# Patient Record
Sex: Female | Born: 1955
Health system: Southern US, Community
[De-identification: ages and names within clinical notes are randomized; demographics above are authoritative.]

## PROBLEM LIST (undated history)

## (undated) HISTORY — PX: APPENDECTOMY: SHX54

---

## 1999-04-14 ENCOUNTER — Encounter: Payer: Self-pay | Admitting: Family Medicine

## 1999-04-14 ENCOUNTER — Ambulatory Visit (HOSPITAL_COMMUNITY): Admission: RE | Admit: 1999-04-14 | Discharge: 1999-04-14 | Payer: Self-pay | Admitting: Family Medicine

## 1999-04-26 ENCOUNTER — Other Ambulatory Visit: Admission: RE | Admit: 1999-04-26 | Discharge: 1999-04-26 | Payer: Self-pay | Admitting: Gynecology

## 2000-04-25 ENCOUNTER — Other Ambulatory Visit: Admission: RE | Admit: 2000-04-25 | Discharge: 2000-04-25 | Payer: Self-pay | Admitting: Gynecology

## 2000-05-11 ENCOUNTER — Ambulatory Visit (HOSPITAL_COMMUNITY): Admission: RE | Admit: 2000-05-11 | Discharge: 2000-05-11 | Payer: Self-pay | Admitting: Family Medicine

## 2000-05-11 ENCOUNTER — Encounter: Payer: Self-pay | Admitting: Family Medicine

## 2000-05-16 ENCOUNTER — Encounter: Payer: Self-pay | Admitting: Family Medicine

## 2000-05-16 ENCOUNTER — Encounter: Admission: RE | Admit: 2000-05-16 | Discharge: 2000-05-16 | Payer: Self-pay | Admitting: Family Medicine

## 2000-11-13 ENCOUNTER — Encounter: Payer: Self-pay | Admitting: Family Medicine

## 2000-11-13 ENCOUNTER — Encounter: Admission: RE | Admit: 2000-11-13 | Discharge: 2000-11-13 | Payer: Self-pay | Admitting: Family Medicine

## 2000-11-26 ENCOUNTER — Other Ambulatory Visit: Admission: RE | Admit: 2000-11-26 | Discharge: 2000-11-26 | Payer: Self-pay | Admitting: Gynecology

## 2001-04-29 ENCOUNTER — Other Ambulatory Visit: Admission: RE | Admit: 2001-04-29 | Discharge: 2001-04-29 | Payer: Self-pay | Admitting: Gynecology

## 2002-04-28 ENCOUNTER — Other Ambulatory Visit: Admission: RE | Admit: 2002-04-28 | Discharge: 2002-04-28 | Payer: Self-pay | Admitting: Gynecology

## 2002-12-19 ENCOUNTER — Encounter: Payer: Self-pay | Admitting: Family Medicine

## 2002-12-19 ENCOUNTER — Ambulatory Visit (HOSPITAL_COMMUNITY): Admission: RE | Admit: 2002-12-19 | Discharge: 2002-12-19 | Payer: Self-pay | Admitting: Family Medicine

## 2003-06-01 ENCOUNTER — Other Ambulatory Visit: Admission: RE | Admit: 2003-06-01 | Discharge: 2003-06-01 | Payer: Self-pay | Admitting: Gynecology

## 2004-01-13 ENCOUNTER — Ambulatory Visit (HOSPITAL_COMMUNITY): Admission: RE | Admit: 2004-01-13 | Discharge: 2004-01-13 | Payer: Self-pay | Admitting: Gynecology

## 2004-06-06 ENCOUNTER — Other Ambulatory Visit: Admission: RE | Admit: 2004-06-06 | Discharge: 2004-06-06 | Payer: Self-pay | Admitting: Gynecology

## 2005-02-20 ENCOUNTER — Ambulatory Visit (HOSPITAL_COMMUNITY): Admission: RE | Admit: 2005-02-20 | Discharge: 2005-02-20 | Payer: Self-pay | Admitting: Family Medicine

## 2005-07-18 ENCOUNTER — Other Ambulatory Visit: Admission: RE | Admit: 2005-07-18 | Discharge: 2005-07-18 | Payer: Self-pay | Admitting: Gynecology

## 2006-02-23 ENCOUNTER — Encounter: Admission: RE | Admit: 2006-02-23 | Discharge: 2006-02-23 | Payer: Self-pay | Admitting: Family Medicine

## 2006-08-28 ENCOUNTER — Other Ambulatory Visit: Admission: RE | Admit: 2006-08-28 | Discharge: 2006-08-28 | Payer: Self-pay | Admitting: Gynecology

## 2007-02-25 ENCOUNTER — Ambulatory Visit (HOSPITAL_COMMUNITY): Admission: RE | Admit: 2007-02-25 | Discharge: 2007-02-25 | Payer: Self-pay | Admitting: Gynecology

## 2007-03-01 ENCOUNTER — Encounter: Admission: RE | Admit: 2007-03-01 | Discharge: 2007-03-01 | Payer: Self-pay | Admitting: Gynecology

## 2007-09-26 ENCOUNTER — Other Ambulatory Visit: Admission: RE | Admit: 2007-09-26 | Discharge: 2007-09-26 | Payer: Self-pay | Admitting: Gynecology

## 2007-10-02 ENCOUNTER — Encounter: Admission: RE | Admit: 2007-10-02 | Discharge: 2007-10-02 | Payer: Self-pay | Admitting: Gynecology

## 2008-03-09 ENCOUNTER — Encounter: Admission: RE | Admit: 2008-03-09 | Discharge: 2008-03-09 | Payer: Self-pay | Admitting: Family Medicine

## 2009-03-12 ENCOUNTER — Encounter: Admission: RE | Admit: 2009-03-12 | Discharge: 2009-03-12 | Payer: Self-pay | Admitting: Family Medicine

## 2009-08-04 ENCOUNTER — Other Ambulatory Visit: Admission: RE | Admit: 2009-08-04 | Discharge: 2009-08-04 | Payer: Self-pay | Admitting: Family Medicine

## 2010-03-14 ENCOUNTER — Ambulatory Visit (HOSPITAL_COMMUNITY): Admission: RE | Admit: 2010-03-14 | Discharge: 2010-03-14 | Payer: Self-pay | Admitting: Gynecology

## 2011-09-26 ENCOUNTER — Encounter: Payer: Self-pay | Admitting: Sports Medicine

## 2011-09-26 ENCOUNTER — Ambulatory Visit (INDEPENDENT_AMBULATORY_CARE_PROVIDER_SITE_OTHER): Payer: BC Managed Care – PPO | Admitting: Sports Medicine

## 2011-09-26 VITALS — BP 118/77 | HR 78 | Ht 66.5 in | Wt 138.0 lb

## 2011-09-26 DIAGNOSIS — M25579 Pain in unspecified ankle and joints of unspecified foot: Secondary | ICD-10-CM

## 2011-09-26 NOTE — Progress Notes (Signed)
  Subjective:    Patient ID: Annette Kidd, female    DOB: Jul 02, 1955, 56 y.o.   MRN: [id number]  HPI Patient is active and likes to hike and do garden work. She is doing yoga for a number of years. A few years ago she had plantar fasciitis in the right foot but this did resolve with using a boot, stretching exercises and a number of different treatments. This took several months to resolve. Other than that she has had occasional right ankle injuries with some swelling but none that she thought were too serious.  Lately she has pain that radiates down the outside of her right leg particularly around the outside of her ankle and the outside of her foot. It is not associated with numbness or tingling. It does get worse after significant walking or hiking with a backpack or working with a shovel.  She does not note much swelling.   Review of Systems     Objective:   Physical Exam  No acute distress  RT Ankle: No visible erythema or swelling. Range of motion is full in all directions but is increased in both inversion and plantar flexion Strength is 5/5 in all directions. Unstable lateral joint capsule although the ATF is normal  Stable medial ligaments; squeeze test and kleiger test unremarkable; Talar dome nontender;  No pain at base of 5th MT; No tenderness over cuboid; No tenderness over N spot or navicular prominence No tenderness on posterior aspects of lateral and medial malleolus No sign of peroneal tendon subluxations; Negative tarsal tunnel tinel's Able to walk 4 steps.       Assessment & Plan:

## 2011-09-26 NOTE — Patient Instructions (Signed)
Use ankle compression sleeve for walking, prolonged standing and yoga  Get some new walking shoes and get rid of worn boot  Start ankle stabilization exercises daily - twice if you get time  You are getting chronic stretch of peroneal tendons and of the peroneal nerve that causes your pain and symptoms to your outer foot and leg  Work this daily for 4 to 6 weeks and then let me recheck to see if ankle is more stable

## 2011-09-26 NOTE — Assessment & Plan Note (Signed)
Patient has an unstable right ankle but I think gives her a strain of the perineal muscles and perhaps some traction irritation to the peroneal nerves  We gave her a compression sleeve in a series of exercises  I want her to improve her shoe wear  Recheck in 4-6 weeks

## 2011-11-14 ENCOUNTER — Ambulatory Visit (INDEPENDENT_AMBULATORY_CARE_PROVIDER_SITE_OTHER): Payer: BC Managed Care – PPO | Admitting: Sports Medicine

## 2011-11-14 VITALS — BP 135/74

## 2011-11-14 DIAGNOSIS — M25579 Pain in unspecified ankle and joints of unspecified foot: Secondary | ICD-10-CM

## 2011-11-14 NOTE — Assessment & Plan Note (Signed)
This is clearly improved  However I think she should use some ankle support long term but only for vigorous exercise  keep up exercises 3 x week She can see me prn  Copy to Dr Clelia Croft

## 2011-11-14 NOTE — Progress Notes (Signed)
  Subjective:    Patient ID: Annette Kidd, female    DOB: 01-Jun-1955, 56 y.o.   MRN: [id number]  HPI Very little pain, does exercises 2x/day for while, less more recently Has continued to walk daily Able to hike, walk and do yoga using compression sleeve with very little pain Feels that balance has improved with eyes closed   Review of Systems     Objective:   Physical Exam RT ankle No swelling, no direct tenderness, still has 25 degrees of excess inversion of right ankle, but normal dorsal flexion, plantar flexion, and eversion Right outer talar dome comes out of mortise with inversin and PF Drawer test is negative     Assessment & Plan:  Continue balance exercises at least 3x per week Use compression sleeve during activity

## 2012-03-12 ENCOUNTER — Ambulatory Visit: Payer: BC Managed Care – PPO | Admitting: Sports Medicine

## 2012-05-24 ENCOUNTER — Ambulatory Visit (INDEPENDENT_AMBULATORY_CARE_PROVIDER_SITE_OTHER): Payer: Self-pay | Admitting: General Surgery

## 2012-09-17 ENCOUNTER — Ambulatory Visit (INDEPENDENT_AMBULATORY_CARE_PROVIDER_SITE_OTHER): Payer: Managed Care, Other (non HMO) | Admitting: Sports Medicine

## 2012-09-17 ENCOUNTER — Encounter: Payer: Self-pay | Admitting: Sports Medicine

## 2012-09-17 VITALS — BP 117/70 | HR 82 | Ht 66.5 in | Wt 138.0 lb

## 2012-09-17 DIAGNOSIS — M2170 Unequal limb length (acquired), unspecified site: Secondary | ICD-10-CM

## 2012-09-17 DIAGNOSIS — M25571 Pain in right ankle and joints of right foot: Secondary | ICD-10-CM

## 2012-09-17 DIAGNOSIS — M25579 Pain in unspecified ankle and joints of unspecified foot: Secondary | ICD-10-CM

## 2012-09-17 NOTE — Assessment & Plan Note (Signed)
RT ankle laxity is still increased on RT but less than noted before Restart ankle exercises  Early arch breakdown with fibromas on RT PF  Suspect this is worse on RT 2/2 short leg  Insoles, arch straps, PF exercises

## 2012-09-17 NOTE — Patient Instructions (Addendum)
Please do suggested exercises for your ankles and feet  Try arch bands and insoles with leg length correction  Please follow up in 4-6 weeks  Thank you for seeing [place] today!

## 2012-09-17 NOTE — Progress Notes (Signed)
  Subjective:    Patient ID: Annette Kidd, female    DOB: [birth year], 57 y.o.   MRN: [id number]  HPI  Pt presents to clinic for rt lateral foot and heel pain. Has lump in arches of both feet, lt foot morton's neuroma that feels "weird", not painful.  Also having rt posterior hip pain down back of leg.  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 States discomfort is constant, worse with increased activity.  Doing plantar fasciitis stretches and rolling foot on frozen water bottle.    Review of Systems     Objective:   Physical Exam  NAD  Plantar fibroma medial aspect on rt Thickening of PF on lt  Cavus foot, loss long arch bilat Hypertrophy of MTP joint with small bunionettes Calcaneus neutral bilat Post tib function good bilat  Calves are symmetric  Lt leg 1.5 cm shorter than rt  Straight leg raise neg bilat- tighter on rt FABER good bilat SI joints move freely  Medial aspect of PF on rt - not ttp  Herniations of fat pad on rt heel L 5-S1 strength normal Rt ankle looser than lt  Peroneal strength good bilat Hip abduction strength good bilat  No scoliosis   Walking gait with trendelenberg to left  Walking gait- trendelenburg to the left      Assessment & Plan:

## 2012-09-17 NOTE — Assessment & Plan Note (Signed)
Lift added to RT  Gait pattern was improved P this  Trial with support for 1 month  If helpful start with lift in other shoes Use the insoles with lift for walking, exercise  Reck 6 weeks

## 2012-10-22 ENCOUNTER — Ambulatory Visit: Payer: Managed Care, Other (non HMO) | Admitting: Sports Medicine

## 2012-11-06 ENCOUNTER — Ambulatory Visit (INDEPENDENT_AMBULATORY_CARE_PROVIDER_SITE_OTHER): Payer: Managed Care, Other (non HMO) | Admitting: Sports Medicine

## 2012-11-06 VITALS — BP 126/77 | Ht 66.0 in | Wt 138.0 lb

## 2012-11-06 DIAGNOSIS — M2170 Unequal limb length (acquired), unspecified site: Secondary | ICD-10-CM

## 2012-11-06 DIAGNOSIS — M25579 Pain in unspecified ankle and joints of unspecified foot: Secondary | ICD-10-CM

## 2012-11-06 DIAGNOSIS — M25571 Pain in right ankle and joints of right foot: Secondary | ICD-10-CM

## 2012-11-06 NOTE — Progress Notes (Signed)
  Subjective:    Patient ID: Annette Kidd, female    DOB: 05/25/1955, 57 y.o.   MRN: [id number]  HPI  Pt presents to clinic for of rt lateral foot and and heel pain which is slightly improved. Has been doing home exercises regularly.- cone touches, one legged balance, ABC motion.  Injured rt 5th toe since last visit- jammed into a pool chair.  Had to discontinue some home exercises due to toe pain. Uses bodyhelix ankle sleeve when doing home exercises.  Heel lift on left has been comfortable.  Pain has radiated down the outside of the right ankle down to the base of the fifth metatarsal  Review of Systems     Objective:   Physical Exam Thin female in no acute distress  20-25 degrees of increased laxitiy of rt ankle Leg length inequality noted - left shorter than rt   Rt foot Plantar fibromas  - medial mid arch Area of pain is base of 5th   Plantar fibromas larger on rt than lt  Good peroneal strength bilat- pain along peroneals on the rt with testing  Good rt hip abduction strength  Walking gait- trendelenburg gait almost completely corrected      Assessment & Plan:

## 2012-11-06 NOTE — Assessment & Plan Note (Signed)
Only exercises for the next 3 months  If pain is gradually decreasing keep with this strategy  Wear her compression sleeve more because much of the time of the day she is on her feet and irregular surfaces

## 2012-11-06 NOTE — Patient Instructions (Addendum)
Use ankle sleeve when you are going to be doing significant walking.  Wear it as tolerated during the day  Continue with current ankle exercises  Add some heel raises with toes forward, turned in and turned out and with toes facing forward with knees bent 20 degrees  Please follow up in 3 months  Thank you for seeing [place] today!

## 2012-11-06 NOTE — Assessment & Plan Note (Signed)
Continue to use a lift in all of her shoes  This has significantly improved her gait  Recheck after 3 months and see if she has better right ankle stability

## 2013-06-03 ENCOUNTER — Other Ambulatory Visit: Payer: Self-pay | Admitting: Physician Assistant

## 2013-06-03 ENCOUNTER — Ambulatory Visit
Admission: RE | Admit: 2013-06-03 | Discharge: 2013-06-03 | Disposition: A | Discharge status: Home / Self Care | Payer: Managed Care, Other (non HMO) | Source: Ambulatory Visit | Attending: Physician Assistant | Admitting: Physician Assistant

## 2013-06-03 DIAGNOSIS — R52 Pain, unspecified: Secondary | ICD-10-CM

## 2013-06-03 DIAGNOSIS — R609 Edema, unspecified: Secondary | ICD-10-CM

## 2013-08-06 ENCOUNTER — Ambulatory Visit (INDEPENDENT_AMBULATORY_CARE_PROVIDER_SITE_OTHER): Payer: Managed Care, Other (non HMO) | Admitting: Sports Medicine

## 2013-08-06 ENCOUNTER — Encounter: Payer: Self-pay | Admitting: Sports Medicine

## 2013-08-06 VITALS — BP 123/83 | HR 84 | Ht 66.0 in | Wt 138.0 lb

## 2013-08-06 DIAGNOSIS — M25579 Pain in unspecified ankle and joints of unspecified foot: Secondary | ICD-10-CM

## 2013-08-06 DIAGNOSIS — M2170 Unequal limb length (acquired), unspecified site: Secondary | ICD-10-CM

## 2013-08-06 DIAGNOSIS — M79609 Pain in unspecified limb: Secondary | ICD-10-CM

## 2013-08-06 DIAGNOSIS — M79641 Pain in right hand: Secondary | ICD-10-CM

## 2013-08-06 NOTE — Assessment & Plan Note (Signed)
Cont using lifts as before

## 2013-08-06 NOTE — Assessment & Plan Note (Addendum)
Cont HEP with balance  Use compression wrap for trails We will give new insoles today to use in hiking boots

## 2013-08-06 NOTE — Progress Notes (Signed)
Patient ID: Annette Kidd P Bishop, female   DOB: 06/22/1955, 58 y.o.   MRN: [id number]  Patient comes back in followup of foot and ankle pain [place] Will [place]  Much improved foot and ankle pain with correction of leg length with lift on left  Now with some lateral 5th ray pain on RT longer leg  RT ankle feels better after balance exercises Not using compression that often  Brings boots for hiking to see if we can help pad these  Also worried about shoulder pain that is mild but she works in gardening  PEXAM  NAD BP 123/83  Pulse 84  Ht 5\' 6"  (1.676 m)  Wt 138 lb (62.596 kg)  BMI 22.28 kg/m2  RT ankle demonstrates increased laxity on PF and inversion vs LT Not tender Better than on last visit  RT 5th ray is somewhat hypermobile  Feet Cavus foot, loss long arch bilat  Hypertrophy of MTP joint with small bunionettes  Calcaneus neutral bilat  Post tib function good bilat   Plantar fibromas noted Also has fibroma over flexor tendon RT index finger Piezogenic papules  Shoulder: Inspection reveals no abnormalities, atrophy or asymmetry. Palpation is normal with no tenderness over AC joint or bicipital groove. ROM is full in all planes. Rotator cuff strength normal throughout. No signs of impingement with negative Neer and Hawkin's tests, empty can. Speeds and Yergason's tests normal. No labral pathology noted with negative Obrien's, negative clunk and good stability. Normal scapular function observed. No painful arc and no drop arm sign. No apprehension sign

## 2013-08-06 NOTE — Assessment & Plan Note (Signed)
Reassured Use some grip and hot water exercises  For shoulder keep good ROM  Strength is good  Avoid behind neck exercises

## 2014-04-20 ENCOUNTER — Other Ambulatory Visit: Payer: Self-pay | Admitting: Gynecology

## 2014-04-21 LAB — CYTOLOGY - PAP

## 2015-06-15 ENCOUNTER — Encounter: Payer: Self-pay | Admitting: Sports Medicine

## 2015-06-15 ENCOUNTER — Ambulatory Visit (INDEPENDENT_AMBULATORY_CARE_PROVIDER_SITE_OTHER): Payer: Managed Care, Other (non HMO) | Admitting: Sports Medicine

## 2015-06-15 VITALS — BP 143/80 | Ht 66.0 in | Wt 138.0 lb

## 2015-06-15 DIAGNOSIS — M23322 Other meniscus derangements, posterior horn of medial meniscus, left knee: Secondary | ICD-10-CM | POA: Diagnosis not present

## 2015-06-15 DIAGNOSIS — M23329 Other meniscus derangements, posterior horn of medial meniscus, unspecified knee: Secondary | ICD-10-CM | POA: Insufficient documentation

## 2015-06-15 NOTE — Progress Notes (Signed)
  Subjective:    CC: L knee pain  HPI: [name] is a 60 YO woman who presents today with L knee pain for the past 6 months. Patient is an avid hiker and notes that her L knee started to hurt after a short hike. Pain is in the posterior-medial aspect of the knee and does not radiate to the calf or thigh. Worse with deep knee bends and lunges. Pain is 0/10 at rest and can escalate to a 4-5/10 with deep squats. Patient stays active with yoga, stretching, and hiking. Has been consistently wearing her green insoles which help correct her leg-length discrepancy. Denies popping, locking, catching, or instability of the knee. No prior history of knee injury.   Past medical history, Surgical history, Family history not pertinant except as noted below, Social history, Allergies, and medications have been entered into the medical record, reviewed, and no changes needed.  Is a gardener, stays active with yoga and walking, is a non-smoker.  Review of Systems: No fevers, chills, or shortness of breath., no genralized joint swelling/ no numbness  Objective:    General: Well Developed, well nourished, and in no acute distress.  Neuro: Alert and oriented x3, extra-ocular muscles intact, sensation grossly intact.  HEENT: Normocephalic, atraumatic, pupils equal round reactive to light Skin: Warm and dry, no rashes.  L knee: No erythema or effusion noted. Tenderness to palpation along the posterior medial joint line. No TTP along the lateral joint line, patella, patellar tendon, quadriceps tendon, or tibial tuberosity. Full ROM with extension and flexion. No pain or increased laxity with valgus or varus stress. 5/5 strength with flexion and extension. Positive Thessaly's, negative anterior or posterior drawer.  Neurovascularly intact.  [place] L Knee: Small degenerative split of the medial meniscus, best visualized posteriorly.  Small calcification of the mid medial meniscus with slt swelling No suprapatellar  effusion noted.  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  - Recommended quad strengthening exercises with leg lifts, leg abduction, posterior leg raises, hamstring curls, and short arc raises. Ankle weights as tolerated. - Compression sleeve for additional support - Recommend using stationary bike - PRN follow up in 2 mos

## 2015-06-15 NOTE — Patient Instructions (Addendum)
Recommend stationary bike for activity - spin class okay as long as you don't stand.  Recommend compression sleeve for additional stability.  Exercises - work up to 3 sets of 10 1-2 times daily. Use ankle weight with all exercises. 1.) Straight leg raise while lying on back  2.) Straight leg raise while lying on side  3.) Lying on stomach - leg lifts  4.) Lying on stomach - hamstring curl  5.) Short arc raises - while sitting on chair, extend leg to 45 degrees. Bring leg to full extension, then return to 45 degrees.

## 2015-06-15 NOTE — Assessment & Plan Note (Signed)
See notes:  HEP Compression Conservative care Watch deeper knee bend biking

## 2015-07-01 ENCOUNTER — Encounter: Payer: Self-pay | Admitting: Sports Medicine

## 2015-08-19 ENCOUNTER — Encounter: Payer: Self-pay | Admitting: Sports Medicine

## 2015-08-19 ENCOUNTER — Ambulatory Visit (INDEPENDENT_AMBULATORY_CARE_PROVIDER_SITE_OTHER): Payer: Managed Care, Other (non HMO) | Admitting: Sports Medicine

## 2015-08-19 VITALS — BP 152/79 | Ht 66.0 in | Wt 138.0 lb

## 2015-08-19 DIAGNOSIS — M629 Disorder of muscle, unspecified: Secondary | ICD-10-CM

## 2015-08-19 DIAGNOSIS — M23322 Other meniscus derangements, posterior horn of medial meniscus, left knee: Secondary | ICD-10-CM | POA: Diagnosis not present

## 2015-08-19 DIAGNOSIS — M25572 Pain in left ankle and joints of left foot: Secondary | ICD-10-CM | POA: Diagnosis not present

## 2015-08-19 DIAGNOSIS — S93699A Other sprain of unspecified foot, initial encounter: Secondary | ICD-10-CM

## 2015-08-19 NOTE — Assessment & Plan Note (Addendum)
Pt. With some continued discomfort of her left knee. Avoiding hyperflexion, she is doing the strengthening exercises.  - Continue strengthening.  - continue icing as needed.  - Follow up as needed in the future.  Use compression sleeve prn for swelling or pain - No significant mechanical / functional impairment at this point.

## 2015-08-19 NOTE — Progress Notes (Signed)
[facility] Yolande Jollyaleb G Sharbel Sahagun, MD Phone: [phone]  Subjective:   # Left Heel Pain - has developed over the past week.  - Says she is not sure if it is plantar fasciitis or not.  - Feels like the bone is impacting the ground when she walks.  - Pain is in the middle / medial side of her heel.  - Very painful to walk without shoes.  - No ankle instability.  - No redness or swelling.  - No toe pain or mid-foot pain.  - No other medical problems.  - No calf weakness or pain.  - Has not had any achilles pain.  - Has been wearing her sports insoles, and tried to cut a hole in one of them to see if taking pressure off her heel would help. - Otherwise has not has much success with stretching or resting in terms of pain relief.  - Has not had trauma to the area.  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  - She wears running shoes and also hiking shoes for work.   # Left Knee Meniscus Tear - has been doing the exercises.  - Feels like her pain is somewhat improved.  - No instability.  - Having some popping  - symptoms mostly with flexion past 30-45 degrees.  - No swelling, redness, or joint tenderness. - Does not bother her throughout the day.  - she simply avoids over flexion or deep lunges.  - not requiring pain medicine.   All relevant systems were reviewed and were negative unless otherwise noted in the HPI  Past Medical History Reviewed problem list.  Medications- reviewed and updated Current Outpatient Prescriptions  Medication Sig Dispense Refill  . calcium carbonate (OS-CAL) 600 MG TABS Take 600 mg by mouth 2 (two) times daily with a meal.    . cholecalciferol (VITAMIN D) 1000 UNITS tablet Take 1,000 Units by mouth daily.    Marland Kitchen. FLUARIX QUADRIVALENT 0.5 ML injection TO BE ADMINISTERED BY PHARMACIST FOR IMMUNIZATION  0  . Multiple Vitamin (MULTIVITAMIN) capsule Take 1 capsule by mouth daily.     No current facility-administered  medications for this visit.   Chief complaint-noted No additions to family history Social history- patient is a non smoker  Objective: BP 152/79 mmHg  Ht 5\' 6"  (1.676 m)  Wt 138 lb (62.596 kg)  BMI 22.28 kg/m2  Thin F in NAD  Knee: Normal to inspection with no erythema or effusion or obvious bony abnormalities. Palpation normal with no warmth, joint line tenderness, patellar tenderness, or condyle tenderness. ROM limited in flexion to 120 degrees, extension and lower leg rotation normal. Ligaments with solid consistent endpoints including ACL, PCL, LCL, MCL. Positive Mcmurray's, Thessaly tests but only mild discomfort Non painful patellar compression. Patellar glide without crepitus. Patellar and quadriceps tendons unremarkable. Hamstring and quadriceps strength is normal.   Normal inspection with some visible fat pad atrophy, no visible swelling/erythema. Patient is tender at medial insertion of plantar fascia into calcaneus and toward the back of the heel.  Great toe motion: Excellent with 125 degrees of motion.  Arch shape: cavus.  Other foot breakdown: plantar foot breakdown bilaterally.   Diagnostic Ultrasound Evalution: [employer] E, MSK ultrasound, MSK probe Anatomy scanned: Plantar Fascia Bilaterally Indication: Pain Findings: Ruptured / Thickened plantar fascia at the calcaneal insertion on the left, 70mm. Some fluid / edema here. Edema of the fat pad. RT PF was about 53 MM  thick   Assessment/Plan: Rupture of plantar fascia Noted on ultrasound and consistent with history and exam.  - Ice it daily.  - NSAID's as needed for pain.  - Arch support strap given.  - Adjustment to orthotics made.  - pt. To perform plantar fascia stretches - handout given.  - No walking barefoot.  - F/U in 2 months.   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 With some continued discomfort of her left knee. Avoiding hyperflexion, she is doing the  strengthening exercises.  - Continue strengthening.  - continue icing as needed.  - Follow up as needed in the future.  - No significant mechanical / functional impairment at this point.

## 2015-08-19 NOTE — Assessment & Plan Note (Signed)
Noted on ultrasound and consistent with history and exam.  - Ice it daily.  - NSAID's as needed for pain.  - Arch support strap given.  - Adjustment to orthotics made.  - pt. To perform plantar fascia stretches - handout given.  - No walking barefoot.  - F/U in 2 months.

## 2016-02-22 ENCOUNTER — Ambulatory Visit (INDEPENDENT_AMBULATORY_CARE_PROVIDER_SITE_OTHER): Payer: Managed Care, Other (non HMO) | Admitting: Sports Medicine

## 2016-02-22 ENCOUNTER — Encounter: Payer: Self-pay | Admitting: Sports Medicine

## 2016-02-22 DIAGNOSIS — M23322 Other meniscus derangements, posterior horn of medial meniscus, left knee: Secondary | ICD-10-CM

## 2016-02-22 DIAGNOSIS — M79641 Pain in right hand: Secondary | ICD-10-CM

## 2016-02-22 NOTE — Progress Notes (Signed)
   Subjective:    Patient ID: Annette Kidd, female    DOB: [birth year], 60 y.o.   MRN: [id number]  HPI   Annette Kidd is 60 y.o. female with degenerative tear of left medial meniscus who presents for follow up of left knee and for new right thumb pain.   Left Knee: Reports pain has been steadily improving since last office visit. She occasionally has left medial posterior knee pain with certain rotational movements during yoga classes. She has been biking and hiking without difficulty. She stopped doing her knee exercises while rehabbing her plantar fascia this summer. No weakness of left lower extremity. No knee edema or erythema.   Right Thumb Pain: Present for the past one month. Seen by PCP and told she had trigger thumb. Attempted to take a course of NSAIDs but those irritated her stomach. Thumb pain is worsened with movements such as clipping in the garden, using handle bar brakes, etc.    Review of Systems: Per HPI.      Objective:   Physical Exam  Gen: Pleasant female in NAD  Knees: No TTP over left patella or joint lines. Full ROM of left knee. Ligaments of left knee stable. Negative Thessaly's. Good hip strength bilaterally.  Thumb: TTP and palpable nodule over right MCP joint. Triggering of thumb with flexion. Able to self extend thumb without manipulation.      Assessment & Plan:   Degenerative Tear of Posterior Horn of Medial Meniscus: Improving and healing well.  -continue strengthening and balance exercises  -follow up prn   Trigger Thumb: Exam consistent with trigger thumb likely from over-use activity.  -trial of conservative management for 1 month with massage with Aspercreme and ROM exercises for thumb -if pain persists or worsens in 1 month, return for US guided trigger point injection   I observed and examined the patient with the resident and agree with assessment and plan.  Note reviewed and modified by me.  Sterling BigKB Fields, MD    Marcy Sirenatherine Khiem Gargis,  D.O. 02/22/2016, 11:42 AM PGY-2, [facility]

## 2016-02-22 NOTE — Assessment & Plan Note (Signed)
This has done well w conservative care  No reason to limit activity at this point

## 2016-02-22 NOTE — Assessment & Plan Note (Signed)
Trigger thumb noted today Try topicals and massage Consider CSI

## 2016-05-17 ENCOUNTER — Encounter (HOSPITAL_COMMUNITY): Payer: Self-pay | Admitting: Emergency Medicine

## 2016-05-17 ENCOUNTER — Inpatient Hospital Stay (HOSPITAL_COMMUNITY)
Admission: EM | Admit: 2016-05-17 | Discharge: 2016-05-29 | DRG: 988 | Disposition: A | Discharge status: Home / Self Care | Payer: Managed Care, Other (non HMO) | Attending: General Surgery | Admitting: General Surgery

## 2016-05-17 ENCOUNTER — Emergency Department (HOSPITAL_COMMUNITY): Payer: Managed Care, Other (non HMO)

## 2016-05-17 DIAGNOSIS — S0240CA Maxillary fracture, right side, initial encounter for closed fracture: Secondary | ICD-10-CM | POA: Diagnosis present

## 2016-05-17 DIAGNOSIS — S098XXA Other specified injuries of head, initial encounter: Secondary | ICD-10-CM | POA: Diagnosis present

## 2016-05-17 DIAGNOSIS — R51 Headache: Secondary | ICD-10-CM | POA: Diagnosis present

## 2016-05-17 DIAGNOSIS — K011 Impacted teeth: Secondary | ICD-10-CM | POA: Diagnosis present

## 2016-05-17 DIAGNOSIS — S0181XA Laceration without foreign body of other part of head, initial encounter: Secondary | ICD-10-CM | POA: Diagnosis present

## 2016-05-17 DIAGNOSIS — R413 Other amnesia: Secondary | ICD-10-CM | POA: Diagnosis present

## 2016-05-17 DIAGNOSIS — R1310 Dysphagia, unspecified: Secondary | ICD-10-CM | POA: Diagnosis present

## 2016-05-17 DIAGNOSIS — W208XXA Other cause of strike by thrown, projected or falling object, initial encounter: Secondary | ICD-10-CM | POA: Diagnosis present

## 2016-05-17 DIAGNOSIS — S0993XA Unspecified injury of face, initial encounter: Secondary | ICD-10-CM

## 2016-05-17 DIAGNOSIS — S069XAA Unspecified intracranial injury with loss of consciousness status unknown, initial encounter: Secondary | ICD-10-CM | POA: Diagnosis present

## 2016-05-17 DIAGNOSIS — Y93K1 Activity, walking an animal: Secondary | ICD-10-CM

## 2016-05-17 DIAGNOSIS — S069X9A Unspecified intracranial injury with loss of consciousness of unspecified duration, initial encounter: Secondary | ICD-10-CM | POA: Diagnosis present

## 2016-05-17 DIAGNOSIS — S022XXB Fracture of nasal bones, initial encounter for open fracture: Secondary | ICD-10-CM | POA: Diagnosis present

## 2016-05-17 DIAGNOSIS — R40242 Glasgow coma scale score 9-12, unspecified time: Secondary | ICD-10-CM | POA: Diagnosis present

## 2016-05-17 DIAGNOSIS — E222 Syndrome of inappropriate secretion of antidiuretic hormone: Secondary | ICD-10-CM | POA: Diagnosis present

## 2016-05-17 DIAGNOSIS — S065X1A Traumatic subdural hemorrhage with loss of consciousness of 30 minutes or less, initial encounter: Secondary | ICD-10-CM | POA: Diagnosis present

## 2016-05-17 DIAGNOSIS — S069X1A Unspecified intracranial injury with loss of consciousness of 30 minutes or less, initial encounter: Secondary | ICD-10-CM

## 2016-05-17 DIAGNOSIS — K5900 Constipation, unspecified: Secondary | ICD-10-CM | POA: Diagnosis not present

## 2016-05-17 LAB — PREPARE FRESH FROZEN PLASMA
BLOOD PRODUCT EXPIRATION DATE: 201801142359
Blood Product Expiration Date: 201801172359
ISSUE DATE / TIME: 201801100955
ISSUE DATE / TIME: 201801100955
UNIT TYPE AND RH: 6200
Unit Type and Rh: 6200

## 2016-05-17 LAB — COMPREHENSIVE METABOLIC PANEL
ALBUMIN: 4.1 g/dL (ref 3.5–5.0)
ALK PHOS: 64 U/L (ref 38–126)
ALT: 20 U/L (ref 14–54)
AST: 28 U/L (ref 15–41)
Anion gap: 12 (ref 5–15)
BILIRUBIN TOTAL: 0.7 mg/dL (ref 0.3–1.2)
BUN: 9 mg/dL (ref 6–20)
CALCIUM: 9.5 mg/dL (ref 8.9–10.3)
CO2: 21 mmol/L — ABNORMAL LOW (ref 22–32)
Chloride: 100 mmol/L — ABNORMAL LOW (ref 101–111)
Creatinine, Ser: 0.62 mg/dL (ref 0.44–1.00)
GFR calc non Af Amer: >60 mL/min (ref >60)
GLUCOSE: 144 mg/dL — AB (ref 65–99)
POTASSIUM: 3.7 mmol/L (ref 3.5–5.1)
SODIUM: 133 mmol/L — AB (ref 135–145)
TOTAL PROTEIN: 6.6 g/dL (ref 6.5–8.1)

## 2016-05-17 LAB — CBC WITH DIFFERENTIAL/PLATELET
BASOS PCT: 0 %
Basophils Absolute: 0 10*3/uL (ref 0.0–0.1)
EOS ABS: 0.1 10*3/uL (ref 0.0–0.7)
Eosinophils Relative: 1 %
HEMATOCRIT: 38.7 % (ref 36.0–46.0)
HEMOGLOBIN: 13.3 g/dL (ref 12.0–15.0)
LYMPHS ABS: 2.8 10*3/uL (ref 0.7–4.0)
Lymphocytes Relative: 42 %
MCH: 31.8 pg (ref 26.0–34.0)
MCHC: 34.4 g/dL (ref 30.0–36.0)
MCV: 92.6 fL (ref 78.0–100.0)
MONO ABS: 0.7 10*3/uL (ref 0.1–1.0)
Monocytes Relative: 10 %
Neutro Abs: 3.1 10*3/uL (ref 1.7–7.7)
Neutrophils Relative %: 47 %
Platelets: 278 10*3/uL (ref 150–400)
RBC: 4.18 MIL/uL (ref 3.87–5.11)
RDW: 13.1 % (ref 11.5–15.5)
WBC: 6.6 10*3/uL (ref 4.0–10.5)

## 2016-05-17 LAB — CDS SEROLOGY

## 2016-05-17 MED ORDER — VITAMIN D 1000 UNITS PO TABS
1000.0000 [IU] | ORAL_TABLET | Freq: Every day | ORAL | Status: DC
  Administered 2016-05-18 – 2016-05-29 (×12): 1000 [IU] via ORAL
  Filled 2016-05-17 (×12): qty 1

## 2016-05-17 MED ORDER — ONDANSETRON HCL 4 MG/2ML IJ SOLN
4.0000 mg | Freq: Four times a day (QID) | INTRAMUSCULAR | Status: DC | PRN
  Administered 2016-05-18 – 2016-05-24 (×6): 4 mg via INTRAVENOUS
  Filled 2016-05-17 (×5): qty 2

## 2016-05-17 MED ORDER — FENTANYL CITRATE (PF) 100 MCG/2ML IJ SOLN
50.0000 ug | Freq: Once | INTRAMUSCULAR | Status: AC
  Administered 2016-05-17: 50 ug via INTRAVENOUS
  Filled 2016-05-17: qty 2

## 2016-05-17 MED ORDER — TETANUS-DIPHTH-ACELL PERTUSSIS 5-2.5-18.5 LF-MCG/0.5 IM SUSP
0.5000 mL | Freq: Once | INTRAMUSCULAR | Status: AC
  Administered 2016-05-17: 0.5 mL via INTRAMUSCULAR
  Filled 2016-05-17: qty 0.5

## 2016-05-17 MED ORDER — PANTOPRAZOLE SODIUM 40 MG PO TBEC
40.0000 mg | DELAYED_RELEASE_TABLET | Freq: Every day | ORAL | Status: DC
  Administered 2016-05-18 – 2016-05-29 (×12): 40 mg via ORAL
  Filled 2016-05-17 (×12): qty 1

## 2016-05-17 MED ORDER — CEFAZOLIN IN D5W 1 GM/50ML IV SOLN
1.0000 g | Freq: Three times a day (TID) | INTRAVENOUS | Status: DC
  Administered 2016-05-17 – 2016-05-25 (×23): 1 g via INTRAVENOUS
  Filled 2016-05-17 (×29): qty 50

## 2016-05-17 MED ORDER — ONDANSETRON HCL 4 MG/2ML IJ SOLN
4.0000 mg | Freq: Once | INTRAMUSCULAR | Status: AC
  Administered 2016-05-17: 4 mg via INTRAVENOUS
  Filled 2016-05-17: qty 2

## 2016-05-17 MED ORDER — PANTOPRAZOLE SODIUM 40 MG IV SOLR: 40.0000 mg | Freq: Every day | INTRAVENOUS | Status: DC

## 2016-05-17 MED ORDER — TRAMADOL HCL 50 MG PO TABS
50.0000 mg | ORAL_TABLET | Freq: Four times a day (QID) | ORAL | Status: DC | PRN
  Administered 2016-05-18: 50 mg via ORAL
  Administered 2016-05-18 – 2016-05-19 (×2): 100 mg via ORAL
  Administered 2016-05-20 – 2016-05-28 (×16): 50 mg via ORAL
  Filled 2016-05-17 (×2): qty 1
  Filled 2016-05-17: qty 2
  Filled 2016-05-17: qty 1
  Filled 2016-05-17 (×2): qty 2
  Filled 2016-05-17 (×14): qty 1

## 2016-05-17 MED ORDER — CALCIUM CARBONATE 1250 (500 CA) MG PO TABS
1.0000 | ORAL_TABLET | Freq: Every day | ORAL | Status: DC
  Administered 2016-05-18 – 2016-05-29 (×12): 500 mg via ORAL
  Filled 2016-05-17 (×13): qty 1

## 2016-05-17 MED ORDER — LIDOCAINE-EPINEPHRINE 1 %-1:100000 IJ SOLN
10.0000 mL | Freq: Once | INTRAMUSCULAR | Status: AC
  Administered 2016-05-17: 10 mL via INTRADERMAL
  Filled 2016-05-17: qty 10

## 2016-05-17 MED ORDER — SODIUM CHLORIDE 0.9 % IV SOLN
INTRAVENOUS | Status: AC | PRN
  Administered 2016-05-17: 500 mL via INTRAVENOUS

## 2016-05-17 MED ORDER — POTASSIUM CHLORIDE IN NACL 20-0.9 MEQ/L-% IV SOLN
INTRAVENOUS | Status: DC
  Administered 2016-05-17 – 2016-05-18 (×2): via INTRAVENOUS
  Filled 2016-05-17 (×3): qty 1000

## 2016-05-17 MED ORDER — MORPHINE SULFATE (PF) 4 MG/ML IV SOLN
2.0000 mg | INTRAVENOUS | Status: DC | PRN
  Administered 2016-05-17 – 2016-05-18 (×3): 2 mg via INTRAVENOUS
  Filled 2016-05-17 (×3): qty 1

## 2016-05-17 MED ORDER — POLYETHYLENE GLYCOL 3350 17 G PO PACK
17.0000 g | PACK | Freq: Every day | ORAL | Status: DC
  Administered 2016-05-19 – 2016-05-29 (×5): 17 g via ORAL
  Filled 2016-05-17 (×8): qty 1

## 2016-05-17 MED ORDER — BACITRACIN ZINC 500 UNIT/GM EX OINT
TOPICAL_OINTMENT | Freq: Three times a day (TID) | CUTANEOUS | Status: DC
  Administered 2016-05-17: 22:00:00 via TOPICAL
  Administered 2016-05-18: 1 via TOPICAL
  Administered 2016-05-18 – 2016-05-19 (×4): via TOPICAL
  Administered 2016-05-19: 1 via TOPICAL
  Administered 2016-05-20 – 2016-05-28 (×23): via TOPICAL
  Administered 2016-05-29: 1 via TOPICAL
  Filled 2016-05-17 (×2): qty 28.35

## 2016-05-17 MED ORDER — LIDOCAINE HCL (PF) 1 % IJ SOLN
INTRAMUSCULAR | Status: AC
  Filled 2016-05-17: qty 30

## 2016-05-17 MED ORDER — DOCUSATE SODIUM 100 MG PO CAPS
100.0000 mg | ORAL_CAPSULE | Freq: Two times a day (BID) | ORAL | Status: DC
  Administered 2016-05-18 – 2016-05-29 (×17): 100 mg via ORAL
  Filled 2016-05-17 (×19): qty 1

## 2016-05-17 MED ORDER — ONDANSETRON HCL 4 MG PO TABS
4.0000 mg | ORAL_TABLET | Freq: Four times a day (QID) | ORAL | Status: DC | PRN
  Administered 2016-05-19: 4 mg via ORAL
  Filled 2016-05-17: qty 1

## 2016-05-17 NOTE — Procedures (Signed)
Preop Dx: multiple facial lacerations 2/2 blow to face from tree branch.  Postop Dx: s/p laceration repairs.  Procedures: 1. Repair of superficial 1.5cm laceration in right nasolacrimal groove 2. Repair of complex/deep 3cm oblique laceration of the nasal dorsum. 3. Repair of superficial 0.5cm laceration in the midline of the upper philtrum area.  Surgeon: Vivia EwingJustin Vita Currin, DMD  Procedure: The patient was anesthetized with 10cc of 2% lidocaine with 1:100,000 epi in the dorsal, right lateral nose, and philtrum areas. The wounds were thoroughly cleansed with saline and hydrogen peroxide solution. The right nasolacrimal groove laceration was reapproximated and repaired with 5-0 prolene sutures x2. The nasal dorsum laceration with first reapproximated with multiple deep subcutaneous 3-0 vicryl sutures. The skin was then reapproximated with multiple 5-0 prolene sutures x8. The mid-philtrum laceration was reapproximated with a 5-0 prolene sutures x2. Following repairs, the wounds were cleansed again and a thin coat of Bacitracin ointment was applied. The patient tolerated the procedure well.  Complications: None  EBL: <5cc  Disposition: 1. Patient admitted for overnight observation per neurosurgery 2. Plan to remove sutures in 7-10 days. 3. Please see Oral & Maxillofacial Surgery Consult note for recommendations, wound care, and follow up information.

## 2016-05-17 NOTE — Consult Note (Signed)
Reason for Consult: nasal fractures, nasal lacerations Referring Physician: ED  Annette Kidd is an 61 y.o. female.  HPI: Patient unable to provide history due to amnesia to the event. She was brought in as a level I trauma. According to EMS, patient was walking her dog. People nearby workers and branches from surgery in one of the branches fell and hit her in the face. On EMS arrival, patient does not remember what had happened to her. She has had repetitive questions since then. He was placed in c-collar and subsequently brought to the ED for evaluation. Complains of facial pain and dental pain on arrival. Denies dyspnea or dysphagia.  History reviewed. No pertinent past medical history.  Past Surgical History:  Procedure Laterality Date  . APPENDECTOMY      No family history on file.  Social History:  has no tobacco, alcohol, and drug history on file.  Allergies: No Known Allergies  Medications: I have reviewed the patient's current medications.   ROS : Other than history of present illness negative.  Blood pressure 133/75, pulse 93, temperature 99.6 F (37.6 C), temperature source Oral, resp. rate 14, SpO2 98 %. Physical Exam  Gen.: Patient is awake and alert, with the head of bed elevated resting comfortably. C-collar in place. Maxillofacial exam: PERRL, EOMI; severe edema in the periorbital and nasal areas with bilateral ecchymosis inferior orbital areas. There is a 2 cm superficial laceration on the lateral aspect of the nasal bridge, this is on the right side in the nasal lacrimal groove. There is a 4 cm oblique deep laceration of the nasal bridge. There is also a small superficial laceration in the philtrum area that is approximately 0.5 cm. The orbital rims were palpated and feel intact as does the anterior maxillary sinus wall. Her occlusion is stable and reproducible. Her maxilla and mandible are stable upon manipulation. There is an oblique fracture of the right maxillary  central incisor. The tooth does not appear to be subluxated or intruded. No other extraoral or scalp lesions identified. Neuro: Cranial nerves V and VII appear to be intact bilaterally.  Head/Maxillofacial CT Scan: IMPRESSION: Acute left tentorial subdural hematoma is noted.  Comminuted and mildly displaced nasal bone fracture. Also noted is nondisplaced fracture involving the left lateral maxillary incisor.  Degenerative changes are noted in the cervical spine. No acute abnormality seen.   Assessment/Plan: 61 year old female with subdural hematoma, comminuted/minimally displaced bilateral nasal bone fractures, oblique fracture of the right maxillary central incisor.  Plan:  Neurosurgery to observe patient overnight for hematoma. We'll repair nasal lacerations at bedside. There is no acute surgical intervention warranted for her nasal bone fractures at this time. We'll reevaluate in approximately 1 week once edema has subsided. Recommended the patient to contact general dentist once discharged from the hospital for repair of right maxillary central incisor.  Recommendations: 1. Recommend antibiotic coverage with cephalosporin IV while inpatient, then convert to Keflex 500 mg 4 times daily 1 week. 2. Recommend cleansing of facial lacerations with a Q-tip and hydrogen peroxide 3-4 times daily, then coat with a thin layer of bacitracin ointment to keep moist. 3. We'll plan to remove sutures in approximately 1 week time. Patient will contact the oral surgery Center at [phone] to set up appointment.   Annette Kidd 05/17/2016, 6:30 PM

## 2016-05-17 NOTE — ED Notes (Signed)
Pt attempted to use bed pan

## 2016-05-17 NOTE — ED Notes (Signed)
Pt given water to drink. 

## 2016-05-17 NOTE — ED Triage Notes (Signed)
Per [facility], pt walking dog, people cutting branches, fell and hit her face. EMs says repetitive questions, pressures 100. Pt has swelling and bleeding to nose. Pt has C collar, answering questions appropriately. Does not remember accident.

## 2016-05-17 NOTE — ED Notes (Signed)
Pt to CT

## 2016-05-17 NOTE — ED Provider Notes (Signed)
[facility]-EMERGENCY DEPT Provider Note   CSN: [id number] Arrival date & time: 05/17/16  4098     History   Chief Complaint Chief Complaint  Patient presents with  . Facial Injury    HPI Annette Kidd is a 61 y.o. female.  HPI Patient unable to provide history due to amnesia to the event. She was brought in as a level I trauma. According to EMS, patient was walking her dog. People nearby workers and branches from surgery in one of the branches fell and hit her in the face. On EMS arrival, patient does not remember what had happened to her. She has had repetitive questions since then. He was placed in c-collar and subsequently brought to the ED for evaluation. Complains of facial pain and dental pain on arrival. She does not remember what happened, but reports the last thing she was doing was walking. Denies chest pain, neck pain, back pain, abdominal pain or extremity injury.   History reviewed. No pertinent past medical history.  Patient Active Problem List   Diagnosis Date Noted  . TBI (traumatic brain injury) (HCC) 05/17/2016    Past Surgical History:  Procedure Laterality Date  . APPENDECTOMY      OB History    No data available       Home Medications    Prior to Admission medications   Medication Sig Start Date End Date Taking? Authorizing Provider  calcium carbonate (OS-CAL - DOSED IN MG OF ELEMENTAL CALCIUM) 1250 (500 Ca) MG tablet Take 1 tablet by mouth daily with breakfast.   Yes Historical Provider, MD  cholecalciferol (VITAMIN D) 1000 units tablet Take 1,000 Units by mouth daily.   Yes Historical Provider, MD    Family History No family history on file. Reviewed, non-contributory  Social History Social History  Substance Use Topics  . Smoking status: Not on file  . Smokeless tobacco: Not on file  . Alcohol use Not on file     Allergies   Patient has no known allergies.   Review of Systems Review of Systems 10/14 systems reviewed and are negative  other than those stated in the HPI   Physical Exam Updated Vital Signs BP 132/80   Pulse 79   Temp 98.4 F (36.9 C) (Oral)   Resp 12   Ht 5\' 6"  (1.676 m)   Wt 138 lb 7.2 oz (62.8 kg)   SpO2 97%   BMI 22.35 kg/m   Physical Exam Physical Exam  Nursing note and vitals reviewed. Constitutional: tearful, but not toxic and in no acute distress Head: Normocephalic. Deep U-shaped laceration to bridge of nose, appearing through and throughout with deformity Nose: no appreciable septal hematoma but bilateral slow epistaxis present  Mouth/Throat: Oropharynx with some blood. Right upper cuspid appears impacted Neck: in cervical collar Cardiovascular: Normal rate and regular rhythm.  +2 peripheral pulses Pulmonary/Chest: Effort normal and breath sounds normal. no chest wall tenderness Abdominal: Soft. There is no tenderness. There is no rebound and no guarding.  Musculoskeletal: Normal range of motion.  Neurological: Alert, no facial droop, fluent speech, moves all extremities symmetrically Skin: Skin is warm and dry.  Psychiatric: Cooperative   ED Treatments / Results  Labs (all labs ordered are listed, but only abnormal results are displayed) Labs Reviewed  COMPREHENSIVE METABOLIC PANEL - Abnormal; Notable for the following:       Result Value   Sodium 133 (*)    Chloride 100 (*)    CO2 21 (*)    Glucose,  Bld 144 (*)    All other components within normal limits  CBC - Abnormal; Notable for the following:    WBC 12.6 (*)    RBC 3.68 (*)    Hemoglobin 11.6 (*)    HCT 33.2 (*)    All other components within normal limits  BASIC METABOLIC PANEL - Abnormal; Notable for the following:    Sodium 125 (*)    Chloride 96 (*)    Glucose, Bld 118 (*)    Calcium 8.5 (*)    All other components within normal limits  CBC WITH DIFFERENTIAL/PLATELET  CDS SEROLOGY  URINALYSIS, ROUTINE W REFLEX MICROSCOPIC  BASIC METABOLIC PANEL  PREPARE FRESH FROZEN PLASMA    EKG  EKG  Interpretation None       Radiology Ct Head Wo Contrast  Result Date: 05/17/2016 CLINICAL DATA:  [place] tree branch hit face. EXAM: CT HEAD WITHOUT CONTRAST CT MAXILLOFACIAL WITHOUT CONTRAST CT CERVICAL SPINE WITHOUT CONTRAST TECHNIQUE: Multidetector CT imaging of the head, cervical spine, and maxillofacial structures were performed using the standard protocol without intravenous contrast. Multiplanar CT image reconstructions of the cervical spine and maxillofacial structures were also generated. COMPARISON:  None. FINDINGS: CT HEAD FINDINGS Brain: Left tentorial subdural hematoma is noted. No mass effect or midline shift is noted. Ventricular size is within normal limits. There is no evidence of mass lesion or acute infarction. Vascular: No hyperdense vessel or unexpected calcification. Skull: Normal. Negative for fracture or focal lesion. Other: Small left occipital scalp hematoma is noted. CT MAXILLOFACIAL FINDINGS Osseous: Comminuted and mildly displaced fracture is seen involving the nasal bones. Nondisplaced fracture involving left lateral incisor of maxilla is noted. Orbits: Negative. No traumatic or inflammatory finding. Sinuses: Clear. Soft tissues: Negative. CT CERVICAL SPINE FINDINGS Alignment: Normal. Skull base and vertebrae: No acute fracture. No primary bone lesion or focal pathologic process. Soft tissues and spinal canal: No prevertebral fluid or swelling. No visible canal hematoma. Disc levels: Mild degenerative disc disease is noted at C5-6 with posterior osteophyte formation. Moderate degenerative disc disease is noted at C6-7. Upper chest: Negative. Other: None. IMPRESSION: Acute left tentorial subdural hematoma is noted. Comminuted and mildly displaced nasal bone fracture. Also noted is nondisplaced fracture involving the left lateral maxillary incisor. Degenerative changes are noted in the cervical spine. No acute abnormality seen. Critical Value/emergent results were called by  telephone at the time of interpretation on 05/17/2016 at 11:43 am to Dr. Crista Curb , who verbally acknowledged these results. Electronically Signed   By: Lupita Raider, M.D.   On: 05/17/2016 11:46   Ct Cervical Spine Wo Contrast  Result Date: 05/17/2016 CLINICAL DATA:  [place] tree branch hit face. EXAM: CT HEAD WITHOUT CONTRAST CT MAXILLOFACIAL WITHOUT CONTRAST CT CERVICAL SPINE WITHOUT CONTRAST TECHNIQUE: Multidetector CT imaging of the head, cervical spine, and maxillofacial structures were performed using the standard protocol without intravenous contrast. Multiplanar CT image reconstructions of the cervical spine and maxillofacial structures were also generated. COMPARISON:  None. FINDINGS: CT HEAD FINDINGS Brain: Left tentorial subdural hematoma is noted. No mass effect or midline shift is noted. Ventricular size is within normal limits. There is no evidence of mass lesion or acute infarction. Vascular: No hyperdense vessel or unexpected calcification. Skull: Normal. Negative for fracture or focal lesion. Other: Small left occipital scalp hematoma is noted. CT MAXILLOFACIAL FINDINGS Osseous: Comminuted and mildly displaced fracture is seen involving the nasal bones. Nondisplaced fracture involving left lateral incisor of maxilla is noted. Orbits: Negative. No traumatic or  inflammatory finding. Sinuses: Clear. Soft tissues: Negative. CT CERVICAL SPINE FINDINGS Alignment: Normal. Skull base and vertebrae: No acute fracture. No primary bone lesion or focal pathologic process. Soft tissues and spinal canal: No prevertebral fluid or swelling. No visible canal hematoma. Disc levels: Mild degenerative disc disease is noted at C5-6 with posterior osteophyte formation. Moderate degenerative disc disease is noted at C6-7. Upper chest: Negative. Other: None. IMPRESSION: Acute left tentorial subdural hematoma is noted. Comminuted and mildly displaced nasal bone fracture. Also noted is nondisplaced fracture involving  the left lateral maxillary incisor. Degenerative changes are noted in the cervical spine. No acute abnormality seen. Critical Value/emergent results were called by telephone at the time of interpretation on 05/17/2016 at 11:43 am to Dr. Crista CurbANA Parrish Daddario , who verbally acknowledged these results. Electronically Signed   By: Lupita RaiderJames  Green Jr, M.D.   On: 05/17/2016 11:46   Ct Maxillofacial Wo Contrast  Result Date: 05/17/2016 CLINICAL DATA:  [place] branch hit face. EXAM: CT HEAD WITHOUT CONTRAST CT MAXILLOFACIAL WITHOUT CONTRAST CT CERVICAL SPINE WITHOUT CONTRAST TECHNIQUE: Multidetector CT imaging of the head, cervical spine, and maxillofacial structures were performed using the standard protocol without intravenous contrast. Multiplanar CT image reconstructions of the cervical spine and maxillofacial structures were also generated. COMPARISON:  None. FINDINGS: CT HEAD FINDINGS Brain: Left tentorial subdural hematoma is noted. No mass effect or midline shift is noted. Ventricular size is within normal limits. There is no evidence of mass lesion or acute infarction. Vascular: No hyperdense vessel or unexpected calcification. Skull: Normal. Negative for fracture or focal lesion. Other: Small left occipital scalp hematoma is noted. CT MAXILLOFACIAL FINDINGS Osseous: Comminuted and mildly displaced fracture is seen involving the nasal bones. Nondisplaced fracture involving left lateral incisor of maxilla is noted. Orbits: Negative. No traumatic or inflammatory finding. Sinuses: Clear. Soft tissues: Negative. CT CERVICAL SPINE FINDINGS Alignment: Normal. Skull base and vertebrae: No acute fracture. No primary bone lesion or focal pathologic process. Soft tissues and spinal canal: No prevertebral fluid or swelling. No visible canal hematoma. Disc levels: Mild degenerative disc disease is noted at C5-6 with posterior osteophyte formation. Moderate degenerative disc disease is noted at C6-7. Upper chest: Negative. Other: None.  IMPRESSION: Acute left tentorial subdural hematoma is noted. Comminuted and mildly displaced nasal bone fracture. Also noted is nondisplaced fracture involving the left lateral maxillary incisor. Degenerative changes are noted in the cervical spine. No acute abnormality seen. Critical Value/emergent results were called by telephone at the time of interpretation on 05/17/2016 at 11:43 am to Dr. Crista CurbANA Deshanae Lindo , who verbally acknowledged these results. Electronically Signed   By: Lupita RaiderJames  Green Jr, M.D.   On: 05/17/2016 11:46    Procedures Procedures (including critical care time) CRITICAL CARE Performed by: Lavera Guiseana Duo Caitrin Pendergraph   Total critical care time: 45 minutes  Critical care time was exclusive of separately billable procedures and treating other patients.  Critical care was necessary to treat or prevent imminent or life-threatening deterioration.  Critical care was time spent personally by me on the following activities: development of treatment plan with patient and/or surrogate as well as nursing, discussions with consultants, evaluation of patient's response to treatment, examination of patient, obtaining history from patient or surrogate, ordering and performing treatments and interventions, ordering and review of laboratory studies, ordering and review of radiographic studies, pulse oximetry and re-evaluation of patient's condition.   Medications Ordered in ED Medications  calcium carbonate (OS-CAL - dosed in mg of elemental calcium) tablet 500 mg of elemental  calcium (500 mg of elemental calcium Oral Given 05/18/16 0733)  cholecalciferol (VITAMIN D) tablet 1,000 Units (not administered)  0.9 % NaCl with KCl 20 mEq/ L  infusion ( Intravenous Rate/Dose Verify 05/18/16 0900)  traMADol (ULTRAM) tablet 50-100 mg (50 mg Oral Given 05/18/16 0733)  polyethylene glycol (MIRALAX / GLYCOLAX) packet 17 g (17 g Oral Not Given 05/17/16 1910)  docusate sodium (COLACE) capsule 100 mg (100 mg Oral Not Given 05/17/16  2200)  ondansetron (ZOFRAN) tablet 4 mg ( Oral See Alternative 05/18/16 0742)    Or  ondansetron (ZOFRAN) injection 4 mg (4 mg Intravenous Given 05/18/16 0742)  pantoprazole (PROTONIX) EC tablet 40 mg (40 mg Oral Not Given 05/17/16 1911)    Or  pantoprazole (PROTONIX) injection 40 mg ( Intravenous See Alternative 05/17/16 1911)  morphine 4 MG/ML injection 2 mg (2 mg Intravenous Given 05/18/16 0101)  ceFAZolin (ANCEF) IVPB 1 g/50 mL premix (1 g Intravenous Given 05/18/16 0458)  bacitracin ointment ( Topical Given 05/17/16 2200)  Tdap (BOOSTRIX) injection 0.5 mL (0.5 mLs Intramuscular Given 05/17/16 1033)  0.9 %  sodium chloride infusion ( Intravenous Stopped 05/17/16 1105)  ondansetron (ZOFRAN) injection 4 mg (4 mg Intravenous Given 05/17/16 1029)  fentaNYL (SUBLIMAZE) injection 50 mcg (50 mcg Intravenous Given 05/17/16 1122)  lidocaine-EPINEPHrine (XYLOCAINE W/EPI) 1 %-1:100000 (with pres) injection 10 mL (10 mLs Intradermal Given 05/17/16 1800)     Initial Impression / Assessment and Plan / ED Course  I have reviewed the triage vital signs and the nursing notes.  Pertinent labs & imaging results that were available during my care of the patient were reviewed by me and considered in my medical decision making (see chart for details).  Clinical Course     61 year old female who presents after facial trauma. Trauma surgery present on evaluation, but given hemodynamically stable downgraded trauma to level II.  On exam, primarily with injuries to the face. CT images visualized. Has a deep laceration over the nose with underlying nasal bone fracture. Left maxillary incisor impaction with fracture. Also with left sided subdural hematoma.  Discussed with Dr. Mikal Plane from neurosurgery. Hematoma is nonsurgical, but did recommend observation in the hospital given that she is amnestic to the event and still symptomatic from her head trauma with some disorientation and confused answers.  Will consult facial  trauma regarding nasal bone fracture with overlying laceration. Spoke with Dr. Kenney Houseman, who will see patient in hospital.  Discussed with Dr. Sheliah Hatch  from trauma surgery who will admit for ongoing management. Final Clinical Impressions(s) / ED Diagnoses   Final diagnoses:  Facial injury, initial encounter  Traumatic subdural hematoma with loss of consciousness of 30 minutes or less, initial encounter [facility])  Open fracture of nasal bone, initial encounter    New Prescriptions Current Discharge Medication List       Lavera Guise, MD 05/18/16 [phone]

## 2016-05-17 NOTE — H&P (Signed)
Annette Kidd is an 61 y.o. female.   Chief Complaint: Blunt head trauma HPI: Annette Kidd was walking her dog when a tree limb came down and kicked back up striking her in the face. She was knocked off her feet and lost consciousness. She was brought in by EMS with a GCS of 12 and borderline hypotension as a level 1 trauma activation. On arrival her GCS was 14 and her SBP was 100 so she was downgraded to a level 2. EDP completed her workup which showed a nasal fracture and a small SDH. Neurosugery and ENT were consulted and trauma was asked to admit.  History reviewed. No pertinent past medical history.  History reviewed. No pertinent surgical history.  No family history on file. Social History:  has no tobacco, alcohol, and drug history on file.  Allergies: No Known Allergies   Results for orders placed or performed during the hospital encounter of 05/17/16 (from the past 48 hour(s))  Prepare fresh frozen plasma     Status: None   Collection Time: 05/17/16  9:55 AM  Result Value Ref Range   ISSUE DATE / TIME [id number]    Blood Product Unit Number [id number]    Unit Type and Rh 6200    Blood Product Expiration Date [id number]    ISSUE DATE / TIME [id number]    Blood Product Unit Number [id number]    Unit Type and Rh 6200    Blood Product Expiration Date 449201007121   CBC with Differential     Status: None   Collection Time: 05/17/16 10:00 AM  Result Value Ref Range   WBC 6.6 4.0 - 10.5 K/uL   RBC 4.18 3.87 - 5.11 MIL/uL   Hemoglobin 13.3 12.0 - 15.0 g/dL   HCT 38.7 36.0 - 46.0 %   MCV 92.6 78.0 - 100.0 fL   MCH 31.8 26.0 - 34.0 pg   MCHC 34.4 30.0 - 36.0 g/dL   RDW 13.1 11.5 - 15.5 %   Platelets 278 150 - 400 K/uL   Neutrophils Relative % 47 %   Neutro Abs 3.1 1.7 - 7.7 K/uL   Lymphocytes Relative 42 %   Lymphs Abs 2.8 0.7 - 4.0 K/uL   Monocytes Relative 10 %   Monocytes Absolute 0.7 0.1 - 1.0 K/uL   Eosinophils Relative 1 %   Eosinophils Absolute 0.1 0.0 - 0.7 K/uL    Basophils Relative 0 %   Basophils Absolute 0.0 0.0 - 0.1 K/uL  Comprehensive metabolic panel     Status: Abnormal   Collection Time: 05/17/16 10:00 AM  Result Value Ref Range   Sodium 133 (L) 135 - 145 mmol/L   Potassium 3.7 3.5 - 5.1 mmol/L   Chloride 100 (L) 101 - 111 mmol/L   CO2 21 (L) 22 - 32 mmol/L   Glucose, Bld 144 (H) 65 - 99 mg/dL   BUN 9 6 - 20 mg/dL   Creatinine, Ser 0.62 0.44 - 1.00 mg/dL   Calcium 9.5 8.9 - 10.3 mg/dL   Total Protein 6.6 6.5 - 8.1 g/dL   Albumin 4.1 3.5 - 5.0 g/dL   AST 28 15 - 41 U/L   ALT 20 14 - 54 U/L   Alkaline Phosphatase 64 38 - 126 U/L   Total Bilirubin 0.7 0.3 - 1.2 mg/dL   GFR calc non Af Amer >60 >60 mL/min   GFR calc Af Amer >60 >60 mL/min    Comment: (NOTE) The eGFR has been calculated using the CKD EPI equation.  This calculation has not been validated in all clinical situations. eGFR's persistently <60 mL/min signify possible Chronic Kidney Disease.    Anion gap 12 5 - 15  CDS serology     Status: None   Collection Time: 05/17/16 10:00 AM  Result Value Ref Range   CDS serology specimen      SPECIMEN WILL BE HELD FOR 14 DAYS IF TESTING IS REQUIRED   Ct Head Wo Contrast  Result Date: 05/17/2016 CLINICAL DATA:  Annette Kidd tree branch hit face. EXAM: CT HEAD WITHOUT CONTRAST CT MAXILLOFACIAL WITHOUT CONTRAST CT CERVICAL SPINE WITHOUT CONTRAST TECHNIQUE: Multidetector CT imaging of the head, cervical spine, and maxillofacial structures were performed using the standard protocol without intravenous contrast. Multiplanar CT image reconstructions of the cervical spine and maxillofacial structures were also generated. COMPARISON:  None. FINDINGS: CT HEAD FINDINGS Brain: Left tentorial subdural hematoma is noted. No mass effect or midline shift is noted. Ventricular size is within normal limits. There is no evidence of mass lesion or acute infarction. Vascular: No hyperdense vessel or unexpected calcification. Skull: Normal. Negative for fracture or  focal lesion. Other: Small left occipital scalp hematoma is noted. CT MAXILLOFACIAL FINDINGS Osseous: Comminuted and mildly displaced fracture is seen involving the nasal bones. Nondisplaced fracture involving left lateral incisor of maxilla is noted. Orbits: Negative. No traumatic or inflammatory finding. Sinuses: Clear. Soft tissues: Negative. CT CERVICAL SPINE FINDINGS Alignment: Normal. Skull base and vertebrae: No acute fracture. No primary bone lesion or focal pathologic process. Soft tissues and spinal canal: No prevertebral fluid or swelling. No visible canal hematoma. Disc levels: Mild degenerative disc disease is noted at C5-6 with posterior osteophyte formation. Moderate degenerative disc disease is noted at C6-7. Upper chest: Negative. Other: None. IMPRESSION: Acute left tentorial subdural hematoma is noted. Comminuted and mildly displaced nasal bone fracture. Also noted is nondisplaced fracture involving the left lateral maxillary incisor. Degenerative changes are noted in the cervical spine. No acute abnormality seen. Critical Value/emergent results were called by telephone at the time of interpretation on 05/17/2016 at 11:43 am to Dr. Brantley Stage , who verbally acknowledged these results. Electronically Signed   By: Marijo Conception, M.D.   On: 05/17/2016 11:46   Ct Cervical Spine Wo Contrast  Result Date: 05/17/2016 CLINICAL DATA:  Annette Kidd tree branch hit face. EXAM: CT HEAD WITHOUT CONTRAST CT MAXILLOFACIAL WITHOUT CONTRAST CT CERVICAL SPINE WITHOUT CONTRAST TECHNIQUE: Multidetector CT imaging of the head, cervical spine, and maxillofacial structures were performed using the standard protocol without intravenous contrast. Multiplanar CT image reconstructions of the cervical spine and maxillofacial structures were also generated. COMPARISON:  None. FINDINGS: CT HEAD FINDINGS Brain: Left tentorial subdural hematoma is noted. No mass effect or midline shift is noted. Ventricular size is within normal  limits. There is no evidence of mass lesion or acute infarction. Vascular: No hyperdense vessel or unexpected calcification. Skull: Normal. Negative for fracture or focal lesion. Other: Small left occipital scalp hematoma is noted. CT MAXILLOFACIAL FINDINGS Osseous: Comminuted and mildly displaced fracture is seen involving the nasal bones. Nondisplaced fracture involving left lateral incisor of maxilla is noted. Orbits: Negative. No traumatic or inflammatory finding. Sinuses: Clear. Soft tissues: Negative. CT CERVICAL SPINE FINDINGS Alignment: Normal. Skull base and vertebrae: No acute fracture. No primary bone lesion or focal pathologic process. Soft tissues and spinal canal: No prevertebral fluid or swelling. No visible canal hematoma. Disc levels: Mild degenerative disc disease is noted at C5-6 with posterior osteophyte formation. Moderate degenerative disc disease is noted  at C6-7. Upper chest: Negative. Other: None. IMPRESSION: Acute left tentorial subdural hematoma is noted. Comminuted and mildly displaced nasal bone fracture. Also noted is nondisplaced fracture involving the left lateral maxillary incisor. Degenerative changes are noted in the cervical spine. No acute abnormality seen. Critical Value/emergent results were called by telephone at the time of interpretation on 05/17/2016 at 11:43 am to Dr. Brantley Stage , who verbally acknowledged these results. Electronically Signed   By: Marijo Conception, M.D.   On: 05/17/2016 11:46   Ct Maxillofacial Wo Contrast  Result Date: 05/17/2016 CLINICAL DATA:  Annette Kidd tree branch hit face. EXAM: CT HEAD WITHOUT CONTRAST CT MAXILLOFACIAL WITHOUT CONTRAST CT CERVICAL SPINE WITHOUT CONTRAST TECHNIQUE: Multidetector CT imaging of the head, cervical spine, and maxillofacial structures were performed using the standard protocol without intravenous contrast. Multiplanar CT image reconstructions of the cervical spine and maxillofacial structures were also generated. COMPARISON:   None. FINDINGS: CT HEAD FINDINGS Brain: Left tentorial subdural hematoma is noted. No mass effect or midline shift is noted. Ventricular size is within normal limits. There is no evidence of mass lesion or acute infarction. Vascular: No hyperdense vessel or unexpected calcification. Skull: Normal. Negative for fracture or focal lesion. Other: Small left occipital scalp hematoma is noted. CT MAXILLOFACIAL FINDINGS Osseous: Comminuted and mildly displaced fracture is seen involving the nasal bones. Nondisplaced fracture involving left lateral incisor of maxilla is noted. Orbits: Negative. No traumatic or inflammatory finding. Sinuses: Clear. Soft tissues: Negative. CT CERVICAL SPINE FINDINGS Alignment: Normal. Skull base and vertebrae: No acute fracture. No primary bone lesion or focal pathologic process. Soft tissues and spinal canal: No prevertebral fluid or swelling. No visible canal hematoma. Disc levels: Mild degenerative disc disease is noted at C5-6 with posterior osteophyte formation. Moderate degenerative disc disease is noted at C6-7. Upper chest: Negative. Other: None. IMPRESSION: Acute left tentorial subdural hematoma is noted. Comminuted and mildly displaced nasal bone fracture. Also noted is nondisplaced fracture involving the left lateral maxillary incisor. Degenerative changes are noted in the cervical spine. No acute abnormality seen. Critical Value/emergent results were called by telephone at the time of interpretation on 05/17/2016 at 11:43 am to Dr. Brantley Stage , who verbally acknowledged these results. Electronically Signed   By: Marijo Conception, M.D.   On: 05/17/2016 11:46    Review of Systems  Constitutional: Negative for weight loss.  HENT: Negative for ear discharge, ear pain, hearing loss and tinnitus.   Eyes: Negative for blurred vision, double vision, photophobia and pain.  Respiratory: Negative for cough, sputum production and shortness of breath.   Cardiovascular: Negative for chest  pain.  Gastrointestinal: Positive for nausea. Negative for abdominal pain and vomiting.  Genitourinary: Negative for dysuria, flank pain, frequency and urgency.  Musculoskeletal: Negative for back pain, falls, joint pain, myalgias and neck pain.  Neurological: Positive for loss of consciousness. Negative for dizziness, tingling, sensory change, focal weakness and headaches.  Endo/Heme/Allergies: Does not bruise/bleed easily.  Psychiatric/Behavioral: Positive for memory loss. Negative for depression and substance abuse. The patient is not nervous/anxious.     Blood pressure 131/77, pulse 84, temperature 97.5 F (36.4 C), temperature source Axillary, resp. rate 20, SpO2 96 %. Physical Exam  Vitals reviewed. Constitutional: She is oriented to person, place, and time. She appears well-developed and well-nourished. She is cooperative. No distress. Cervical collar and nasal cannula in place.  HENT:  Head: Normocephalic. Head is without raccoon's eyes, without Battle's sign, without abrasion, without contusion and without laceration.  Right  Ear: Hearing, tympanic membrane, external ear and ear canal normal. No lacerations. No drainage or tenderness. No foreign bodies. Tympanic membrane is not perforated. No hemotympanum.  Left Ear: Hearing, tympanic membrane, external ear and ear canal normal. No lacerations. No drainage or tenderness. No foreign bodies. Tympanic membrane is not perforated. No hemotympanum.  Nose: Nose lacerations and nasal deformity present. No sinus tenderness or nasal septal hematoma. Epistaxis is observed.  Mouth/Throat: Uvula is midline, oropharynx is clear and moist and mucous membranes are normal. No lacerations. No oropharyngeal exudate.  Eyes: Conjunctivae, EOM and lids are normal. Pupils are equal, round, and reactive to light. No scleral icterus.  Neck: Trachea normal. No JVD present. Spinous process tenderness present. No muscular tenderness present. Carotid bruit is not  present. No thyromegaly present.  Cardiovascular: Normal rate, regular rhythm, normal heart sounds, intact distal pulses and normal pulses.  Exam reveals no gallop and no friction rub.   No murmur heard. Respiratory: Effort normal and breath sounds normal. No respiratory distress. She has no wheezes. She has no rales. She exhibits no tenderness, no bony tenderness, no laceration and no crepitus.  GI: Soft. Normal appearance and bowel sounds are normal. She exhibits no distension. There is no tenderness. There is no rigidity, no rebound, no guarding and no CVA tenderness.  Genitourinary: Vagina normal.  Musculoskeletal: Normal range of motion. She exhibits no edema, tenderness or deformity.  Lymphadenopathy:    She has no cervical adenopathy.  Neurological: She is alert and oriented to person, place, and time. She has normal strength. No cranial nerve deficit or sensory deficit. GCS eye subscore is 4. GCS verbal subscore is 4. GCS motor subscore is 6.  Skin: Skin is warm, dry and intact. She is not diaphoretic.  Psychiatric: She has a normal mood and affect. Her speech is normal and behavior is normal.     Assessment/Plan Blunt head trauma TBI w/SDH -- Dr. Christella Noa consulted, clinical monitoring, no need for repeat head CT unless mental status deteriorates Nasal fx w/lac -- ENT to consult  Admit to trauma to 40M    Lisette Abu, PA-C Pager: [phone] General Trauma PA Pager: [phone] 05/17/2016, 1:42 PM

## 2016-05-17 NOTE — Consult Note (Signed)
Reason for Consult:Subdural hematoma Referring Physician: Tenlee Wollin is an 61 y.o. female.  HPI: whom this morning was struck by a falling tree limb on the head. She is amnestic for the event, and may have lost consciousness. Since being in the ED she has repeated the same question multiple times. Was brought by EMS to the [facility]. She was alert on arrival, had an obvious nasal fracture, and was found to have a dental injury.   History reviewed. No pertinent past medical history.  History reviewed. No pertinent surgical history.  No family history on file.  Social History:  has no tobacco, alcohol, and drug history on file.  Allergies: No Known Allergies  Medications: I have reviewed the patient's current medications.  Results for orders placed or performed during the hospital encounter of 05/17/16 (from the past 48 hour(s))  Prepare fresh frozen plasma     Status: None   Collection Time: 05/17/16  9:55 AM  Result Value Ref Range   ISSUE DATE / TIME [id number]    Blood Product Unit Number [id number]    Unit Type and Rh 6200    Blood Product Expiration Date [id number]    ISSUE DATE / TIME [id number]    Blood Product Unit Number [id number]    Unit Type and Rh 6200    Blood Product Expiration Date [id number]   CBC with Differential     Status: None   Collection Time: 05/17/16 10:00 AM  Result Value Ref Range   WBC 6.6 4.0 - 10.5 K/uL   RBC 4.18 3.87 - 5.11 MIL/uL   Hemoglobin 13.3 12.0 - 15.0 g/dL   HCT 38.7 36.0 - 46.0 %   MCV 92.6 78.0 - 100.0 fL   MCH 31.8 26.0 - 34.0 pg   MCHC 34.4 30.0 - 36.0 g/dL   RDW 13.1 11.5 - 15.5 %   Platelets 278 150 - 400 K/uL   Neutrophils Relative % 47 %   Neutro Abs 3.1 1.7 - 7.7 K/uL   Lymphocytes Relative 42 %   Lymphs Abs 2.8 0.7 - 4.0 K/uL   Monocytes Relative 10 %   Monocytes Absolute 0.7 0.1 - 1.0 K/uL   Eosinophils Relative 1 %   Eosinophils Absolute 0.1 0.0 - 0.7 K/uL   Basophils Relative 0 %   Basophils  Absolute 0.0 0.0 - 0.1 K/uL  Comprehensive metabolic panel     Status: Abnormal   Collection Time: 05/17/16 10:00 AM  Result Value Ref Range   Sodium 133 (L) 135 - 145 mmol/L   Potassium 3.7 3.5 - 5.1 mmol/L   Chloride 100 (L) 101 - 111 mmol/L   CO2 21 (L) 22 - 32 mmol/L   Glucose, Bld 144 (H) 65 - 99 mg/dL   BUN 9 6 - 20 mg/dL   Creatinine, Ser 0.62 0.44 - 1.00 mg/dL   Calcium 9.5 8.9 - 10.3 mg/dL   Total Protein 6.6 6.5 - 8.1 g/dL   Albumin 4.1 3.5 - 5.0 g/dL   AST 28 15 - 41 U/L   ALT 20 14 - 54 U/L   Alkaline Phosphatase 64 38 - 126 U/L   Total Bilirubin 0.7 0.3 - 1.2 mg/dL   GFR calc non Af Amer >60 >60 mL/min   GFR calc Af Amer >60 >60 mL/min    Comment: (NOTE) The eGFR has been calculated using the CKD EPI equation. This calculation has not been validated in all clinical situations. eGFR's persistently <60 mL/min signify possible Chronic  Kidney Disease.    Anion gap 12 5 - 15  CDS serology     Status: None   Collection Time: 05/17/16 10:00 AM  Result Value Ref Range   CDS serology specimen      SPECIMEN WILL BE HELD FOR 14 DAYS IF TESTING IS REQUIRED    Ct Head Wo Contrast  Result Date: 05/17/2016 CLINICAL DATA:  Annette Kidd tree branch hit face. EXAM: CT HEAD WITHOUT CONTRAST CT MAXILLOFACIAL WITHOUT CONTRAST CT CERVICAL SPINE WITHOUT CONTRAST TECHNIQUE: Multidetector CT imaging of the head, cervical spine, and maxillofacial structures were performed using the standard protocol without intravenous contrast. Multiplanar CT image reconstructions of the cervical spine and maxillofacial structures were also generated. COMPARISON:  None. FINDINGS: CT HEAD FINDINGS Brain: Left tentorial subdural hematoma is noted. No mass effect or midline shift is noted. Ventricular size is within normal limits. There is no evidence of mass lesion or acute infarction. Vascular: No hyperdense vessel or unexpected calcification. Skull: Normal. Negative for fracture or focal lesion. Other: Small left  occipital scalp hematoma is noted. CT MAXILLOFACIAL FINDINGS Osseous: Comminuted and mildly displaced fracture is seen involving the nasal bones. Nondisplaced fracture involving left lateral incisor of maxilla is noted. Orbits: Negative. No traumatic or inflammatory finding. Sinuses: Clear. Soft tissues: Negative. CT CERVICAL SPINE FINDINGS Alignment: Normal. Skull base and vertebrae: No acute fracture. No primary bone lesion or focal pathologic process. Soft tissues and spinal canal: No prevertebral fluid or swelling. No visible canal hematoma. Disc levels: Mild degenerative disc disease is noted at C5-6 with posterior osteophyte formation. Moderate degenerative disc disease is noted at C6-7. Upper chest: Negative. Other: None. IMPRESSION: Acute left tentorial subdural hematoma is noted. Comminuted and mildly displaced nasal bone fracture. Also noted is nondisplaced fracture involving the left lateral maxillary incisor. Degenerative changes are noted in the cervical spine. No acute abnormality seen. Critical Value/emergent results were called by telephone at the time of interpretation on 05/17/2016 at 11:43 am to Dr. Brantley Stage , who verbally acknowledged these results. Electronically Signed   By: Marijo Conception, M.D.   On: 05/17/2016 11:46   Ct Cervical Spine Wo Contrast  Result Date: 05/17/2016 CLINICAL DATA:  Annette Kidd tree branch hit face. EXAM: CT HEAD WITHOUT CONTRAST CT MAXILLOFACIAL WITHOUT CONTRAST CT CERVICAL SPINE WITHOUT CONTRAST TECHNIQUE: Multidetector CT imaging of the head, cervical spine, and maxillofacial structures were performed using the standard protocol without intravenous contrast. Multiplanar CT image reconstructions of the cervical spine and maxillofacial structures were also generated. COMPARISON:  None. FINDINGS: CT HEAD FINDINGS Brain: Left tentorial subdural hematoma is noted. No mass effect or midline shift is noted. Ventricular size is within normal limits. There is no evidence of  mass lesion or acute infarction. Vascular: No hyperdense vessel or unexpected calcification. Skull: Normal. Negative for fracture or focal lesion. Other: Small left occipital scalp hematoma is noted. CT MAXILLOFACIAL FINDINGS Osseous: Comminuted and mildly displaced fracture is seen involving the nasal bones. Nondisplaced fracture involving left lateral incisor of maxilla is noted. Orbits: Negative. No traumatic or inflammatory finding. Sinuses: Clear. Soft tissues: Negative. CT CERVICAL SPINE FINDINGS Alignment: Normal. Skull base and vertebrae: No acute fracture. No primary bone lesion or focal pathologic process. Soft tissues and spinal canal: No prevertebral fluid or swelling. No visible canal hematoma. Disc levels: Mild degenerative disc disease is noted at C5-6 with posterior osteophyte formation. Moderate degenerative disc disease is noted at C6-7. Upper chest: Negative. Other: None. IMPRESSION: Acute left tentorial subdural hematoma is noted. Comminuted  and mildly displaced nasal bone fracture. Also noted is nondisplaced fracture involving the left lateral maxillary incisor. Degenerative changes are noted in the cervical spine. No acute abnormality seen. Critical Value/emergent results were called by telephone at the time of interpretation on 05/17/2016 at 11:43 am to Dr. Brantley Stage , who verbally acknowledged these results. Electronically Signed   By: Marijo Conception, M.D.   On: 05/17/2016 11:46   Ct Maxillofacial Wo Contrast  Result Date: 05/17/2016 CLINICAL DATA:  Annette Kidd tree branch hit face. EXAM: CT HEAD WITHOUT CONTRAST CT MAXILLOFACIAL WITHOUT CONTRAST CT CERVICAL SPINE WITHOUT CONTRAST TECHNIQUE: Multidetector CT imaging of the head, cervical spine, and maxillofacial structures were performed using the standard protocol without intravenous contrast. Multiplanar CT image reconstructions of the cervical spine and maxillofacial structures were also generated. COMPARISON:  None. FINDINGS: CT HEAD  FINDINGS Brain: Left tentorial subdural hematoma is noted. No mass effect or midline shift is noted. Ventricular size is within normal limits. There is no evidence of mass lesion or acute infarction. Vascular: No hyperdense vessel or unexpected calcification. Skull: Normal. Negative for fracture or focal lesion. Other: Small left occipital scalp hematoma is noted. CT MAXILLOFACIAL FINDINGS Osseous: Comminuted and mildly displaced fracture is seen involving the nasal bones. Nondisplaced fracture involving left lateral incisor of maxilla is noted. Orbits: Negative. No traumatic or inflammatory finding. Sinuses: Clear. Soft tissues: Negative. CT CERVICAL SPINE FINDINGS Alignment: Normal. Skull base and vertebrae: No acute fracture. No primary bone lesion or focal pathologic process. Soft tissues and spinal canal: No prevertebral fluid or swelling. No visible canal hematoma. Disc levels: Mild degenerative disc disease is noted at C5-6 with posterior osteophyte formation. Moderate degenerative disc disease is noted at C6-7. Upper chest: Negative. Other: None. IMPRESSION: Acute left tentorial subdural hematoma is noted. Comminuted and mildly displaced nasal bone fracture. Also noted is nondisplaced fracture involving the left lateral maxillary incisor. Degenerative changes are noted in the cervical spine. No acute abnormality seen. Critical Value/emergent results were called by telephone at the time of interpretation on 05/17/2016 at 11:43 am to Dr. Brantley Stage , who verbally acknowledged these results. Electronically Signed   By: Marijo Conception, M.D.   On: 05/17/2016 11:46    Review of Systems  Constitutional: Negative.   HENT: Positive for nosebleeds.   Eyes: Negative.   Respiratory: Negative.   Cardiovascular: Negative.   Gastrointestinal: Negative.   Genitourinary: Negative.   Musculoskeletal: Negative.   Skin: Negative.   Neurological: Positive for loss of consciousness.  Psychiatric/Behavioral: Positive  for memory loss.   Blood pressure 125/69, pulse 83, temperature 97.5 F (36.4 C), temperature source Axillary, resp. rate 14, SpO2 99 %. Physical Exam  Constitutional: She appears well-developed and well-nourished. She appears distressed.  HENT:  Significant nasal bleeding, blood in oropharynx Nose is tender   Eyes: Conjunctivae and EOM are normal. Pupils are equal, round, and reactive to light.  Neck:  In cervical collar, motion is restricted  Cardiovascular: Normal rate, regular rhythm and normal heart sounds.   Respiratory: Effort normal and breath sounds normal.  GI: Soft.  Neurological: She is alert. She has normal strength and normal reflexes. She is disoriented. No cranial nerve deficit or sensory deficit. GCS eye subscore is 4. GCS verbal subscore is 5. GCS motor subscore is 6. She displays no Babinski's sign on the right side. She displays no Babinski's sign on the left side.  Not oriented to time, nor situation Amnestic for the event Gait not assessed, coordination is  normal in the upper extremities, and lower extremities when assessed on the stretcher     Assessment/Plan: Admit secondary to continued mental status changes. Would expect improvement, but currently she is not oriented x 4. No real motor concerns. Will follow. If OR is necessary she is ok to proceed. Will follow, repeat ct is not necessary unless exam changes.   Laresa Oshiro L 05/17/2016, 12:53 PM

## 2016-05-18 LAB — BASIC METABOLIC PANEL
Anion gap: 6 (ref 5–15)
Anion gap: 9 (ref 5–15)
BUN: 8 mg/dL (ref 6–20)
BUN: 6 mg/dL (ref 6–20)
CALCIUM: 8.5 mg/dL — AB (ref 8.9–10.3)
CO2: 23 mmol/L (ref 22–32)
CO2: 20 mmol/L — AB (ref 22–32)
Calcium: 8.5 mg/dL — ABNORMAL LOW (ref 8.9–10.3)
Chloride: 96 mmol/L — ABNORMAL LOW (ref 101–111)
Chloride: 92 mmol/L — ABNORMAL LOW (ref 101–111)
Creatinine, Ser: 0.45 mg/dL (ref 0.44–1.00)
Creatinine, Ser: 0.41 mg/dL — ABNORMAL LOW (ref 0.44–1.00)
GFR calc Af Amer: >60 mL/min (ref >60)
GFR calc non Af Amer: >60 mL/min (ref >60)
GFR calc non Af Amer: >60 mL/min (ref >60)
GLUCOSE: 118 mg/dL — AB (ref 65–99)
GLUCOSE: 135 mg/dL — AB (ref 65–99)
POTASSIUM: 3.9 mmol/L (ref 3.5–5.1)
Potassium: 3.8 mmol/L (ref 3.5–5.1)
Sodium: 125 mmol/L — ABNORMAL LOW (ref 135–145)
Sodium: 121 mmol/L — ABNORMAL LOW (ref 135–145)

## 2016-05-18 LAB — CBC
HEMATOCRIT: 33.2 % — AB (ref 36.0–46.0)
Hemoglobin: 11.6 g/dL — ABNORMAL LOW (ref 12.0–15.0)
MCH: 31.5 pg (ref 26.0–34.0)
MCHC: 34.9 g/dL (ref 30.0–36.0)
MCV: 90.2 fL (ref 78.0–100.0)
Platelets: 219 10*3/uL (ref 150–400)
RBC: 3.68 MIL/uL — ABNORMAL LOW (ref 3.87–5.11)
RDW: 12.5 % (ref 11.5–15.5)
WBC: 12.6 10*3/uL — ABNORMAL HIGH (ref 4.0–10.5)

## 2016-05-18 NOTE — Care Management Note (Signed)
Case Management Note  Patient Details  Name: Annette Kidd MRN: [id number] Date of Birth: 1956-02-25  Subjective/Objective:   Pt admitted on 05/17/16 after being hit by a falling tree branch.  She sustained SDH with concussive syndrome, nasal bone fx, and maxilla fx.  PTA, pt independent, lives with spouse.                   Action/Plan: PT recommending HH follow up; OT consult pending.  Will follow for continued home needs as pt progresses.    Expected Discharge Date:                  Expected Discharge Plan:  Home w Home Health Services  In-House Referral:     Discharge planning Services  CM Consult  Post Acute Care Choice:    Choice offered to:     DME Arranged:    DME Agency:     HH Arranged:    HH Agency:     Status of Service:  In process, will continue to follow  If discussed at Long Length of Stay Meetings, dates discussed:    Additional Comments:  Quintella BatonJulie W. Gavyn Zoss, RN, BSN  Trauma/Neuro ICU Case Manager [phone]

## 2016-05-18 NOTE — Progress Notes (Signed)
Subjective: Some HA and back pain, nausea earlier  Objective: Vital signs in last 24 hours: Temp:  [97.4 F (36.3 C)-99.6 F (37.6 C)] 98.4 F (36.9 C) (01/11 0800) Pulse Rate:  [73-99] 79 (01/11 0900) Resp:  [11-23] 12 (01/11 0900) BP: (110-145)/(61-85) 132/80 (01/11 0900) SpO2:  [95 %-100 %] 97 % (01/11 0900) Weight:  [62.8 kg (138 lb 7.2 oz)] 62.8 kg (138 lb 7.2 oz) (01/10 1715)    Intake/Output from previous day: 01/10 0701 - 01/11 0700 In: 1975 [P.O.:400; I.V.:1475; IV Piggyback:100] Out: 0  Intake/Output this shift: Total I/O In: 150 [I.V.:150] Out: -   General appearance: alert and cooperative Head: nasal and facial lacs intact with edema, R front incisor impacted Neck: no posterior midline tenderness, no pain on AROM Resp: clear to auscultation bilaterally Cardio: regular rate and rhythm GI: soft, NT, ND Neuro: oriented, F/C, MAE  Lab Results: CBC   Recent Labs  05/17/16 1000 05/18/16 0530  WBC 6.6 12.6*  HGB 13.3 11.6*  HCT 38.7 33.2*  PLT 278 219   BMET  Recent Labs  05/17/16 1000 05/18/16 0530  NA 133* 125*  K 3.7 3.8  CL 100* 96*  CO2 21* 23  GLUCOSE 144* 118*  BUN 9 8  CREATININE 0.62 0.45  CALCIUM 9.5 8.5*   PT/INR No results for input(s): LABPROT, INR in the last 72 hours. ABG No results for input(s): PHART, HCO3 in the last 72 hours.  Invalid input(s): PCO2, PO2  Studies/Results: Ct Head Wo Contrast  Result Date: 05/17/2016 CLINICAL DATA:  De HollingsheadFallen tree branch hit face. EXAM: CT HEAD WITHOUT CONTRAST CT MAXILLOFACIAL WITHOUT CONTRAST CT CERVICAL SPINE WITHOUT CONTRAST TECHNIQUE: Multidetector CT imaging of the head, cervical spine, and maxillofacial structures were performed using the standard protocol without intravenous contrast. Multiplanar CT image reconstructions of the cervical spine and maxillofacial structures were also generated. COMPARISON:  None. FINDINGS: CT HEAD FINDINGS Brain: Left tentorial subdural hematoma is  noted. No mass effect or midline shift is noted. Ventricular size is within normal limits. There is no evidence of mass lesion or acute infarction. Vascular: No hyperdense vessel or unexpected calcification. Skull: Normal. Negative for fracture or focal lesion. Other: Small left occipital scalp hematoma is noted. CT MAXILLOFACIAL FINDINGS Osseous: Comminuted and mildly displaced fracture is seen involving the nasal bones. Nondisplaced fracture involving left lateral incisor of maxilla is noted. Orbits: Negative. No traumatic or inflammatory finding. Sinuses: Clear. Soft tissues: Negative. CT CERVICAL SPINE FINDINGS Alignment: Normal. Skull base and vertebrae: No acute fracture. No primary bone lesion or focal pathologic process. Soft tissues and spinal canal: No prevertebral fluid or swelling. No visible canal hematoma. Disc levels: Mild degenerative disc disease is noted at C5-6 with posterior osteophyte formation. Moderate degenerative disc disease is noted at C6-7. Upper chest: Negative. Other: None. IMPRESSION: Acute left tentorial subdural hematoma is noted. Comminuted and mildly displaced nasal bone fracture. Also noted is nondisplaced fracture involving the left lateral maxillary incisor. Degenerative changes are noted in the cervical spine. No acute abnormality seen. Critical Value/emergent results were called by telephone at the time of interpretation on 05/17/2016 at 11:43 am to Dr. Crista CurbANA LIU , who verbally acknowledged these results. Electronically Signed   By: Lupita RaiderJames  Green Jr, M.D.   On: 05/17/2016 11:46   Ct Cervical Spine Wo Contrast  Result Date: 05/17/2016 CLINICAL DATA:  De HollingsheadFallen tree branch hit face. EXAM: CT HEAD WITHOUT CONTRAST CT MAXILLOFACIAL WITHOUT CONTRAST CT CERVICAL SPINE WITHOUT CONTRAST TECHNIQUE: Multidetector CT imaging of  the head, cervical spine, and maxillofacial structures were performed using the standard protocol without intravenous contrast. Multiplanar CT image reconstructions  of the cervical spine and maxillofacial structures were also generated. COMPARISON:  None. FINDINGS: CT HEAD FINDINGS Brain: Left tentorial subdural hematoma is noted. No mass effect or midline shift is noted. Ventricular size is within normal limits. There is no evidence of mass lesion or acute infarction. Vascular: No hyperdense vessel or unexpected calcification. Skull: Normal. Negative for fracture or focal lesion. Other: Small left occipital scalp hematoma is noted. CT MAXILLOFACIAL FINDINGS Osseous: Comminuted and mildly displaced fracture is seen involving the nasal bones. Nondisplaced fracture involving left lateral incisor of maxilla is noted. Orbits: Negative. No traumatic or inflammatory finding. Sinuses: Clear. Soft tissues: Negative. CT CERVICAL SPINE FINDINGS Alignment: Normal. Skull base and vertebrae: No acute fracture. No primary bone lesion or focal pathologic process. Soft tissues and spinal canal: No prevertebral fluid or swelling. No visible canal hematoma. Disc levels: Mild degenerative disc disease is noted at C5-6 with posterior osteophyte formation. Moderate degenerative disc disease is noted at C6-7. Upper chest: Negative. Other: None. IMPRESSION: Acute left tentorial subdural hematoma is noted. Comminuted and mildly displaced nasal bone fracture. Also noted is nondisplaced fracture involving the left lateral maxillary incisor. Degenerative changes are noted in the cervical spine. No acute abnormality seen. Critical Value/emergent results were called by telephone at the time of interpretation on 05/17/2016 at 11:43 am to Dr. Crista Curb , who verbally acknowledged these results. Electronically Signed   By: Lupita Raider, M.D.   On: 05/17/2016 11:46   Ct Maxillofacial Wo Contrast  Result Date: 05/17/2016 CLINICAL DATA:  De Hollingshead tree branch hit face. EXAM: CT HEAD WITHOUT CONTRAST CT MAXILLOFACIAL WITHOUT CONTRAST CT CERVICAL SPINE WITHOUT CONTRAST TECHNIQUE: Multidetector CT imaging of the  head, cervical spine, and maxillofacial structures were performed using the standard protocol without intravenous contrast. Multiplanar CT image reconstructions of the cervical spine and maxillofacial structures were also generated. COMPARISON:  None. FINDINGS: CT HEAD FINDINGS Brain: Left tentorial subdural hematoma is noted. No mass effect or midline shift is noted. Ventricular size is within normal limits. There is no evidence of mass lesion or acute infarction. Vascular: No hyperdense vessel or unexpected calcification. Skull: Normal. Negative for fracture or focal lesion. Other: Small left occipital scalp hematoma is noted. CT MAXILLOFACIAL FINDINGS Osseous: Comminuted and mildly displaced fracture is seen involving the nasal bones. Nondisplaced fracture involving left lateral incisor of maxilla is noted. Orbits: Negative. No traumatic or inflammatory finding. Sinuses: Clear. Soft tissues: Negative. CT CERVICAL SPINE FINDINGS Alignment: Normal. Skull base and vertebrae: No acute fracture. No primary bone lesion or focal pathologic process. Soft tissues and spinal canal: No prevertebral fluid or swelling. No visible canal hematoma. Disc levels: Mild degenerative disc disease is noted at C5-6 with posterior osteophyte formation. Moderate degenerative disc disease is noted at C6-7. Upper chest: Negative. Other: None. IMPRESSION: Acute left tentorial subdural hematoma is noted. Comminuted and mildly displaced nasal bone fracture. Also noted is nondisplaced fracture involving the left lateral maxillary incisor. Degenerative changes are noted in the cervical spine. No acute abnormality seen. Critical Value/emergent results were called by telephone at the time of interpretation on 05/17/2016 at 11:43 am to Dr. Crista Curb , who verbally acknowledged these results. Electronically Signed   By: Lupita Raider, M.D.   On: 05/17/2016 11:46    Anti-infectives: Anti-infectives    Start     Dose/Rate Route Frequency Ordered  Stop  05/17/16 2000  ceFAZolin (ANCEF) IVPB 1 g/50 mL premix     1 g 100 mL/hr over 30 Minutes Intravenous Every 8 hours 05/17/16 1827        Assessment/Plan: Struck by tree branch TBI/tent SDH - exam improved, TBI team therapies, Dr. Franky Macho following Nasal FX and impacted incisor - per Dr. Kenney Houseman, also to see dentist after D/C Nasal and facial lacs - S/P repair by Dr. Ronnald Nian -  IV here per Dr. Kenney Houseman & Keflex PO at D/C for 1 week FEN - hyponatremia worse, KVO IVF, repeat BMET this PM, clears as has some nausea VTE - PAS Dipso - ICU, therapies I spoke with her husband at the bedside.   LOS: 1 day    Violeta Gelinas, MD, MPH, FACS Trauma: [phone] General Surgery: [phone]  1/11/2018Patient ID: Joline Salt, female   DOB: Jul 27, 1955, 61 y.o.   MRN: [id number]

## 2016-05-18 NOTE — Progress Notes (Signed)
Patient ID: Annette Kidd, female   DOB: 1955-11-19, 61 y.o.   MRN: [id number] BP 135/82   Pulse 73   Temp 97.2 F (36.2 C) (Oral)   Resp 12   Ht 5\' 6"  (1.676 m)   Wt 62.8 kg (138 lb 7.2 oz)   SpO2 97%   BMI 22.35 kg/m  Alert and oriented x 4, amnestic for the event Perrl, nose swollen Moving all extremities well Speech is clear and fluent Tongue and uvula midline Improved today, not ready for discharge at this time.

## 2016-05-18 NOTE — Evaluation (Signed)
Physical Therapy Evaluation Patient Details Name: Annette Kidd MRN: [id number] DOB: 02-19-1956 Today's Date: 05/18/2016   History of Present Illness  Patient is a 61 y/o female with no PMH was hit by falling branch while walking her dog. + LOC. GCS of 12 and admitted as level 1 trauma. Found to have a small supratentorial SDH with concussive syndrome of amnesia. She also has a nasal bone fracture and maxilla fracture  Clinical Impression  Patient presents with pain, dizziness and impaired balance/mobility s/p above. Pt does not recall event but otherwise memory seems intact- discussed with spouse as well. Increased time to perform all mobility due to HA, nausea and dizziness. Tolerated short distance ambulation to bathroom with min A for balance/safety. May benefit from use of DME next session. Spouse reports they have family and friends that can provide 24/7 supervision at d/c. Pt needs to be able to negotiate stairs to get to bedroom. Education re: worsening signs of concussive syndrome, importance of mobility etc. Will need to further assess dizziness to r/o any vestibular conditions s/p accident. Will follow acutely to maximize independence and mobility prior to return home.     Follow Up Recommendations Home health PT;Supervision for mobility/OOB;Supervision/Assistance - 24 hour    Equipment Recommendations  Other (comment) (TBA)    Recommendations for Other Services OT consult     Precautions / Restrictions Precautions Precautions: Fall Precaution Comments: dizziness Restrictions Weight Bearing Restrictions: No      Mobility  Bed Mobility Overal bed mobility: Needs Assistance Bed Mobility: Rolling;Sidelying to Sit;Sit to Supine Rolling: Min guard Sidelying to sit: Min guard;HOB elevated   Sit to supine: Supervision   General bed mobility comments: Increased time due to dizziness and pain with movement.   Transfers Overall transfer level: Needs assistance Equipment used: 1  person hand held assist Transfers: Sit to/from Stand Sit to Stand: Min guard         General transfer comment: Min guard to steady in standing. Stood from [employer] x2 with dizziness. VSS.  Ambulation/Gait Ambulation/Gait assistance: Min assist Ambulation Distance (Feet): 20 Feet Assistive device: 1 person hand held assist Gait Pattern/deviations: Step-through pattern;Decreased stride length;Narrow base of support Gait velocity: decreased Gait velocity interpretation: Below normal speed for age/gender General Gait Details: Pt reaching for furniture for support when walking to the bathroom. Min A for balance. + dizziness.  Stairs            Wheelchair Mobility    Modified Rankin (Stroke Patients Only) Modified Rankin (Stroke Patients Only) Pre-Morbid Rankin Score: No symptoms Modified Rankin: Moderately severe disability     Balance Overall balance assessment: Needs assistance Sitting-balance support: Feet supported;Single extremity supported Sitting balance-Leahy Scale: Fair Sitting balance - Comments: Able to perform pericare without assist.    Standing balance support: During functional activity Standing balance-Leahy Scale: Fair Standing balance comment: Requires at least 1 UE support in standnig due to dizziness.                             Pertinent Vitals/Pain Pain Assessment: 0-10 Pain Score: 5  Pain Location: head Pain Descriptors / Indicators: Sore;Headache Pain Intervention(s): Monitored during session;Repositioned;Patient requesting pain meds-RN notified;Limited activity within patient's tolerance    Home Living Family/patient expects to be discharged to:: Private residence Living Arrangements: Spouse/significant other Available Help at Discharge: Family;Available PRN/intermittently Type of Home: House Home Access: Stairs to enter Entrance Stairs-Rails: Right Entrance Stairs-Number of Steps: 2-3 Home Layout: Two  level;Bed/bath  upstairs Home Equipment: None      Prior Function Level of Independence: Independent         Comments: 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      Hand Dominance        Extremity/Trunk Assessment   Upper Extremity Assessment Upper Extremity Assessment: Defer to OT evaluation    Lower Extremity Assessment Lower Extremity Assessment: Generalized weakness    Cervical / Trunk Assessment Cervical / Trunk Assessment: Normal  Communication   Communication: No difficulties (low toned)  Cognition Arousal/Alertness: Lethargic Behavior During Therapy: WFL for tasks assessed/performed Overall Cognitive Status: Within Functional Limits for tasks assessed                 General Comments: Able to recall events prior to accident and remembers waking up in hospital. Recalls anniversary date and is A&Ox4. no notable cognitive deficits at this time but no formal testing performed.    General Comments General comments (skin integrity, edema, etc.): Spouse present during session.     Exercises     Assessment/Plan    PT Assessment Patient needs continued PT services  PT Problem List Decreased strength;Decreased mobility;Decreased balance;Pain;Decreased activity tolerance          PT Treatment Interventions DME instruction;Therapeutic activities;Gait training;Therapeutic exercise;Patient/family education;Balance training;Functional mobility training;Stair training    PT Goals (Current goals can be found in the Care Plan section)  Acute Rehab PT Goals Patient Stated Goal: to feel better PT Goal Formulation: With patient/family Time For Goal Achievement: 06/01/16 Potential to Achieve Goals: Good    Frequency Min 3X/week   Barriers to discharge Inaccessible home environment stairs to enter home    Co-evaluation               End of Session Equipment Utilized During Treatment: Gait belt Activity Tolerance: Patient limited by pain Patient left: in bed;with call bell/phone  within reach;with family/visitor present Nurse Communication: Mobility status         Time: 1320-1350 PT Time Calculation (min) (ACUTE ONLY): 30 min   Charges:   PT Evaluation $PT Eval Moderate Complexity: 1 Procedure PT Treatments $Gait Training: 8-22 mins   PT G Codes:        Kasin Tonkinson A Lataria Courser 05/18/2016, 1:59 PM Mylo RedShauna Wiatt Mahabir, PT, DPT [phone]

## 2016-05-19 LAB — BASIC METABOLIC PANEL
ANION GAP: 9 (ref 5–15)
ANION GAP: 11 (ref 5–15)
ANION GAP: 11 (ref 5–15)
BUN: 6 mg/dL (ref 6–20)
BUN: 5 mg/dL — ABNORMAL LOW (ref 6–20)
BUN: 5 mg/dL — ABNORMAL LOW (ref 6–20)
CHLORIDE: 88 mmol/L — AB (ref 101–111)
CO2: 22 mmol/L (ref 22–32)
CO2: 23 mmol/L (ref 22–32)
CO2: 24 mmol/L (ref 22–32)
Calcium: 8.4 mg/dL — ABNORMAL LOW (ref 8.9–10.3)
Calcium: 8.5 mg/dL — ABNORMAL LOW (ref 8.9–10.3)
Calcium: 8.9 mg/dL (ref 8.9–10.3)
Chloride: 85 mmol/L — ABNORMAL LOW (ref 101–111)
Chloride: 92 mmol/L — ABNORMAL LOW (ref 101–111)
Creatinine, Ser: 0.41 mg/dL — ABNORMAL LOW (ref 0.44–1.00)
Creatinine, Ser: 0.45 mg/dL (ref 0.44–1.00)
Creatinine, Ser: 0.48 mg/dL (ref 0.44–1.00)
GFR calc non Af Amer: >60 mL/min (ref >60)
GLUCOSE: 121 mg/dL — AB (ref 65–99)
Glucose, Bld: 131 mg/dL — ABNORMAL HIGH (ref 65–99)
Glucose, Bld: 133 mg/dL — ABNORMAL HIGH (ref 65–99)
POTASSIUM: 4.2 mmol/L (ref 3.5–5.1)
POTASSIUM: 4.3 mmol/L (ref 3.5–5.1)
Potassium: 3.9 mmol/L (ref 3.5–5.1)
SODIUM: 119 mmol/L — AB (ref 135–145)
SODIUM: 127 mmol/L — AB (ref 135–145)
Sodium: 119 mmol/L — CL (ref 135–145)

## 2016-05-19 LAB — BLOOD PRODUCT ORDER (VERBAL) VERIFICATION

## 2016-05-19 LAB — SODIUM, URINE, RANDOM: SODIUM UR: 147 mmol/L

## 2016-05-19 MED ORDER — SODIUM CHLORIDE 3 % IV SOLN
INTRAVENOUS | Status: DC
  Administered 2016-05-19: 50 mL/h via INTRAVENOUS
  Administered 2016-05-19: 30 mL/h via INTRAVENOUS
  Filled 2016-05-19 (×3): qty 500

## 2016-05-19 NOTE — Evaluation (Signed)
Occupational Therapy Evaluation Patient Details Name: Annette Kidd MRN: [id number] DOB: 10-Nov-1955 Today's Date: 05/19/2016    History of Present Illness Patient is a 61 y/o female with no PMH was hit by falling branch while walking her dog. + LOC. GCS of 12 and admitted as level 1 trauma. Found to have a small supratentorial SDH with concussive syndrome of amnesia. She also has a nasal bone fracture and maxilla fracture   Clinical Impression   This 61 yo female admitted with above presents to acute OT with deficits below (see OT problem list) thus affecting her PLOF of totally independent and working as a Tax inspectorlandscape designer. She will benefit from acute OT with follow up HHOT.    Follow Up Recommendations  Home health OT;Supervision/Assistance - 24 hour    Equipment Recommendations  Other (comment) (have made family aware that I feel pt would benefit from shower seat, they will get one on their own if they feel she needs one once they get home)       Precautions / Restrictions Precautions Precautions: Fall Precaution Comments: dizziness; nausea Restrictions Weight Bearing Restrictions: No      Mobility Bed Mobility Overal bed mobility: Needs Assistance Bed Mobility: Sidelying to Sit;Sit to Supine   Sidelying to sit: Min guard;HOB elevated   Sit to supine: Min guard      Transfers Overall transfer level: Needs assistance Equipment used: 1 person hand held assist Transfers: Sit to/from Stand Sit to Stand: Min guard              Balance Overall balance assessment: Needs assistance Sitting-balance support: No upper extremity supported;Feet supported Sitting balance-Leahy Scale: Fair     Standing balance support: During functional activity;Single extremity supported   Standing balance comment: Requires at least 1 UE support in standnig due to dizziness.                            ADL Overall ADL's : Needs assistance/impaired Eating/Feeding:  Independent;Sitting   Grooming: Supervision/safety;Set up;Sitting   Upper Body Bathing: Supervision/ safety;Set up;Sitting   Lower Body Bathing: Min guard;Sit to/from stand   Upper Body Dressing : Set up;Supervision/safety;Sitting   Lower Body Dressing: Min guard;Sit to/from stand   Toilet Transfer: Min guard;Ambulation;Regular Toilet;Grab bars   Toileting- Clothing Manipulation and Hygiene: Min guard;Sit to/from stand               Vision Vision Assessment?: Yes Eye Alignment: Within Functional Limits Ocular Range of Motion: Within Functional Limits Alignment/Gaze Preference: Within Defined Limits Tracking/Visual Pursuits: Able to track stimulus in all quads without difficulty Convergence: Within functional limits Visual Fields: No apparent deficits          Pertinent Vitals/Pain Pain Assessment: Faces Faces Pain Scale: Hurts even more Pain Location: head and back to of neck Pain Descriptors / Indicators: Headache;Aching Pain Intervention(s): Monitored during session;Repositioned;Limited activity within patient's tolerance     Hand Dominance Right   Extremity/Trunk Assessment Upper Extremity Assessment Upper Extremity Assessment: Generalized weakness           Communication Communication Communication:  (hard to understand at times)   Cognition Arousal/Alertness: Lethargic Behavior During Therapy: Flat affect Overall Cognitive Status: Within Functional Limits for tasks assessed                 General Comments: Delayed response to questions asked. Requires lots of coaxing.  Home Living Family/patient expects to be discharged to:: Private residence                   Bathroom Shower/Tub: Walk-in shower;Curtain                             OT Problem List: Impaired balance (sitting and/or standing);Decreased cognition   OT Treatment/Interventions: Self-care/ADL training;Therapeutic  activities;Visual/perceptual remediation/compensation;DME and/or AE instruction;Patient/family education;Balance training    OT Goals(Current goals can be found in the care plan section) Acute Rehab OT Goals Patient Stated Goal: to feel better OT Goal Formulation: With patient Time For Goal Achievement: 05/26/16 Potential to Achieve Goals: Good  OT Frequency: Min 3X/week              End of Session    Activity Tolerance: Patient tolerated treatment well Patient left: in bed;with call bell/phone within reach;with nursing/sitter in room;with family/visitor present   Time: 1610-9604 OT Time Calculation (min): 30 min Charges:  OT General Charges $OT Visit: 1 Procedure OT Evaluation $OT Eval Moderate Complexity: 1 Procedure OT Treatments $Self Care/Home Management : 8-22 mins  Evette Georges [phone] 05/19/2016, 4:06 PM

## 2016-05-19 NOTE — Progress Notes (Signed)
Physical Therapy Treatment Patient Details Name: Annette Kidd Comins MRN: [id number] DOB: 07-22-1955 Today's Date: 05/19/2016    History of Present Illness Patient is a 61 y/o female with no PMH was hit by falling branch while walking her dog. + LOC. GCS of 12 and admitted as level 1 trauma. Found to have a small supratentorial SDH with concussive syndrome of amnesia. She also has a nasal bone fracture and maxilla fracture    PT Comments    Patient progressing slowly towards PT goals. Requires max encouragement to participate in mobility. Increased time to perform all activity due to pain and nausea. Tolerated gait training with Min A for balance/safety- pt with much difficulty navigating RW. Will most likely do better with handheld assist. Encouraged OOB to chair more frequently. Continues to be limited by nausea and headache. Will continue to follow.   Follow Up Recommendations  Home health PT;Supervision for mobility/OOB;Supervision/Assistance - 24 hour     Equipment Recommendations  Other (comment) (TBA)    Recommendations for Other Services       Precautions / Restrictions Precautions Precautions: Fall Precaution Comments: dizziness; nausea Restrictions Weight Bearing Restrictions: No    Mobility  Bed Mobility Overal bed mobility: Needs Assistance Bed Mobility: Rolling;Sidelying to Sit Rolling: Supervision Sidelying to sit: Min guard;HOB elevated       General bed mobility comments: Increased time due to dizziness and pain with movement.   Transfers Overall transfer level: Needs assistance Equipment used: Rolling walker (2 wheeled) Transfers: Sit to/from Stand Sit to Stand: Min guard         General transfer comment: Min guard to steady in standing. Cues for hand placement/technique. Stood from [employer] x1, from toilet x1, transferred to chair post ambulation.  Ambulation/Gait Ambulation/Gait assistance: Min assist Ambulation Distance (Feet): 10 Feet (+15' +  40') Assistive device: Rolling walker (2 wheeled) Gait Pattern/deviations: Step-through pattern;Decreased stride length;Staggering right;Staggering left;Trunk flexed Gait velocity: decreased Gait velocity interpretation: Below normal speed for age/gender General Gait Details: Pt with increased trunk flexion with RW too far anterior, cues for RW proximity./management. Pt did not do well with use of RW. VSS. 2 seated rest breaks.   Stairs            Wheelchair Mobility    Modified Rankin (Stroke Patients Only) Modified Rankin (Stroke Patients Only) Pre-Morbid Rankin Score: No symptoms Modified Rankin: Moderately severe disability     Balance Overall balance assessment: Needs assistance Sitting-balance support: Feet supported;No upper extremity supported Sitting balance-Leahy Scale: Fair Sitting balance - Comments: Able to perform pericare without assist.    Standing balance support: During functional activity;Single extremity supported Standing balance-Leahy Scale: Fair Standing balance comment: Requires at least 1 UE support in standnig due to dizziness.                    Cognition Arousal/Alertness: Lethargic Behavior During Therapy: Flat affect Overall Cognitive Status: Within Functional Limits for tasks assessed                 General Comments: Delayed response to questions asked. Requires lots of coaxing.    Exercises      General Comments General comments (skin integrity, edema, etc.): Spouse and daughter present. Education re: importance of mobility and being OOB in chair as well as diet.      Pertinent Vitals/Pain Pain Assessment: Faces Faces Pain Scale: Hurts even more Pain Location: head Pain Descriptors / Indicators: Headache;Aching Pain Intervention(s): Monitored during session;Repositioned;Limited activity within patient's tolerance  Home Living                      Prior Function            PT Goals (current goals  can now be found in the care plan section) Progress towards PT goals: Progressing toward goals    Frequency    Min 3X/week      PT Plan Current plan remains appropriate    Co-evaluation             End of Session Equipment Utilized During Treatment: Gait belt Activity Tolerance: Patient limited by pain;Other (comment) (nausea) Patient left: in chair;with call bell/phone within reach;with family/visitor present;with SCD's reapplied     Time: 1031-1105 PT Time Calculation (min) (ACUTE ONLY): 34 min  Charges:  $Gait Training: 23-37 mins                    G Codes:      Tamala Manzer A Suki Crockett 05/19/2016, 11:22 AM Mylo Red, PT, DPT [phone]

## 2016-05-19 NOTE — Progress Notes (Signed)
Subjective: Mild nausea, pain control OK  Objective: Vital signs in last 24 hours: Temp:  [97.2 F (36.2 C)-98.4 F (36.9 C)] 98 F (36.7 C) (01/12 0400) Pulse Rate:  [69-82] 70 (01/12 0700) Resp:  [11-21] 12 (01/12 0700) BP: (118-144)/(78-92) 122/81 (01/12 0700) SpO2:  [95 %-99 %] 97 % (01/12 0700)    Intake/Output from previous day: 01/11 0701 - 01/12 0700 In: 357.9 [I.V.:307.9; IV Piggyback:50] Out: -  Intake/Output this shift: No intake/output data recorded.  General appearance: alert and cooperative Head: nasal and facial lacs Resp: clear to auscultation bilaterally Cardio: regular rate and rhythm GI: soft, NT, ND Extremities: calves soft  Neuro: Oriented, MAE, speech fluent  Lab Results: CBC   Recent Labs  05/17/16 1000 05/18/16 0530  WBC 6.6 12.6*  HGB 13.3 11.6*  HCT 38.7 33.2*  PLT 278 219   BMET  Recent Labs  05/18/16 1427 05/19/16 0400  NA 121* 119*  K 3.9 3.9  CL 92* 88*  CO2 20* 22  GLUCOSE 135* 121*  BUN 6 6  CREATININE 0.41* 0.41*  CALCIUM 8.5* 8.4*   PT/INR No results for input(s): LABPROT, INR in the last 72 hours. ABG No results for input(s): PHART, HCO3 in the last 72 hours.  Invalid input(s): PCO2, PO2  Studies/Results: Ct Head Wo Contrast  Result Date: 05/17/2016 CLINICAL DATA:  De HollingsheadFallen tree branch hit face. EXAM: CT HEAD WITHOUT CONTRAST CT MAXILLOFACIAL WITHOUT CONTRAST CT CERVICAL SPINE WITHOUT CONTRAST TECHNIQUE: Multidetector CT imaging of the head, cervical spine, and maxillofacial structures were performed using the standard protocol without intravenous contrast. Multiplanar CT image reconstructions of the cervical spine and maxillofacial structures were also generated. COMPARISON:  None. FINDINGS: CT HEAD FINDINGS Brain: Left tentorial subdural hematoma is noted. No mass effect or midline shift is noted. Ventricular size is within normal limits. There is no evidence of mass lesion or acute infarction. Vascular: No  hyperdense vessel or unexpected calcification. Skull: Normal. Negative for fracture or focal lesion. Other: Small left occipital scalp hematoma is noted. CT MAXILLOFACIAL FINDINGS Osseous: Comminuted and mildly displaced fracture is seen involving the nasal bones. Nondisplaced fracture involving left lateral incisor of maxilla is noted. Orbits: Negative. No traumatic or inflammatory finding. Sinuses: Clear. Soft tissues: Negative. CT CERVICAL SPINE FINDINGS Alignment: Normal. Skull base and vertebrae: No acute fracture. No primary bone lesion or focal pathologic process. Soft tissues and spinal canal: No prevertebral fluid or swelling. No visible canal hematoma. Disc levels: Mild degenerative disc disease is noted at C5-6 with posterior osteophyte formation. Moderate degenerative disc disease is noted at C6-7. Upper chest: Negative. Other: None. IMPRESSION: Acute left tentorial subdural hematoma is noted. Comminuted and mildly displaced nasal bone fracture. Also noted is nondisplaced fracture involving the left lateral maxillary incisor. Degenerative changes are noted in the cervical spine. No acute abnormality seen. Critical Value/emergent results were called by telephone at the time of interpretation on 05/17/2016 at 11:43 am to Dr. Crista CurbANA LIU , who verbally acknowledged these results. Electronically Signed   By: Lupita RaiderJames  Green Jr, M.D.   On: 05/17/2016 11:46   Ct Cervical Spine Wo Contrast  Result Date: 05/17/2016 CLINICAL DATA:  De HollingsheadFallen tree branch hit face. EXAM: CT HEAD WITHOUT CONTRAST CT MAXILLOFACIAL WITHOUT CONTRAST CT CERVICAL SPINE WITHOUT CONTRAST TECHNIQUE: Multidetector CT imaging of the head, cervical spine, and maxillofacial structures were performed using the standard protocol without intravenous contrast. Multiplanar CT image reconstructions of the cervical spine and maxillofacial structures were also generated. COMPARISON:  None. FINDINGS:  CT HEAD FINDINGS Brain: Left tentorial subdural hematoma is  noted. No mass effect or midline shift is noted. Ventricular size is within normal limits. There is no evidence of mass lesion or acute infarction. Vascular: No hyperdense vessel or unexpected calcification. Skull: Normal. Negative for fracture or focal lesion. Other: Small left occipital scalp hematoma is noted. CT MAXILLOFACIAL FINDINGS Osseous: Comminuted and mildly displaced fracture is seen involving the nasal bones. Nondisplaced fracture involving left lateral incisor of maxilla is noted. Orbits: Negative. No traumatic or inflammatory finding. Sinuses: Clear. Soft tissues: Negative. CT CERVICAL SPINE FINDINGS Alignment: Normal. Skull base and vertebrae: No acute fracture. No primary bone lesion or focal pathologic process. Soft tissues and spinal canal: No prevertebral fluid or swelling. No visible canal hematoma. Disc levels: Mild degenerative disc disease is noted at C5-6 with posterior osteophyte formation. Moderate degenerative disc disease is noted at C6-7. Upper chest: Negative. Other: None. IMPRESSION: Acute left tentorial subdural hematoma is noted. Comminuted and mildly displaced nasal bone fracture. Also noted is nondisplaced fracture involving the left lateral maxillary incisor. Degenerative changes are noted in the cervical spine. No acute abnormality seen. Critical Value/emergent results were called by telephone at the time of interpretation on 05/17/2016 at 11:43 am to Dr. Crista Curb , who verbally acknowledged these results. Electronically Signed   By: Lupita Raider, M.D.   On: 05/17/2016 11:46   Ct Maxillofacial Wo Contrast  Result Date: 05/17/2016 CLINICAL DATA:  De Hollingshead tree branch hit face. EXAM: CT HEAD WITHOUT CONTRAST CT MAXILLOFACIAL WITHOUT CONTRAST CT CERVICAL SPINE WITHOUT CONTRAST TECHNIQUE: Multidetector CT imaging of the head, cervical spine, and maxillofacial structures were performed using the standard protocol without intravenous contrast. Multiplanar CT image reconstructions  of the cervical spine and maxillofacial structures were also generated. COMPARISON:  None. FINDINGS: CT HEAD FINDINGS Brain: Left tentorial subdural hematoma is noted. No mass effect or midline shift is noted. Ventricular size is within normal limits. There is no evidence of mass lesion or acute infarction. Vascular: No hyperdense vessel or unexpected calcification. Skull: Normal. Negative for fracture or focal lesion. Other: Small left occipital scalp hematoma is noted. CT MAXILLOFACIAL FINDINGS Osseous: Comminuted and mildly displaced fracture is seen involving the nasal bones. Nondisplaced fracture involving left lateral incisor of maxilla is noted. Orbits: Negative. No traumatic or inflammatory finding. Sinuses: Clear. Soft tissues: Negative. CT CERVICAL SPINE FINDINGS Alignment: Normal. Skull base and vertebrae: No acute fracture. No primary bone lesion or focal pathologic process. Soft tissues and spinal canal: No prevertebral fluid or swelling. No visible canal hematoma. Disc levels: Mild degenerative disc disease is noted at C5-6 with posterior osteophyte formation. Moderate degenerative disc disease is noted at C6-7. Upper chest: Negative. Other: None. IMPRESSION: Acute left tentorial subdural hematoma is noted. Comminuted and mildly displaced nasal bone fracture. Also noted is nondisplaced fracture involving the left lateral maxillary incisor. Degenerative changes are noted in the cervical spine. No acute abnormality seen. Critical Value/emergent results were called by telephone at the time of interpretation on 05/17/2016 at 11:43 am to Dr. Crista Curb , who verbally acknowledged these results. Electronically Signed   By: Lupita Raider, M.D.   On: 05/17/2016 11:46    Anti-infectives: Anti-infectives    Start     Dose/Rate Route Frequency Ordered Stop   05/17/16 2000  ceFAZolin (ANCEF) IVPB 1 g/50 mL premix     1 g 100 mL/hr over 30 Minutes Intravenous Every 8 hours 05/17/16 1827  Assessment/Plan: Struck by tree branch TBI/tent SDH - exam improved, TBI team therapies, Dr. Franky Macho following Nasal FX and impacted incisor - per Dr. Kenney Houseman, also to see dentist after D/C Nasal and facial lacs - S/P repair by Dr. Ronnald Nian -  IV here per Dr. Kenney Houseman & Keflex PO at D/C for 1 week FEN - hyponatremia worse, neuro exam OK, start 3% saline at 30cc/h, check Na at 1200 and 2000, check urine sodium. Soft diet. VTE - PAS Dipso - ICU, therapies   LOS: 2 days    Violeta Gelinas, MD, MPH, FACS Trauma: [phone] General Surgery: [phone]  1/12/2018Patient ID: Annette Kidd, female   DOB: 17-Feb-1956, 61 y.o.   MRN: [id number]

## 2016-05-19 NOTE — Progress Notes (Signed)
Patient ID: Annette Kidd, female   DOJoline SaltB: 01-02-1956, 61 y.o.   MRN: [id number]  BP 136/77   Pulse 74   Temp 98.1 F (36.7 C) (Oral)   Resp 12   Ht 5\' 6"  (1.676 m)   Wt 62.8 kg (138 lb 7.2 oz)   SpO2 97%   BMI 22.35 kg/m  Alert and oriented x 4, speech Is clear and fluent Perrl, full eom Tongue and uvula midline Moving all extremities well Remains hyponatremic despite aggressive replacement with 3% Na Continuing to improve No new recommendations

## 2016-05-19 NOTE — Progress Notes (Signed)
Patient ID: Annette Kidd, female   DOB: 05/28/1955, 61 y.o.   MRN: [id number] I spoke with her husband and daughter at the bedside. Na still 119 - increase 3% saline. BMET at 2000. Annette GelinasBurke Gabriella Guile, MD, MPH, FACS Trauma: [phone] General Surgery: [phone]

## 2016-05-19 NOTE — Progress Notes (Signed)
CRITICAL VALUE ALERT  Critical value received: NA 119  Date of notification:  05/19/16  Time of notification: 0525  Critical value read back:Yes.    Nurse who received alert:  Hector BrunswickE. Eliu Batch RN  MD notified (1st page): Magnus IvanBlackman  Time of first page: 0526  MD notified (2nd page):  Time of second page:  Responding MD:  Magnus IvanBlackman   Time MD responded: [phone]  No new orders. Will continue to monitor. Dicie BeamFrazier, Mikel Pyon RN BSN

## 2016-05-19 NOTE — Clinical Social Work Note (Signed)
Clinical Social Work Assessment  Patient Details  Name: Annette Kidd MRN: [id number] Date of Birth: 02-10-1956  Date of referral:  05/19/16               Reason for consult:  Trauma                Permission sought to share information with:  Family Supports Permission granted to share information::     Name::     Spouse and Dtr  Agency::     Relationship::     Contact Information:     Housing/Transportation Living arrangements for the past 2 months:  Single Family Home (2 Ext stairs and 10-12 stairs to get to bedroom) Source of Information:  Adult Children, Spouse Patient Interpreter Needed:  None Criminal Activity/Legal Involvement Pertinent to Current Situation/Hospitalization:  No - Comment as needed Significant Relationships:  Adult Children, Spouse Lives with:  Spouse Do you feel safe going back to the place where you live?  Yes Need for family participation in patient care:  Yes (Comment)  Care giving concerns:  No care giving concerns. Pt's spouse Annette Kidd available and willing to give pt 24 hr care. Annette Kidd also reports that friends and family are willing to help pt post DC.   Social Worker assessment / plan:  Holiday representative met with pt at bedside to offer support and discuss patient needs at discharge. Present was pt's spouse and dtr Annette Kidd. Spouse reported that they live in a single family home with 2 exterior stairs and 10-12 interior stairs. Spouse is considering putting pt on first level so that she doesn't have to go up/down stairs. Pt's dtr Annette Kidd who was present  resides in [place] but will be making frequent trips home to support her mother, at least until pt is @ baseline. CSW discussed PT's recommendations for 24 hr care and HHC, the family is on board with all  Recommendations and report friends and family willing to pitch in too.   Spouse discussed accident and to his knowledge pt was walking their dog and a tree limb or tree fell and struck pt in her face. Spouse  reports that pt is unclear about what happened and lose consciousness. Pt unclear about what happened.  SBIRT complete. No further social work needs identified at this time. Employment status:  Retired Forensic scientist:  Other (Comment Required) Psychologist, counselling) PT Recommendations:  Home with [place], [place] / Referral to community resources:     Patient/Family's Response to care:  Pt agreeable with returning home with family. Pt does not express concerns with nightmares and/or flashbacks at this time.  Patient/Family's Understanding of and Emotional Response to Diagnosis, Current Treatment, and Prognosis:  Pt/Family understanding of her current dx and potential long term recovery. Pt with good family support who plans to be present for physical and emotional support at discharge.  Emotional Assessment Appearance:  Appears stated age Attitude/Demeanor/Rapport:  Unable to Assess Affect (typically observed):  Unable to Assess Orientation:  Oriented to  Time, Oriented to Place, Oriented to Situation, Oriented to Self Alcohol / Substance use:  Never Used Psych involvement (Current and /or in the community):  No (Comment)  Discharge Needs  Concerns to be addressed:    Readmission within the last 30 days:  No Current discharge risk:  None Barriers to Discharge:  Continued Medical Work up   [place], [place], [place] 05/19/2016, 2:15 PM

## 2016-05-20 ENCOUNTER — Encounter (HOSPITAL_COMMUNITY): Payer: Self-pay | Admitting: Acute Care

## 2016-05-20 LAB — BASIC METABOLIC PANEL
Anion gap: 9 (ref 5–15)
Anion gap: 7 (ref 5–15)
BUN: 6 mg/dL (ref 6–20)
BUN: 6 mg/dL (ref 6–20)
CALCIUM: 8.7 mg/dL — AB (ref 8.9–10.3)
CALCIUM: 8.6 mg/dL — AB (ref 8.9–10.3)
CO2: 24 mmol/L (ref 22–32)
CO2: 26 mmol/L (ref 22–32)
CREATININE: 0.48 mg/dL (ref 0.44–1.00)
CREATININE: 0.48 mg/dL (ref 0.44–1.00)
Chloride: 95 mmol/L — ABNORMAL LOW (ref 101–111)
Chloride: 93 mmol/L — ABNORMAL LOW (ref 101–111)
GFR calc Af Amer: >60 mL/min (ref >60)
GFR calc Af Amer: >60 mL/min (ref >60)
Glucose, Bld: 116 mg/dL — ABNORMAL HIGH (ref 65–99)
Glucose, Bld: 137 mg/dL — ABNORMAL HIGH (ref 65–99)
POTASSIUM: 3.8 mmol/L (ref 3.5–5.1)
Potassium: 4.2 mmol/L (ref 3.5–5.1)
SODIUM: 128 mmol/L — AB (ref 135–145)
SODIUM: 126 mmol/L — AB (ref 135–145)

## 2016-05-20 MED ORDER — SODIUM CHLORIDE 1 G PO TABS
2.0000 g | ORAL_TABLET | Freq: Three times a day (TID) | ORAL | Status: DC
  Administered 2016-05-20 – 2016-05-21 (×4): 2 g via ORAL
  Filled 2016-05-20 (×6): qty 2

## 2016-05-20 MED ORDER — SODIUM CHLORIDE 0.9% FLUSH
10.0000 mL | Freq: Two times a day (BID) | INTRAVENOUS | Status: DC
  Administered 2016-05-21 – 2016-05-27 (×8): 10 mL via INTRAVENOUS

## 2016-05-20 MED ORDER — SODIUM CHLORIDE 0.9% FLUSH
3.0000 mL | INTRAVENOUS | Status: DC | PRN
  Administered 2016-05-25: 3 mL via INTRAVENOUS
  Filled 2016-05-20: qty 3

## 2016-05-20 MED ORDER — SODIUM CHLORIDE 0.9% FLUSH
3.0000 mL | Freq: Two times a day (BID) | INTRAVENOUS | Status: DC
  Administered 2016-05-20 – 2016-05-29 (×14): 3 mL via INTRAVENOUS

## 2016-05-20 NOTE — Progress Notes (Signed)
  Subjective: Pt doing well.  Pt with increasing Na.  3% NA infusion held  Objective: Vital signs in last 24 hours: Temp:  [98 F (36.7 C)-98.9 F (37.2 C)] 98.8 F (37.1 C) (01/13 0800) Pulse Rate:  [70-91] 74 (01/13 0700) Resp:  [10-23] 10 (01/13 0700) BP: (103-143)/(59-91) 126/74 (01/13 0700) SpO2:  [96 %-98 %] 97 % (01/13 0700)    Intake/Output from previous day: 01/12 0701 - 01/13 0700 In: 701.3 [I.V.:601.3; IV Piggyback:100] Out: 2500 [Urine:2500] Intake/Output this shift: No intake/output data recorded.  General appearance: alert and cooperative GI: soft, non-tender; bowel sounds normal; no masses,  no organomegaly  Lab Results:   Recent Labs  05/17/16 1000 05/18/16 0530  WBC 6.6 12.6*  HGB 13.3 11.6*  HCT 38.7 33.2*  PLT 278 219   BMET  Recent Labs  05/19/16 2000 05/20/16 0243  NA 127* 128*  K 4.3 4.2  CL 92* 95*  CO2 24 24  GLUCOSE 133* 116*  BUN 5* 6  CREATININE 0.48 0.48  CALCIUM 8.9 8.7*   PT/INR No results for input(s): LABPROT, INR in the last 72 hours. ABG No results for input(s): PHART, HCO3 in the last 72 hours.  Invalid input(s): PCO2, PO2  Studies/Results: No results found.  Anti-infectives: Anti-infectives    Start     Dose/Rate Route Frequency Ordered Stop   05/17/16 2000  ceFAZolin (ANCEF) IVPB 1 g/50 mL premix     1 g 100 mL/hr over 30 Minutes Intravenous Every 8 hours 05/17/16 1827        Assessment/Plan: Struck by tree branch TBI/tent SDH - exam improved, TBI team therapies, Dr. Franky Machoabbell following Nasal FX and impacted incisor - per Dr. Kenney Housemanrab, also to see dentist after D/C Nasal and facial lacs - S/P repair by Dr. Ronnald Nianrab Ancef -  IV here per Dr. Kenney Housemanrab & Keflex PO at D/C for 1 week FEN - hypoNa improving.  recheckl BMP at noon.  May restart salt pills. Soft diet. VTE - PAS Dipso - ICU, therapies   LOS: 3 days    Marigene Ehlersamirez Jr., [facility] 05/20/2016

## 2016-05-20 NOTE — Progress Notes (Signed)
Overall doing well. Much less confused today. Eating and interactive with family.  Awake and alert. Oriented and reasonably appropriate. Motor and sensory function extremities intact. Cranial nerve function intact. No evidence of CSF rhinorrhea.  Sodium has increased to 129. 3% saline discontinued. We will monitor sodium to see if she is able to maintain normal salt balance.

## 2016-05-20 NOTE — Progress Notes (Signed)
[place] called to [place], RN on 4E and patient notified of transfer to SDU.  Oriented to unit routines and to nursing staff.  Patient ambulated approx. 350 feet with physical therapy and SBA.  Accompanied by husband.  MD aware of hyponatremia and order left for sodium tablets to be given TID with meals.

## 2016-05-20 NOTE — Evaluation (Signed)
Speech Language Pathology Evaluation Patient Details Name: Annette Kidd MRN: [id number] DOB: 11/21/1955 Today's Date: 05/20/2016 Time: [id number] SLP Time Calculation (min) (ACUTE ONLY): 23 min  Problem List:  Patient Active Problem List   Diagnosis Date Noted  . TBI (traumatic brain injury) (HCC) 05/17/2016   Past Medical History: History reviewed. No pertinent past medical history. Past Surgical History:  Past Surgical History:  Procedure Laterality Date  . APPENDECTOMY     HPI:  Patient is a 61 y/o female with no PMH was hit by falling branch while walking her dog. + LOC. GCS of 12 and admitted as level 1 trauma. Found to have a small supratentorial SDH with concussive syndrome of amnesia. She also has a nasal bone fracture and maxilla fracture. Head CT 05/17/16 showed acute left tentorial subdural hematoma.   Assessment / Plan / Recommendation Clinical Impression  Administered MOCA; patient scored 27/30 indicating no cognitive communication impairment. Patient was observed to self-monitor and correct mistakes during clock-drawing task. She reports her memory of the day of her accident is "not good," confirms she is able to remember events day-to-day since her hospitalization, including visitors and phone calls. She is a Tax inspectorlandscape designer. Provided education regarding potential impacts of concussion on cognitive communication function and advised patient to follow up in OP clinic for SLP services if function is different from baseline upon return to activities at home/work. Patient and husband verbalize understanding and are in agreement with this plan.     SLP Assessment  All further Speech Lanaguage Pathology  needs can be addressed in the next venue of care    Follow Up Recommendations  Outpatient SLP    Frequency and Duration           SLP Evaluation Cognition  Overall Cognitive Status: Within Functional Limits for tasks assessed Arousal/Alertness: Awake/alert Orientation  Level: Oriented X4       Comprehension  Auditory Comprehension Overall Auditory Comprehension: Appears within functional limits for tasks assessed Visual Recognition/Discrimination Discrimination: Within Function Limits Reading Comprehension Reading Status: Not tested    Expression Expression Primary Mode of Expression: Verbal Verbal Expression Overall Verbal Expression: Appears within functional limits for tasks assessed Written Expression Dominant Hand: Right Written Expression: Not tested   Oral / Motor  Oral Motor/Sensory Function Overall Oral Motor/Sensory Function: Within functional limits Motor Speech Overall Motor Speech: Appears within functional limits for tasks assessed   GO            Annette BatonMary Beth Charlcie Prisco, MS CF-SLP Speech-Language Pathologist          Annette LindauMary E Alleene Kidd 05/20/2016, 12:21 PM

## 2016-05-20 NOTE — Progress Notes (Signed)
Occupational Therapy Treatment Patient Details Name: Annette Kidd MRN: [id number] DOB: 1955-11-03 Today's Date: 05/20/2016    History of present illness Patient is a 61 y/o female with no PMH was hit by falling branch while walking her dog. + LOC. GCS of 12 and admitted as level 1 trauma. Found to have a small supratentorial SDH with concussive syndrome of amnesia. She also has a nasal bone fracture and maxilla fracture   OT comments  Pt requires min guard assist for ADLs and cues to initiate activities.  She is slow to respond to questions/instruction and does not self initiate without cuing   Follow Up Recommendations  Supervision/Assistance - 24 hour;Outpatient OT    Equipment Recommendations  Other (comment) (family will acquire tub seat if needed )    Recommendations for Other Services      Precautions / Restrictions Precautions Precautions: Fall Precaution Comments: mild dizziness when standing  Restrictions Weight Bearing Restrictions: No       Mobility Bed Mobility Overal bed mobility: Needs Assistance Bed Mobility: Supine to Sit Rolling: Supervision         General bed mobility comments: cues to initiate   Transfers Overall transfer level: Needs assistance Equipment used: 1 person hand held assist Transfers: Sit to/from Stand;Stand Pivot Transfers Sit to Stand: Min guard Stand pivot transfers: Min guard       General transfer comment: min guard due to balance deficits     Balance Overall balance assessment: Needs assistance Sitting-balance support: Feet supported Sitting balance-Leahy Scale: Fair     Standing balance support: During functional activity Standing balance-Leahy Scale: Fair                     ADL Overall ADL's : Needs assistance/impaired                         Toilet Transfer: Min guard;Ambulation Toilet Transfer Details (indicate cue type and reason): pt with staggering gait, but no LOB  Toileting- Clothing  Manipulation and Hygiene: Min guard;Sit to/from stand       Functional mobility during ADLs: Min guard General ADL Comments: Pt with decreased visual scanning speed while ambulating.  Is easily distracted by pain and injuries.  Instructed pt and spouse on symptoms of mild TBI       Vision                     Perception     Praxis      Cognition   Behavior During Therapy: Flat affect Overall Cognitive Status: Impaired/Different from baseline                  General Comments: Pt is slow to initiate activity, and responses are mildly delayed     Extremity/Trunk Assessment               Exercises     Shoulder Instructions       General Comments      Pertinent Vitals/ Pain       Pain Assessment: Faces Faces Pain Scale: Hurts little more Pain Location: neck and shoulders  Pain Descriptors / Indicators: Squeezing;Pressure;Aching Pain Intervention(s): Monitored during session  Home Living                                          Prior Functioning/Environment  Frequency  Min 3X/week        Progress Toward Goals  OT Goals(current goals can now be found in the care plan section)  Progress towards OT goals: Progressing toward goals     Plan Discharge plan needs to be updated    Co-evaluation                 End of Session Equipment Utilized During Treatment: Gait belt   Activity Tolerance Patient tolerated treatment well   Patient Left Other (comment) (w/c for transport to 4E)   Nurse Communication Mobility status        Time: 0981-1914 OT Time Calculation (min): 30 min  Charges: OT General Charges $OT Visit: 1 Procedure OT Treatments $Therapeutic Activity: 23-37 mins  Charlaine Utsey M 05/20/2016, 4:58 PM

## 2016-05-21 LAB — BASIC METABOLIC PANEL
ANION GAP: 9 (ref 5–15)
BUN: 7 mg/dL (ref 6–20)
CHLORIDE: 89 mmol/L — AB (ref 101–111)
CO2: 27 mmol/L (ref 22–32)
CREATININE: 0.47 mg/dL (ref 0.44–1.00)
Calcium: 8.8 mg/dL — ABNORMAL LOW (ref 8.9–10.3)
GFR calc non Af Amer: >60 mL/min (ref >60)
Glucose, Bld: 112 mg/dL — ABNORMAL HIGH (ref 65–99)
POTASSIUM: 4.1 mmol/L (ref 3.5–5.1)
SODIUM: 125 mmol/L — AB (ref 135–145)

## 2016-05-21 MED ORDER — SODIUM CHLORIDE 1 G PO TABS
3.0000 g | ORAL_TABLET | Freq: Three times a day (TID) | ORAL | Status: DC
  Administered 2016-05-22 – 2016-05-23 (×6): 3 g via ORAL
  Filled 2016-05-21 (×6): qty 3

## 2016-05-21 MED ORDER — SODIUM CHLORIDE 1 G PO TABS
3.0000 g | ORAL_TABLET | Freq: Once | ORAL | Status: AC
  Administered 2016-05-21: 3 g via ORAL
  Filled 2016-05-21: qty 3

## 2016-05-21 MED ORDER — BOOST / RESOURCE BREEZE PO LIQD
1.0000 | Freq: Two times a day (BID) | ORAL | Status: DC
  Administered 2016-05-21 – 2016-05-28 (×5): 1 via ORAL

## 2016-05-21 NOTE — Progress Notes (Signed)
Overall stable. Complains of some headache.  Afebrile. Vitals are stable. Awake and alert. Oriented and appropriate. Sodium is tracking down again worrisome for SIADH. No new recommendations from my standpoint.

## 2016-05-21 NOTE — Progress Notes (Signed)
  [facility] report she is eating at least 1/2 food on tray. Report that she feels warm and is sweating.   Objective: Vital signs in last 24 hours: Temp:  [98.3 F (36.8 C)-99.2 F (37.3 C)] 99.2 F (37.3 C) (01/14 1213) Pulse Rate:  [73-80] 73 (01/14 1213) Resp:  [10-17] 17 (01/14 1213) BP: (106-129)/(66-79) 122/79 (01/14 1213) SpO2:  [97 %-99 %] 99 % (01/14 1213) Last BM Date: 05/17/16  Intake/Output from previous day: 01/13 0701 - 01/14 0700 In: 300 [P.O.:300] Out: -  Intake/Output this shift: No intake/output data recorded.  Awake, FC x4 cta b/l Reg Soft, nt, nd No edema, good cap refill  Lab Results:  No results for input(s): WBC, HGB, HCT, PLT in the last 72 hours. BMET  Recent Labs  05/20/16 0243 05/20/16 1105  NA 128* 126*  K 4.2 3.8  CL 95* 93*  CO2 24 26  GLUCOSE 116* 137*  BUN 6 6  CREATININE 0.48 0.48  CALCIUM 8.7* 8.6*   PT/INR No results for input(s): LABPROT, INR in the last 72 hours. ABG No results for input(s): PHART, HCO3 in the last 72 hours.  Invalid input(s): PCO2, PO2  Studies/Results: No results found.  Anti-infectives: Anti-infectives    Start     Dose/Rate Route Frequency Ordered Stop   05/17/16 2000  ceFAZolin (ANCEF) IVPB 1 g/50 mL premix     1 g 100 mL/hr over 30 Minutes Intravenous Every 8 hours 05/17/16 1827        Assessment/Plan: Struck by tree branch TBI/tent SDH- exam improved, TBI team therapies, Dr. Franky Machoabbell following Nasal FX and impacted incisor- per Dr. Kenney Housemanrab, also to see dentist after D/C Nasal and facial lacs- S/P repair by Dr. Ronnald Nianrab Ancef- IV here per Dr. Kenney Housemanrab &Keflex PO at D/C for 1 week FEN- hypoNa improving.  recheckl BMP pending.  cont salt pills. Soft diet. Add protein shakes VTE- PAS Dipso- ICU, therapies  Mary SellaEric M. Andrey CampanileWilson, MD, FACS General, Bariatric, & Minimally Invasive Surgery [facility] [place]   LOS: 4 days    Atilano InaWILSON,Batul Diego M 05/21/2016

## 2016-05-22 LAB — BASIC METABOLIC PANEL
ANION GAP: 10 (ref 5–15)
ANION GAP: 14 (ref 5–15)
Anion gap: 10 (ref 5–15)
BUN: 11 mg/dL (ref 6–20)
BUN: 7 mg/dL (ref 6–20)
BUN: 6 mg/dL (ref 6–20)
CALCIUM: 9.2 mg/dL (ref 8.9–10.3)
CHLORIDE: 91 mmol/L — AB (ref 101–111)
CO2: 22 mmol/L (ref 22–32)
CO2: 24 mmol/L (ref 22–32)
CO2: 26 mmol/L (ref 22–32)
CREATININE: 0.43 mg/dL — AB (ref 0.44–1.00)
Calcium: 8.5 mg/dL — ABNORMAL LOW (ref 8.9–10.3)
Calcium: 8.8 mg/dL — ABNORMAL LOW (ref 8.9–10.3)
Chloride: 87 mmol/L — ABNORMAL LOW (ref 101–111)
Chloride: 79 mmol/L — ABNORMAL LOW (ref 101–111)
Creatinine, Ser: 0.41 mg/dL — ABNORMAL LOW (ref 0.44–1.00)
Creatinine, Ser: 0.6 mg/dL (ref 0.44–1.00)
GFR calc Af Amer: >60 mL/min (ref >60)
GFR calc Af Amer: >60 mL/min (ref >60)
GFR calc Af Amer: >60 mL/min (ref >60)
GFR calc non Af Amer: >60 mL/min (ref >60)
GFR calc non Af Amer: >60 mL/min (ref >60)
GLUCOSE: 102 mg/dL — AB (ref 65–99)
GLUCOSE: 114 mg/dL — AB (ref 65–99)
GLUCOSE: 125 mg/dL — AB (ref 65–99)
POTASSIUM: 4 mmol/L (ref 3.5–5.1)
POTASSIUM: 3.7 mmol/L (ref 3.5–5.1)
Potassium: 4.5 mmol/L (ref 3.5–5.1)
SODIUM: 121 mmol/L — AB (ref 135–145)
SODIUM: 119 mmol/L — AB (ref 135–145)
Sodium: 123 mmol/L — ABNORMAL LOW (ref 135–145)

## 2016-05-22 LAB — OSMOLALITY, URINE: OSMOLALITY UR: 395 mosm/kg (ref 300–900)

## 2016-05-22 LAB — SODIUM
SODIUM: 118 mmol/L — AB (ref 135–145)
Sodium: 119 mmol/L — CL (ref 135–145)

## 2016-05-22 LAB — OSMOLALITY: Osmolality: 247 mOsm/kg — CL (ref 275–295)

## 2016-05-22 LAB — SODIUM, URINE, RANDOM: Sodium, Ur: 101 mmol/L

## 2016-05-22 MED ORDER — PROMETHAZINE HCL 25 MG/ML IJ SOLN
12.5000 mg | Freq: Once | INTRAMUSCULAR | Status: AC
  Administered 2016-05-22: 12.5 mg via INTRAVENOUS
  Filled 2016-05-22: qty 1

## 2016-05-22 MED ORDER — FUROSEMIDE 40 MG PO TABS
40.0000 mg | ORAL_TABLET | Freq: Every day | ORAL | Status: DC
  Administered 2016-05-22: 40 mg via ORAL
  Filled 2016-05-22: qty 1

## 2016-05-22 MED ORDER — FUROSEMIDE 10 MG/ML IJ SOLN
40.0000 mg | Freq: Once | INTRAMUSCULAR | Status: AC
  Administered 2016-05-22: 40 mg via INTRAVENOUS
  Filled 2016-05-22: qty 4

## 2016-05-22 MED ORDER — SODIUM CHLORIDE 3 % IV SOLN
INTRAVENOUS | Status: DC
  Administered 2016-05-22 – 2016-05-23 (×2): 50 mL/h via INTRAVENOUS
  Filled 2016-05-22 (×4): qty 500

## 2016-05-22 NOTE — Progress Notes (Signed)
OT Cancellation Note  Patient Details Name: [name] MRN: [id number] DOB: May 13, 1955   Cancelled Treatment:    Reason Eval/Treat Not Completed: Fatigue/lethargy limiting ability to participate Attempted again this pm. Pt sleeping and family requesting therapy not see pt this pm. Will follow up tomorrow.  [facility]ARY Shawntel Farnworth, OT/L  [phone] 05/22/2016 05/22/2016, 3:25 PM

## 2016-05-22 NOTE — Progress Notes (Signed)
Patient nauseous and unable to keep sodium tablets down. MD made aware.

## 2016-05-22 NOTE — Progress Notes (Signed)
OT Cancellation Note  Patient Details Name: Annette Kidd MRN: [id number] DOB: 1956-01-15   Cancelled Treatment:    Reason Eval/Treat Not Completed: Other (comment). Pt not feeling well. Will attempt later this pm.  [facility]  Kymiah Araiza, OTR/L  [phone] 05/22/2016 05/22/2016, 2:16 PM

## 2016-05-22 NOTE — Progress Notes (Signed)
Patient's daughter notified RN of patient vomiting x1. Large amount of emesis noted. Patient had rec'd zofran at 0819 this morning and stated it provided very little relief. Casimiro NeedleMichael, PA notified and new orders rec'd for phenergan.   Leanna BattlesEckelmann, Shakayla Hickox Eileen, RN.

## 2016-05-22 NOTE — Progress Notes (Signed)
PT Cancellation Note  Patient Details Name: Annette Kidd MRN: [id number] DOB: 1955/06/01   Cancelled Treatment:    Reason Eval/Treat Not Completed: Fatigue/lethargy limiting ability to participate   Fabio AsaDevon J Itzamara Casas 05/22/2016, 3:25 PM [name], PT DPT [phone]

## 2016-05-22 NOTE — Progress Notes (Signed)
Patient ID: Annette Kidd, female   DOB: 10/21/1955, 61 y.o.   MRN: [id number]   LOS: 5 days   Subjective: Dizzy and nauseated this morning. Pain only marginally controlled with meds.   Objective: Vital signs in last 24 hours: Temp:  [97.4 F (36.3 C)-99.2 F (37.3 C)] 97.4 F (36.3 C) (01/15 0747) Pulse Rate:  [66-81] 72 (01/15 0747) Resp:  [12-18] 17 (01/15 0747) BP: (105-141)/(69-83) 137/83 (01/15 0747) SpO2:  [98 %-100 %] 99 % (01/15 0747) Last BM Date: 05/21/16   Laboratory  BMET  Recent Labs  05/21/16 1139 05/22/16 0300  NA 125* 123*  K 4.1 4.5  CL 89* 91*  CO2 27 22  GLUCOSE 112* 102*  BUN 7 11  CREATININE 0.47 0.43*  CALCIUM 8.8* 8.5*    Physical Exam General appearance: alert and slowed mentation Resp: clear to auscultation bilaterally Cardio: regular rate and rhythm GI: normal findings: bowel sounds normal and soft, non-tender Pulses: 2+ and symmetric   Assessment/Plan: Struck by tree branch TBI/tent SDH- exam improved, TBI team therapies, Dr. Franky Machoabbell following Nasal FX and impacted incisor- per Dr. Kenney Housemanrab, also to see dentist after D/C. IV Ancef here &Keflex PO at D/C for 1 week Nasal and facial lacs- S/P repair by Dr. Kenney Housemanrab Hyponatremia -- Will give Lasix FEN- No issues VTE- PAS Dipso- Home with HH once Na+ stable    Freeman CaldronMichael J. Naveyah Iacovelli, PA-C Pager: [phone] General Trauma [place] Pager: [phone]  05/22/2016

## 2016-05-22 NOTE — Progress Notes (Signed)
CRITICAL VALUE ALERT  Critical value received:  Na- 119  Date of notification: 05/22/16   Time of notification:  1930  Critical value read back: yes  Nurse who received alert:  Kizzie BaneAmy Charizma Gardiner, RN  MD notified (1st page):  [place]  Time of first page:  [phone]  Responding MD: Doylene Canardonner  Time MD responded:  [phone]

## 2016-05-22 NOTE — Progress Notes (Signed)
CRITICAL VALUE ALERT  Critical value received:  Na  Date of notification:  05/12/16   Time of notification:  2311  Critical value read back: yes  Nurse who received alert:  Kizzie BaneAmy Leaann Nevils, RN  MD notified (1st page): Fredricka Bonineonnor  Time of first page: 2312  Responding MD:  Fredricka Bonineonnor  Time MD responded:  2312

## 2016-05-23 ENCOUNTER — Inpatient Hospital Stay (HOSPITAL_COMMUNITY): Payer: Managed Care, Other (non HMO)

## 2016-05-23 LAB — BASIC METABOLIC PANEL
Anion gap: 12 (ref 5–15)
Anion gap: 9 (ref 5–15)
Anion gap: 11 (ref 5–15)
BUN: 10 mg/dL (ref 6–20)
BUN: 11 mg/dL (ref 6–20)
BUN: 15 mg/dL (ref 6–20)
CHLORIDE: 83 mmol/L — AB (ref 101–111)
CHLORIDE: 97 mmol/L — AB (ref 101–111)
CHLORIDE: 101 mmol/L (ref 101–111)
CO2: 26 mmol/L (ref 22–32)
CO2: 24 mmol/L (ref 22–32)
CO2: 21 mmol/L — AB (ref 22–32)
CREATININE: 0.49 mg/dL (ref 0.44–1.00)
CREATININE: 0.72 mg/dL (ref 0.44–1.00)
Calcium: 8.8 mg/dL — ABNORMAL LOW (ref 8.9–10.3)
Calcium: 8.7 mg/dL — ABNORMAL LOW (ref 8.9–10.3)
Calcium: 8.8 mg/dL — ABNORMAL LOW (ref 8.9–10.3)
Creatinine, Ser: 0.51 mg/dL (ref 0.44–1.00)
GFR calc Af Amer: >60 mL/min (ref >60)
GFR calc Af Amer: >60 mL/min (ref >60)
GFR calc non Af Amer: >60 mL/min (ref >60)
GFR calc non Af Amer: >60 mL/min (ref >60)
GFR calc non Af Amer: >60 mL/min (ref >60)
GLUCOSE: 122 mg/dL — AB (ref 65–99)
GLUCOSE: 133 mg/dL — AB (ref 65–99)
Glucose, Bld: 142 mg/dL — ABNORMAL HIGH (ref 65–99)
POTASSIUM: 3.4 mmol/L — AB (ref 3.5–5.1)
POTASSIUM: 3.3 mmol/L — AB (ref 3.5–5.1)
Potassium: 3.6 mmol/L (ref 3.5–5.1)
Sodium: 121 mmol/L — ABNORMAL LOW (ref 135–145)
Sodium: 130 mmol/L — ABNORMAL LOW (ref 135–145)
Sodium: 133 mmol/L — ABNORMAL LOW (ref 135–145)

## 2016-05-23 NOTE — Progress Notes (Signed)
Patient on 3% normal saline, rn discussed with pharmacy that per new order set patient has to be in the ICU.

## 2016-05-23 NOTE — Progress Notes (Signed)
Patient ID: Annette Kidd, female   DOB: [birth year], 61 y.o.   MRN: [id number]   LOS: 6 days   Subjective: Feeling better this morning, no dizziness or nausea currently. Responding with minimal to no delay as well. Has HA.   Objective: Vital signs in last 24 hours: Temp:  [97.4 F (36.3 C)-98.6 F (37 C)] 98.3 F (36.8 C) (01/16 0329) Pulse Rate:  [70-82] 72 (01/16 0329) Resp:  [13-17] 13 (01/16 0329) BP: (119-137)/(70-86) 119/80 (01/16 0329) SpO2:  [98 %-99 %] 98 % (01/16 0329) Last BM Date: 05/21/16   Laboratory  BMET  Recent Labs  05/22/16 1838  05/22/16 2113 05/23/16 0107  NA 119*  < > 119* 121*  K 3.7  --   --  3.4*  CL 79*  --   --  83*  CO2 26  --   --  26  GLUCOSE 125*  --   --  122*  BUN 6  --   --  10  CREATININE 0.60  --   --  0.49  CALCIUM 9.2  --   --  8.8*  < > = values in this interval not displayed.   Physical Exam General appearance: alert and no distress Resp: clear to auscultation bilaterally Cardio: regular rate and rhythm GI: normal findings: bowel sounds normal and soft, non-tender Pulses: 2+ and symmetric   Assessment/Plan: Struck by tree branch TBI/tent SDH- exam improved, TBI team therapies, Dr. Franky Machoabbell following. Repeat HCT pending. Nasal FX and impacted incisor- per Dr. Kenney Housemanrab, also to see dentist after D/C. IV Ancef here &Keflex PO at D/C for 1 week Nasal and facial lacs- S/P repair by Dr. Kenney Housemanrab Hyponatremia 2/2 SIADH -- Now on 3% with slight improvement, consult renal. Could consider fludrocortisone but will defer to renal. FEN- No issues VTE- PAS Dipso- Home with HH once Na+ stable    Freeman CaldronMichael J. Cain Fitzhenry, PA-C Pager: [phone] General Trauma [place] Pager: [phone]  05/23/2016

## 2016-05-23 NOTE — Progress Notes (Signed)
Patient transferred to [id number] in bed by Rn.

## 2016-05-23 NOTE — Consult Note (Signed)
[facility] Consult Note     Date: 05/23/2016                  Patient Name:  Annette Kidd  MRN: [id number]  DOB: 1956/02/05  Age / Sex: 61 y.o., female         PCP: No primary care provider on file.                 Service Requesting Consult: Trauma                 Reason for Consult: hyponatremia            Chief Complaint: traumatic facial injury  HPI: Pt is a 50F with no significant PMH who is now seen in consultation at the request of Dr. Lavone Neri of the trauma service for evaluation and recommendations surrounding hyponatremia.    Pt had a traumatic facial injury in which a tree branch hit her in the face.  She sustained some facial fractures.  Her serum sodium was 135 on admission and quickly fell to the low 120s and high 110s.  She was placed on salt tabs with no effect.  Then was 3% NaCl was added with some improvement and was stopped once serum sodium was 128.  3% Na was stopped for approximately 48 hours with serum sodium falling into the high 110s again.    Serum osms 247, urine osms 395, urine sodium 101 consistent with SIADH.    She has not been on a fluid restriction until yesterday evening and reported she was trying to drink lots of water.   History reviewed. No pertinent past medical history.  Past Surgical History:  Procedure Laterality Date  . APPENDECTOMY      No family history on file. Social History:  reports that she has never smoked. She has never used smokeless tobacco. She reports that she does not drink alcohol or use drugs.  Allergies: No Known Allergies  Medications Prior to Admission  Medication Sig Dispense Refill  . calcium carbonate (OS-CAL - DOSED IN MG OF ELEMENTAL CALCIUM) 1250 (500 Ca) MG tablet Take 1 tablet by mouth daily with breakfast.    . cholecalciferol (VITAMIN D) 1000 units tablet Take 1,000 Units by mouth daily.      Results for orders placed or performed during the hospital encounter of 05/17/16 (from the past  48 hour(s))  Basic metabolic panel     Status: Abnormal   Collection Time: 05/21/16 11:39 AM  Result Value Ref Range   Sodium 125 (L) 135 - 145 mmol/L   Potassium 4.1 3.5 - 5.1 mmol/L   Chloride 89 (L) 101 - 111 mmol/L   CO2 27 22 - 32 mmol/L   Glucose, Bld 112 (H) 65 - 99 mg/dL   BUN 7 6 - 20 mg/dL   Creatinine, Ser 0.47 0.44 - 1.00 mg/dL   Calcium 8.8 (L) 8.9 - 10.3 mg/dL   GFR calc non Af Amer >60 >60 mL/min   GFR calc Af Amer >60 >60 mL/min    Comment: (NOTE) The eGFR has been calculated using the CKD EPI equation. This calculation has not been validated in all clinical situations. eGFR's persistently <60 mL/min signify possible Chronic Kidney Disease.    Anion gap 9 5 - 15  Basic metabolic panel     Status: Abnormal   Collection Time: 05/22/16  3:00 AM  Result Value Ref Range   Sodium 123 (L) 135 - 145 mmol/L  Potassium 4.5 3.5 - 5.1 mmol/L   Chloride 91 (L) 101 - 111 mmol/L   CO2 22 22 - 32 mmol/L   Glucose, Bld 102 (H) 65 - 99 mg/dL   BUN 11 6 - 20 mg/dL   Creatinine, Ser 0.43 (L) 0.44 - 1.00 mg/dL   Calcium 8.5 (L) 8.9 - 10.3 mg/dL   GFR calc non Af Amer >60 >60 mL/min   GFR calc Af Amer >60 >60 mL/min    Comment: (NOTE) The eGFR has been calculated using the CKD EPI equation. This calculation has not been validated in all clinical situations. eGFR's persistently <60 mL/min signify possible Chronic Kidney Disease.    Anion gap 10 5 - 15  Basic metabolic panel     Status: Abnormal   Collection Time: 05/22/16 10:24 AM  Result Value Ref Range   Sodium 121 (L) 135 - 145 mmol/L   Potassium 4.0 3.5 - 5.1 mmol/L   Chloride 87 (L) 101 - 111 mmol/L   CO2 24 22 - 32 mmol/L   Glucose, Bld 114 (H) 65 - 99 mg/dL   BUN 7 6 - 20 mg/dL   Creatinine, Ser 0.41 (L) 0.44 - 1.00 mg/dL   Calcium 8.8 (L) 8.9 - 10.3 mg/dL   GFR calc non Af Amer >60 >60 mL/min   GFR calc Af Amer >60 >60 mL/min    Comment: (NOTE) The eGFR has been calculated using the CKD EPI equation. This  calculation has not been validated in all clinical situations. eGFR's persistently <60 mL/min signify possible Chronic Kidney Disease.    Anion gap 10 5 - 15  Basic metabolic panel     Status: Abnormal   Collection Time: 05/22/16  6:38 PM  Result Value Ref Range   Sodium 119 (LL) 135 - 145 mmol/L    Comment: CRITICAL RESULT CALLED TO, READ BACK BY AND VERIFIED WITH: ADonavan Foil RN [phone] 1926 GREEN R    Potassium 3.7 3.5 - 5.1 mmol/L   Chloride 79 (L) 101 - 111 mmol/L   CO2 26 22 - 32 mmol/L   Glucose, Bld 125 (H) 65 - 99 mg/dL   BUN 6 6 - 20 mg/dL   Creatinine, Ser 0.60 0.44 - 1.00 mg/dL   Calcium 9.2 8.9 - 10.3 mg/dL   GFR calc non Af Amer >60 >60 mL/min   GFR calc Af Amer >60 >60 mL/min    Comment: (NOTE) The eGFR has been calculated using the CKD EPI equation. This calculation has not been validated in all clinical situations. eGFR's persistently <60 mL/min signify possible Chronic Kidney Disease.    Anion gap 14 5 - 15  Sodium     Status: Abnormal   Collection Time: 05/22/16  7:51 PM  Result Value Ref Range   Sodium 118 (LL) 135 - 145 mmol/L    Comment: CRITICAL RESULT CALLED TO, READ BACK BY AND VERIFIED WITHEsperanza Sheets RN [phone] GREEN R   Osmolality     Status: Abnormal   Collection Time: 05/22/16  7:51 PM  Result Value Ref Range   Osmolality 247 (LL) 275 - 295 mOsm/kg    Comment: REPEATED TO VERIFY CRITICAL RESULT CALLED TO, READ BACK BY AND VERIFIED WITH: A KORSLIEN,RN 2048 05/22/16 D BRADLEY   Sodium     Status: Abnormal   Collection Time: 05/22/16  9:13 PM  Result Value Ref Range   Sodium 119 (LL) 135 - 145 mmol/L    Comment: CRITICAL RESULT CALLED TO, READ BACK  BY AND VERIFIED WITH: KORSLEIN,A RN 05/22/2016 2311 JORDANS   Osmolality, urine     Status: None   Collection Time: 05/22/16  9:32 PM  Result Value Ref Range   Osmolality, Ur 395 300 - 900 mOsm/kg  Sodium, urine, random     Status: None   Collection Time: 05/22/16  9:32 PM  Result Value  Ref Range   Sodium, Ur 101 mmol/L  Basic metabolic panel     Status: Abnormal   Collection Time: 05/23/16  1:07 AM  Result Value Ref Range   Sodium 121 (L) 135 - 145 mmol/L   Potassium 3.4 (L) 3.5 - 5.1 mmol/L   Chloride 83 (L) 101 - 111 mmol/L   CO2 26 22 - 32 mmol/L   Glucose, Bld 122 (H) 65 - 99 mg/dL   BUN 10 6 - 20 mg/dL   Creatinine, Ser 0.49 0.44 - 1.00 mg/dL   Calcium 8.8 (L) 8.9 - 10.3 mg/dL   GFR calc non Af Amer >60 >60 mL/min   GFR calc Af Amer >60 >60 mL/min    Comment: (NOTE) The eGFR has been calculated using the CKD EPI equation. This calculation has not been validated in all clinical situations. eGFR's persistently <60 mL/min signify possible Chronic Kidney Disease.    Anion gap 12 5 - 15   Ct Head Wo Contrast  Result Date: 05/23/2016 CLINICAL DATA:  Followup traumatic subdural hematoma. Traumatic brain injury. EXAM: CT HEAD WITHOUT CONTRAST TECHNIQUE: Contiguous axial images were obtained from the base of the skull through the vertex without intravenous contrast. COMPARISON:  05/17/2016 FINDINGS: Brain: Decreased size of small subdural hematoma seen along the left tentorium since previous study. No other sites of intracranial hemorrhage identified. No evidence of intraparenchymal hemorrhage or contusion. No evidence of acute infarction, hydrocephalus, or mass lesion/mass effect. Vascular: No hyperdense vessel or unexpected calcification. Skull: No skull fracture identified. Bilateral nasal bone fractures and fracture of the nasal septum again noted. Sinuses/Orbits: No acute finding. Other: None. IMPRESSION: Decreased size of small left tentorial subdural hematoma. No new intracranial abnormality identified. Bilateral nasal bone and nasal septal fractures again noted. Electronically Signed   By: Earle Gell M.D.   On: 05/23/2016 08:58    ROS: all other systems reviewed and are negative except as per HPI  Blood pressure 122/69, pulse 74, temperature 97.5 F (36.4 C),  temperature source Oral, resp. rate 15, height _0  (1.676 m), weight 62.8 kg (138 lb 7.2 oz), SpO2 98 %. Physical Exam  GEN: NAD sitting in bed HEENT: + scabbing and bruising of nose NECK some mild JVD PULM CTAB no c/w/r CV RRR no m/r/g ABD soft nontender nondistended EXT no LE edema NEURO responds appropriately to questions, nonfocal  Assessment/Plan  1.  Hyponatremia: consistent with SIADH.  Fluid restriction explained to pt and family; may be part of the reason why serum sodium fell after 3% NaCl stopped the first time.  Check q4 serum sodiums, targeting no more than 6-8 mmol/ L increase in serum sodium over 24 hours.  Would decrease 3% NaCl as appropriate.  Ideally would like to take off 3% first and then taper salt tabs if able.  Robust urine output with Lasix- would hold today given potential for serum sodium to increase rapidly.    2.  Facial fractures: likely source of SIADH.  Expect this to be temporary.    Madelon Lips, MD 05/23/2016, 10:16 AM

## 2016-05-23 NOTE — Progress Notes (Signed)
OT Cancellation Note  Patient Details Name: Annette Kidd MRN: [id number] DOB: Feb 04, 1956   Cancelled Treatment:    Reason Eval/Treat Not Completed: Medical issues which prohibited therapy (Pt with hyponatremia. Transferred to ICU.)  Evern BioMayberry, Saheed Carrington Lynn 05/23/2016, 12:28 PM

## 2016-05-23 NOTE — Progress Notes (Signed)
Physical Therapy Treatment Patient Details Name: Annette Kidd MRN: [id number] DOB: 08-Jan-1956 Today's Date: 05/23/2016    History of Present Illness Patient is a 61 y/o female with no PMH was hit by falling branch while walking her dog. + LOC. GCS of 12 and admitted as level 1 trauma. Found to have a small supratentorial SDH with concussive syndrome of amnesia. She also has a nasal bone fracture and maxilla fracture    PT Comments    Pt making good progress with mobility.   Follow Up Recommendations  Home health PT;Supervision for mobility/OOB;Supervision/Assistance - 24 hour     Equipment Recommendations  None recommended by PT    Recommendations for Other Services       Precautions / Restrictions Precautions Precautions: Fall Precaution Comments: mild dizziness when standing  Restrictions Weight Bearing Restrictions: No    Mobility  Bed Mobility Overal bed mobility: Modified Independent Bed Mobility: Supine to Sit;Sit to Supine     Supine to sit: Modified independent (Device/Increase time) Sit to supine: Modified independent (Device/Increase time)   General bed mobility comments: Incr time  Transfers Overall transfer level: Needs assistance Equipment used: None Transfers: Sit to/from Stand Sit to Stand: Min guard         General transfer comment: for safety  Ambulation/Gait Ambulation/Gait assistance: Min assist;Min guard Ambulation Distance (Feet): 300 Feet Assistive device: 1 person hand held assist;None Gait Pattern/deviations: Step-through pattern;Decreased stride length;Drifts right/left Gait velocity: decr Gait velocity interpretation: Below normal speed for age/gender General Gait Details: Pt slightly unsteady but no loss of balance. Initially HHA and then progressed to no assistive device.   Stairs            Wheelchair Mobility    Modified Rankin (Stroke Patients Only)       Balance Overall balance assessment: Needs  assistance Sitting-balance support: Feet supported Sitting balance-Leahy Scale: Good     Standing balance support: During functional activity Standing balance-Leahy Scale: Fair                      Cognition Arousal/Alertness: Awake/alert Behavior During Therapy: Flat affect Overall Cognitive Status: Impaired/Different from baseline                 General Comments: Pt with slightly slowed responses and reports feeling "fuzzy".     Exercises      General Comments        Pertinent Vitals/Pain      Home Living                      Prior Function            PT Goals (current goals can now be found in the care plan section) Progress towards PT goals: Progressing toward goals    Frequency    Min 3X/week      PT Plan Current plan remains appropriate    Co-evaluation             End of Session   Activity Tolerance: Patient tolerated treatment well Patient left: in bed;with call bell/phone within reach     Time: 1330-1346 PT Time Calculation (min) (ACUTE ONLY): 16 min  Charges:  $Gait Training: 8-22 mins                    G CodesAngelina Ok:      Brunetta Newingham W Maycok 05/23/2016, 2:22 PM Fluor CorporationCary Toma Erichsen PT [phone]

## 2016-05-23 NOTE — Progress Notes (Signed)
Patient ID: Annette Kidd, female   DOB: March 19, 1956, 61 y.o.   MRN: [id number] BP 126/64   Pulse 86   Temp 98.3 F (36.8 C) (Oral)   Resp (!) 21   Ht 5\' 6"  (1.676 m)   Wt 62.8 kg (138 lb 7.2 oz)   SpO2 98%   BMI 22.35 kg/m  Alert, oriented x 4, speech is clear and fluent perrl full eom Symmetric facies

## 2016-05-24 LAB — BASIC METABOLIC PANEL
ANION GAP: 9 (ref 5–15)
ANION GAP: 9 (ref 5–15)
ANION GAP: 11 (ref 5–15)
Anion gap: 10 (ref 5–15)
BUN: 14 mg/dL (ref 6–20)
BUN: 9 mg/dL (ref 6–20)
BUN: 7 mg/dL (ref 6–20)
BUN: 8 mg/dL (ref 6–20)
CALCIUM: 8.5 mg/dL — AB (ref 8.9–10.3)
CALCIUM: 8.8 mg/dL — AB (ref 8.9–10.3)
CALCIUM: 8.9 mg/dL (ref 8.9–10.3)
CHLORIDE: 92 mmol/L — AB (ref 101–111)
CO2: 23 mmol/L (ref 22–32)
CO2: 23 mmol/L (ref 22–32)
CO2: 27 mmol/L (ref 22–32)
CO2: 23 mmol/L (ref 22–32)
Calcium: 9 mg/dL (ref 8.9–10.3)
Chloride: 99 mmol/L — ABNORMAL LOW (ref 101–111)
Chloride: 96 mmol/L — ABNORMAL LOW (ref 101–111)
Chloride: 91 mmol/L — ABNORMAL LOW (ref 101–111)
Creatinine, Ser: 0.43 mg/dL — ABNORMAL LOW (ref 0.44–1.00)
Creatinine, Ser: 0.38 mg/dL — ABNORMAL LOW (ref 0.44–1.00)
Creatinine, Ser: 0.33 mg/dL — ABNORMAL LOW (ref 0.44–1.00)
Creatinine, Ser: 0.34 mg/dL — ABNORMAL LOW (ref 0.44–1.00)
GFR calc Af Amer: >60 mL/min (ref >60)
GFR calc non Af Amer: >60 mL/min (ref >60)
GLUCOSE: 118 mg/dL — AB (ref 65–99)
GLUCOSE: 150 mg/dL — AB (ref 65–99)
Glucose, Bld: 113 mg/dL — ABNORMAL HIGH (ref 65–99)
Glucose, Bld: 114 mg/dL — ABNORMAL HIGH (ref 65–99)
Potassium: 3.8 mmol/L (ref 3.5–5.1)
Potassium: 3.6 mmol/L (ref 3.5–5.1)
Potassium: 3.6 mmol/L (ref 3.5–5.1)
Potassium: 3.4 mmol/L — ABNORMAL LOW (ref 3.5–5.1)
Sodium: 131 mmol/L — ABNORMAL LOW (ref 135–145)
Sodium: 129 mmol/L — ABNORMAL LOW (ref 135–145)
Sodium: 128 mmol/L — ABNORMAL LOW (ref 135–145)
Sodium: 125 mmol/L — ABNORMAL LOW (ref 135–145)

## 2016-05-24 LAB — SODIUM
SODIUM: 129 mmol/L — AB (ref 135–145)
SODIUM: 128 mmol/L — AB (ref 135–145)
Sodium: 131 mmol/L — ABNORMAL LOW (ref 135–145)
Sodium: 127 mmol/L — ABNORMAL LOW (ref 135–145)

## 2016-05-24 MED ORDER — DEXTROSE 5 % IV SOLN
INTRAVENOUS | Status: DC
  Administered 2016-05-24: 08:00:00 via INTRAVENOUS

## 2016-05-24 MED ORDER — ONDANSETRON HCL 4 MG PO TABS
4.0000 mg | ORAL_TABLET | ORAL | Status: DC | PRN
  Administered 2016-05-26: 4 mg via ORAL
  Filled 2016-05-24: qty 1

## 2016-05-24 MED ORDER — ONDANSETRON HCL 4 MG/2ML IJ SOLN
4.0000 mg | INTRAMUSCULAR | Status: DC | PRN
  Administered 2016-05-24 – 2016-05-25 (×2): 4 mg via INTRAVENOUS
  Filled 2016-05-24 (×3): qty 2

## 2016-05-24 NOTE — Progress Notes (Signed)
[facility] [place] and current plan of care, per her request.    Quintella BatonJulie W. Zeva Leber, RN, BSN  Trauma/Neuro ICU Case Manager [phone]

## 2016-05-24 NOTE — Progress Notes (Signed)
Patient ID: Annette Kidd, female   DOB: 15-Aug-1955, 61 y.o.   MRN: [id number] BP 124/72   Pulse 66   Temp 97.4 F (36.3 C) (Oral)   Resp (!) 9   Ht 5\' 6"  (1.676 m)   Wt 62.8 kg (138 lb 7.2 oz)   SpO2 98%   BMI 22.35 kg/m  Alert and oriented x 4, speech is clear and fluent Moving all extremities well Perrl, full eom Na at 128 most recently

## 2016-05-24 NOTE — Progress Notes (Signed)
Patient ID: Annette Kidd, female   DOB: [birth year], 61 y.o.   MRN: [id number]  [facility] Surgery Progress Note     Subjective: Did not sleep very well last night. Complaining of nausea and increased headache today.  Objective: Vital signs in last 24 hours: Temp:  [97.4 F (36.3 C)-98.6 F (37 C)] 97.4 F (36.3 C) (01/17 0800) Pulse Rate:  [65-95] 69 (01/17 1000) Resp:  [13-25] 25 (01/17 1000) BP: (93-134)/(60-92) 126/92 (01/17 1000) SpO2:  [95 %-100 %] 99 % (01/17 1000) Last BM Date: 05/23/16  Intake/Output from previous day: 01/16 0701 - 01/17 0700 In: 2101.7 [P.O.:1100; I.V.:401.7; IV Piggyback:600] Out: 750 [Urine:750] Intake/Output this shift: Total I/O In: 316.3 [P.O.:145; I.V.:171.3] Out: -   PE: Physical Exam General appearance: alert and no distress Resp: clear to auscultation bilaterally, effort normal Cardio: regular rate and rhythm GI: normal findings: soft, ND/NT, +BS Pulses: 2+ and symmetric   Lab Results:  No results for input(s): WBC, HGB, HCT, PLT in the last 72 hours. BMET  Recent Labs  05/23/16 1812 05/24/16 0147 05/24/16 0532  NA 133* 131* 131*  K 3.6 3.8  --   CL 101 99*  --   CO2 21* 23  --   GLUCOSE 142* 113*  --   BUN 15 14  --   CREATININE 0.72 0.43*  --   CALCIUM 8.8* 8.5*  --    PT/INR No results for input(s): LABPROT, INR in the last 72 hours. CMP     Component Value Date/Time   NA 131 (L) 05/24/2016 0532   K 3.8 05/24/2016 0147   CL 99 (L) 05/24/2016 0147   CO2 23 05/24/2016 0147   GLUCOSE 113 (H) 05/24/2016 0147   BUN 14 05/24/2016 0147   CREATININE 0.43 (L) 05/24/2016 0147   CALCIUM 8.5 (L) 05/24/2016 0147   PROT 6.6 05/17/2016 1000   ALBUMIN 4.1 05/17/2016 1000   AST 28 05/17/2016 1000   ALT 20 05/17/2016 1000   ALKPHOS 64 05/17/2016 1000   BILITOT 0.7 05/17/2016 1000   GFRNONAA >60 05/24/2016 0147   GFRAA >60 05/24/2016 0147   Lipase  No results found for: LIPASE     Studies/Results: Ct Head Wo  Contrast  Result Date: 05/23/2016 CLINICAL DATA:  Followup traumatic subdural hematoma. Traumatic brain injury. EXAM: CT HEAD WITHOUT CONTRAST TECHNIQUE: Contiguous axial images were obtained from the base of the skull through the vertex without intravenous contrast. COMPARISON:  05/17/2016 FINDINGS: Brain: Decreased size of small subdural hematoma seen along the left tentorium since previous study. No other sites of intracranial hemorrhage identified. No evidence of intraparenchymal hemorrhage or contusion. No evidence of acute infarction, hydrocephalus, or mass lesion/mass effect. Vascular: No hyperdense vessel or unexpected calcification. Skull: No skull fracture identified. Bilateral nasal bone fractures and fracture of the nasal septum again noted. Sinuses/Orbits: No acute finding. Other: None. IMPRESSION: Decreased size of small left tentorial subdural hematoma. No new intracranial abnormality identified. Bilateral nasal bone and nasal septal fractures again noted. Electronically Signed   By: Myles Rosenthal M.D.   On: 05/23/2016 08:58    Anti-infectives: Anti-infectives    Start     Dose/Rate Route Frequency Ordered Stop   05/17/16 2000  ceFAZolin (ANCEF) IVPB 1 g/50 mL premix     1 g 100 mL/hr over 30 Minutes Intravenous Every 8 hours 05/17/16 1827         Assessment/Plan Struck by tree branch TBI/tent SDH- CT 1/16 subdural hematoma improving. Exam improved, TBI team  therapies, Dr. Franky Machoabbell following Nasal FX and impacted incisor- per Dr. Kenney Housemanrab, also to see dentist after D/C. IV Ancef here &Keflex PO at D/C for 1 week Nasal and facial lacs- S/P repair by Dr. Kenney Housemanrab Hyponatremia 2/2 SIADH-- S/p 3% yesterday, Na improved at 131 this morning. Appreciate renal input. On oral fluid restriction. Continue q2 serum sodiums. Resumption of salt tabs per renal.  ID - ancef 1/10>> FEN- dysphagia 3 VTE- SCDs  Dipso- Home with HH once Na+ stable   LOS: 7 days    Edson SnowballBROOKE A MILLER ,  [facility] 05/24/2016, 10:07 AM Pager: [phone] Consults: [phone] Mon-Fri 7:00 am-4:30 pm Sat-Sun 7:00 am-11:30 am

## 2016-05-24 NOTE — Progress Notes (Signed)
~  0115: Lab on floor. Discussed Na and BMP orders and times. Realized that Lab is unable to see Na orders. Worked with Lab to establish a way for them to see Q4 Na checks and Q8 BMP. Successful. Will pass along to dayshift and continue to monitor.

## 2016-05-24 NOTE — Progress Notes (Signed)
Physical Therapy Treatment Patient Details Name: Mahum Betten MRN: [id number] DOB: 08-10-1955 Today's Date: 05/24/2016    History of Present Illness Patient is a 61 y/o female with no PMH was hit by falling branch while walking her dog. + LOC. GCS of 12 and admitted as level 1 trauma. Found to have a small supratentorial SDH with concussive syndrome of amnesia. She also has a nasal bone fracture and maxilla fracture    PT Comments    Pt with nausea limiting mobility. Pt needs encouragement to mobilize because she feels so bad due to nausea. Pt denies dizziness but has a hard time describing how she feels.  Follow Up Recommendations  Home health PT;Supervision for mobility/OOB;Supervision/Assistance - 24 hour     Equipment Recommendations  None recommended by PT    Recommendations for Other Services       Precautions / Restrictions Precautions Precautions: Fall Precaution Comments: mild dizziness when standing  Restrictions Weight Bearing Restrictions: No    Mobility  Bed Mobility Overal bed mobility: Modified Independent Bed Mobility: Supine to Sit;Sit to Supine     Supine to sit: Min assist Sit to supine: Modified independent (Device/Increase time)   General bed mobility comments: Assist to get up to EOB due to nausea  Transfers Overall transfer level: Needs assistance Equipment used: None Transfers: Sit to/from Stand Sit to Stand: Min guard         General transfer comment: for safety  Ambulation/Gait Ambulation/Gait assistance: Min assist Ambulation Distance (Feet): 150 Feet Assistive device: 1 person hand held assist Gait Pattern/deviations: Step-through pattern;Decreased stride length;Drifts right/left Gait velocity: decr Gait velocity interpretation: Below normal speed for age/gender General Gait Details: Pt very guarded and c/o nausea throughout. Assist due to unsteadiness and pt's hesitancy due to nausea   Stairs            Wheelchair  Mobility    Modified Rankin (Stroke Patients Only)       Balance Overall balance assessment: Needs assistance Sitting-balance support: Feet supported Sitting balance-Leahy Scale: Good     Standing balance support: During functional activity Standing balance-Leahy Scale: Fair                      Cognition Arousal/Alertness: Awake/alert Behavior During Therapy: Flat affect Overall Cognitive Status: Impaired/Different from baseline                 General Comments: Pt with slightly slowed responses and reports feeling "fuzzy".     Exercises      General Comments        Pertinent Vitals/Pain Pain Assessment: Faces Faces Pain Scale: Hurts even more Pain Location: head and neck Pain Descriptors / Indicators: Grimacing;Guarding Pain Intervention(s): Limited activity within patient's tolerance;Monitored during session    Home Living                      Prior Function            PT Goals (current goals can now be found in the care plan section) Progress towards PT goals: Progressing toward goals    Frequency    Min 3X/week      PT Plan Current plan remains appropriate    Co-evaluation             End of Session   Activity Tolerance: Other (comment) (Limited by nausea) Patient left: in bed;with call bell/phone within reach;with family/visitor present     Time: 7829-5621 PT Time Calculation (  min) (ACUTE ONLY): 15 min  Charges:  $Gait Training: 8-22 mins                    G CodesAngelina Ok:      Jaimarie Rapozo W [place] 05/24/2016, 11:28 AM Skip Mayerary Glade Strausser PT [phone]

## 2016-05-24 NOTE — Progress Notes (Signed)
Per Dr Franky Machoabbell- call Dr. Kenney Housemanrab to let him know it has been a week since the facial sutures have been placed since pt will not be able to follow-up at surgery center per his previous consult note. Attempted to reach on-call DMD. Natalynn Pedone, [place] ScrapeSarah E, RN

## 2016-05-24 NOTE — Progress Notes (Signed)
[facility] Progress Note    Assessment/ Plan:   1.  Hyponatremia: consistent with SIADH.  - continue oral fluid restriction - serum sodium overcorrecting- hypertonic stopped yesterday, serum sodium 119-->130-->133-->131-->131.   - stopping salt tabs, D5W @ 75 mL/ hr started at 0745 this am-- do not see a role for DDVAP at this point as long as rate of correction is slowed with D5W alone - q 2 serum sodiums - overall plan today- slow rate of correction of serum sodium, close monitoring, fluid restriction, ? Resumption of salt tabs  2.  Facial fractures: likely source of SIADH.  Expect metabolic derangements to be temporary.   Subjective:    Transferred to ICU yesterday for hypertonic saline monitoring.  Serum sodium overcorrecting, hypertonic stopped.   Objective:   BP 122/70   Pulse 65   Temp 97.4 F (36.3 C) (Oral)   Resp 13   Ht 5\' 6"  (1.676 m)   Wt 62.8 kg (138 lb 7.2 oz)   SpO2 99%   BMI 22.35 kg/m   Intake/Output Summary (Last 24 hours) at 05/24/16 0955 Last data filed at 05/24/16 0900  Gross per 24 hour  Intake          2222.92 ml  Output              750 ml  Net          1472.92 ml   Weight change:   Physical Exam: GEN: NAD sitting in bed HEENT: + scabbing and bruising of nose NECK some mild JVD PULM CTAB no c/w/r CV RRR no m/r/g ABD soft nontender nondistended EXT no LE edema NEURO responds appropriately to questions, nonfocal AAO x 3  Imaging: Ct Head Wo Contrast  Result Date: 05/23/2016 CLINICAL DATA:  Followup traumatic subdural hematoma. Traumatic brain injury. EXAM: CT HEAD WITHOUT CONTRAST TECHNIQUE: Contiguous axial images were obtained from the base of the skull through the vertex without intravenous contrast. COMPARISON:  05/17/2016 FINDINGS: Brain: Decreased size of small subdural hematoma seen along the left tentorium since previous study. No other sites of intracranial hemorrhage identified. No evidence of intraparenchymal  hemorrhage or contusion. No evidence of acute infarction, hydrocephalus, or mass lesion/mass effect. Vascular: No hyperdense vessel or unexpected calcification. Skull: No skull fracture identified. Bilateral nasal bone fractures and fracture of the nasal septum again noted. Sinuses/Orbits: No acute finding. Other: None. IMPRESSION: Decreased size of small left tentorial subdural hematoma. No new intracranial abnormality identified. Bilateral nasal bone and nasal septal fractures again noted. Electronically Signed   By: Myles RosenthalJohn  Stahl M.D.   On: 05/23/2016 08:58    Labs: BMET  Recent Labs Lab 05/22/16 0300 05/22/16 1024 05/22/16 1838 05/22/16 1951 05/22/16 2113 05/23/16 0107 05/23/16 1053 05/23/16 1812 05/24/16 0147 05/24/16 0532  NA 123* 121* 119* 118* 119* 121* 130* 133* 131* 131*  K 4.5 4.0 3.7  --   --  3.4* 3.3* 3.6 3.8  --   CL 91* 87* 79*  --   --  83* 97* 101 99*  --   CO2 22 24 26   --   --  26 24 21* 23  --   GLUCOSE 102* 114* 125*  --   --  122* 133* 142* 113*  --   BUN 11 7 6   --   --  10 11 15 14   --   CREATININE 0.43* 0.41* 0.60  --   --  0.49 0.51 0.72 0.43*  --   CALCIUM 8.5* 8.8*  9.2  --   --  8.8* 8.7* 8.8* 8.5*  --    CBC  Recent Labs Lab 05/17/16 1000 05/18/16 0530  WBC 6.6 12.6*  NEUTROABS 3.1  --   HGB 13.3 11.6*  HCT 38.7 33.2*  MCV 92.6 90.2  PLT 278 219    Medications:    . bacitracin   Topical TID  . calcium carbonate  1 tablet Oral Q breakfast  .  ceFAZolin (ANCEF) IV  1 g Intravenous Q8H  . cholecalciferol  1,000 Units Oral Daily  . docusate sodium  100 mg Oral BID  . feeding supplement  1 Container Oral BID BM  . pantoprazole  40 mg Oral Daily  . polyethylene glycol  17 g Oral Daily  . sodium chloride flush  10 mL Intravenous Q12H  . sodium chloride flush  3 mL Intravenous Q12H      Bufford Buttner, MD 05/24/2016, 9:55 AM

## 2016-05-25 LAB — SODIUM
SODIUM: 126 mmol/L — AB (ref 135–145)
SODIUM: 123 mmol/L — AB (ref 135–145)
Sodium: 125 mmol/L — ABNORMAL LOW (ref 135–145)
Sodium: 124 mmol/L — ABNORMAL LOW (ref 135–145)

## 2016-05-25 LAB — BASIC METABOLIC PANEL
Anion gap: 8 (ref 5–15)
BUN: 10 mg/dL (ref 6–20)
CALCIUM: 8.8 mg/dL — AB (ref 8.9–10.3)
CHLORIDE: 92 mmol/L — AB (ref 101–111)
CO2: 25 mmol/L (ref 22–32)
Creatinine, Ser: 0.33 mg/dL — ABNORMAL LOW (ref 0.44–1.00)
Glucose, Bld: 106 mg/dL — ABNORMAL HIGH (ref 65–99)
Potassium: 3.8 mmol/L (ref 3.5–5.1)
Sodium: 125 mmol/L — ABNORMAL LOW (ref 135–145)

## 2016-05-25 MED ORDER — CEPHALEXIN 500 MG PO CAPS
500.0000 mg | ORAL_CAPSULE | Freq: Three times a day (TID) | ORAL | Status: DC
  Administered 2016-05-25 – 2016-05-29 (×13): 500 mg via ORAL
  Filled 2016-05-25 (×15): qty 1

## 2016-05-25 MED ORDER — FUROSEMIDE 20 MG PO TABS
20.0000 mg | ORAL_TABLET | Freq: Two times a day (BID) | ORAL | Status: DC
  Administered 2016-05-25 – 2016-05-26 (×2): 20 mg via ORAL
  Filled 2016-05-25 (×2): qty 1

## 2016-05-25 NOTE — Progress Notes (Signed)
[facility] Progress Note    Assessment/ Plan:   1.  Hyponatremia: consistent with SIADH.  - continue oral fluid restriction - can liberalized serum sodiums to q 4 hours - would not restart salt tabs as she does not have a total body sodium deficit and primary driver of hyponatremia is ADH - overall volume exam is improving- appears more euvolemic, would consider small dose of IV Lasix (20 mg) if sodium correction does not progress - ordering TSH and AM cortisol  2.  Facial fractures: likely source of SIADH.  Expect metabolic derangements to be temporary.   Subjective:    Still reports headache today   Objective:   BP 123/77   Pulse 64   Temp 98.1 F (36.7 C) (Oral)   Resp 10   Ht 5\' 6"  (1.676 m)   Wt 62.8 kg (138 lb 7.2 oz)   SpO2 99%   BMI 22.35 kg/m   Intake/Output Summary (Last 24 hours) at 05/25/16 0930 Last data filed at 05/25/16 0320  Gross per 24 hour  Intake              982 ml  Output                0 ml  Net              982 ml   Weight change:   Physical Exam: GEN: NAD sitting in bed HEENT: + scabbing and bruising of nose NECK no JVD PULM CTAB no c/w/r CV RRR no m/r/g ABD soft nontender nondistended EXT no LE edema NEURO responds appropriately to questions, nonfocal AAO x 3  Imaging: No results found.  Labs: BMET  Recent Labs Lab 05/23/16 1053 05/23/16 1812 05/24/16 0147  05/24/16 1051 05/24/16 1326 05/24/16 1613 05/24/16 1826 05/24/16 2238 05/25/16 0307 05/25/16 0710  NA 130* 133* 131*  < > 129* 128* 128* 125* 127* 125* 126*  K 3.3* 3.6 3.8  --  3.6  --  3.6 3.4*  --  3.8  --   CL 97* 101 99*  --  96*  --  92* 91*  --  92*  --   CO2 24 21* 23  --  23  --  27 23  --  25  --   GLUCOSE 133* 142* 113*  --  114*  --  118* 150*  --  106*  --   BUN 11 15 14   --  9  --  7 8  --  10  --   CREATININE 0.51 0.72 0.43*  --  0.38*  --  0.33* 0.34*  --  0.33*  --   CALCIUM 8.7* 8.8* 8.5*  --  8.8*  --  9.0 8.9  --  8.8*  --   <  > = values in this interval not displayed. CBC No results for input(s): WBC, NEUTROABS, HGB, HCT, MCV, PLT in the last 168 hours.  Medications:    . bacitracin   Topical TID  . calcium carbonate  1 tablet Oral Q breakfast  . cephALEXin  500 mg Oral Q8H  . cholecalciferol  1,000 Units Oral Daily  . docusate sodium  100 mg Oral BID  . feeding supplement  1 Container Oral BID BM  . pantoprazole  40 mg Oral Daily  . polyethylene glycol  17 g Oral Daily  . sodium chloride flush  10 mL Intravenous Q12H  . sodium chloride flush  3 mL Intravenous Q12H  Bufford ButtnerElizabeth Myalee Stengel, MD 05/25/2016, 9:30 AM

## 2016-05-25 NOTE — Progress Notes (Signed)
Latest sodium is trending down again 127-125-126--- now 123.  I will add some low dose lasix and continue to follow   Ada Holness A

## 2016-05-25 NOTE — Progress Notes (Addendum)
Physical Therapy Treatment Patient Details Name: Annette Kidd MRN: [id number] DOB: 1955-07-06 Today's Date: 05/25/2016    History of Present Illness Patient is a 61 y/o female with no PMH was hit by falling branch while walking her dog. + LOC. GCS of 12 and admitted as level 1 trauma. Found to have a small supratentorial SDH with concussive syndrome of amnesia. She also has a nasal bone fracture and maxilla fracture    PT Comments    Patient demonstrates decreased vestibular use for balance and some possible L vestibular hypofunction that may be from history of vertigo years ago.  Likely some general queasiness from concussive syndrome, but seems to be progressing with mobility.  Feel continued outpatient PT for high level balance and vestibular rehab indicated to continue to progress to independent.   Follow Up Recommendations  Outpatient PT;Supervision for mobility/OOB;Supervision/Assistance - 24 hour (for high level balance and vestibular rehab)     Equipment Recommendations  None recommended by PT    Recommendations for Other Services       Precautions / Restrictions Precautions Precautions: Fall    Mobility  Bed Mobility Overal bed mobility: Modified Independent                Transfers Overall transfer level: Needs assistance Equipment used: 1 person hand held assist Transfers: Sit to/from Stand Sit to Stand: Min guard         General transfer comment: patient pulling up on my hand to stand  Ambulation/Gait             General Gait Details: was walking with spouse and OT when I enterd unit, noted no abnormalities of gait, but slower pace,    Stairs            Wheelchair Mobility    Modified Rankin (Stroke Patients Only)       Balance     Sitting balance-Leahy Scale: Good       Standing balance-Leahy Scale: Fair           Rhomberg - Eyes Opened: 30 Rhomberg - Eyes Closed: 10   High Level Balance Comments: demonstrates  increased sway in standing with feet together eyes closed and uncomfortable, but no physical assist provided.    Cognition Arousal/Alertness: Awake/alert Behavior During Therapy: Flat affect Overall Cognitive Status: Impaired/Different from baseline                 General Comments: Pt with slightly slowed responses and reports feeling "fuzzy".     Exercises      General Comments General comments (skin integrity, edema, etc.): Educated on vestibular connections and effects on balance, activity tolerance   Vestibular Assessment  05/25/16 0001  Symptom Behavior  Type of Dizziness Imbalance (nausea)  Frequency of Dizziness intermittent  Duration of Dizziness minutes  Aggravating Factors Activity in general;Turning head quickly  Relieving Factors Slow movements;Rest  Occulomotor Exam  Occulomotor Alignment Normal  Spontaneous Absent  Gaze-induced Absent  Smooth Pursuits Intact  Saccades Intact  Vestibulo-Occular Reflex  VOR 1 Head Only (x 1 viewing) 30 sec horizontal and vertical no increased symptoms except neck pain  VOR to Slow Head Movement Positive left  VOR Cancellation Normal  Auditory  Comments intact and equal to scratch test  Positional Testing  Sidelying Test Sidelying Right;Sidelying Left  Sidelying Right  Sidelying Right Duration 30 sec  Sidelying Right Symptoms No nystagmus  Sidelying Left  Sidelying Left Duration 30 sec  Sidelying Left Symptoms No nystagmus  Pertinent Vitals/Pain Faces Pain Scale: Hurts even more Pain Location: head and neck Pain Descriptors / Indicators: Grimacing;Guarding Pain Intervention(s): Monitored during session;Repositioned;Heat applied    Home Living                      Prior Function            PT Goals (current goals can now be found in the care plan section) Progress towards PT goals: Progressing toward goals    Frequency    Min 3X/week      PT Plan Discharge plan needs to be updated     Co-evaluation             End of Session Equipment Utilized During Treatment: Gait belt Activity Tolerance: Patient limited by fatigue Patient left: in bed;with call bell/phone within reach;with family/visitor present     Time: [id number] PT Time Calculation (min) (ACUTE ONLY): 20 min  Charges:  $Neuromuscular Re-education: 8-22 mins                    G Codes:      Elray McgregorCynthia Nycole Kawahara 05/25/2016, 1:04 PM  Sheran Lawlessyndi Faryn Sieg, PT [phone] 05/25/2016

## 2016-05-25 NOTE — Progress Notes (Signed)
  Subjective: Nausea better, ate a sandwich last night  Objective: Vital signs in last 24 hours: Temp:  [97.4 F (36.3 C)-98.1 F (36.7 C)] 98.1 F (36.7 C) (01/18 0000) Pulse Rate:  [64-77] 64 (01/18 0800) Resp:  [9-26] 10 (01/18 0800) BP: (112-140)/(63-92) 123/77 (01/18 0800) SpO2:  [98 %-99 %] 99 % (01/18 0800) Last BM Date: 05/23/16  Intake/Output from previous day: 01/17 0701 - 01/18 0700 In: 1103.3 [P.O.:622; I.V.:331.3; IV Piggyback:150] Out: -  Intake/Output this shift: No intake/output data recorded.  General appearance: alert and cooperative Head: facial edema improving, sutures in place Resp: clear to auscultation bilaterally Cardio: regular rate and rhythm GI: soft, NT Extremities: calves soft  Neuro: F/C, MAE, speech fluent  Lab Results: CBC  No results for input(s): WBC, HGB, HCT, PLT in the last 72 hours. BMET  Recent Labs  05/24/16 1826  05/25/16 0307 05/25/16 0710  NA 125*  < > 125* 126*  K 3.4*  --  3.8  --   CL 91*  --  92*  --   CO2 23  --  25  --   GLUCOSE 150*  --  106*  --   BUN 8  --  10  --   CREATININE 0.34*  --  0.33*  --   CALCIUM 8.9  --  8.8*  --   < > = values in this interval not displayed. PT/INR No results for input(s): LABPROT, INR in the last 72 hours. ABG No results for input(s): PHART, HCO3 in the last 72 hours.  Invalid input(s): PCO2, PO2  Studies/Results: No results found.  Anti-infectives: Anti-infectives    Start     Dose/Rate Route Frequency Ordered Stop   05/17/16 2000  ceFAZolin (ANCEF) IVPB 1 g/50 mL premix     1 g 100 mL/hr over 30 Minutes Intravenous Every 8 hours 05/17/16 1827        Assessment/Plan: Struck by tree branch TBI/tent SDH- exam improved, TBI team therapies, Dr. Franky Machoabbell following. Repeat HCT improved Nasal FX and impacted incisor Nasal and facial lacs- S/P repair by Dr. Kenney Housemanrab, I left a message with his service for him to come by and remove her sutures Hyponatremia 2/2 SIADH - I  D/W Dr. Signe ColtUpton this AM, appreciate her F/U FEN- tolerating PO better VTE- PAS Dipso- Home with HH once Na+ stable I spoke with her husband at the bedside.   LOS: 8 days    Violeta GelinasBurke Egypt Welcome, MD, MPH, FACS Trauma: [phone] General Surgery: [phone]  1/18/2018Patient ID: Annette Kidd, female   DOB: July 07, 1955, 61 y.o.   MRN: [id number]

## 2016-05-25 NOTE — Progress Notes (Addendum)
Occupational Therapy Treatment Patient Details Name: Annette Kidd MRN: [id number] DOB: Jun 23, 1955 Today's Date: 05/25/2016    History of present illness Patient is a 61 y/o female with no PMH was hit by falling branch while walking her dog. + LOC. GCS of 12 and admitted as level 1 trauma. Found to have a small supratentorial SDH with concussive syndrome of amnesia. She also has a nasal bone fracture and maxilla fracture   OT comments  Pt continues with very flat affect (although did smile briefly x 2).  She is very slow to initiate activity and moves slowly - difficult to ascertain if this is due to cognitive/behavioral changes, or a component of depression.  She moves in a very guarded fashion and is distracted by pain in neck, head, and shoulders, as well as feeling nauseated.  Pt was very active PTA and did yoga several x/week.   Incorporated modified yoga poses into activity to improve ROM and increase pain.   Encouraged spouse to bring in comfortable clothing as I feel she would benefit emotionally from changing out of gown and into pajamas or street clothing.   Please place orders when it is okay for pt to shower.  Will continue to follow acutely.     Follow Up Recommendations  Supervision/Assistance - 24 hour;Outpatient OT    Equipment Recommendations  None recommended by OT    Recommendations for Other Services      Precautions / Restrictions Precautions Precautions: Fall Precaution Comments: c/o dizziness        Mobility Bed Mobility Overal bed mobility: Modified Independent                Transfers Overall transfer level: Needs assistance Equipment used: 1 person hand held assist Transfers: Sit to/from Stand Sit to Stand: Min guard         General transfer comment: Pt spontaneosly reaches for spouse's or therapist's hand to stand and move     Balance     Sitting balance-Leahy Scale: Good       Standing balance-Leahy Scale: Fair       ADL Overall  ADL's : Needs assistance/impaired     Grooming: Wash/dry hands;Wash/dry face;Oral care;Brushing hair;Supervision/safety;Standing                   Toilet Transfer: Ambulation;Regular Toilet;Min guard   Toileting- Clothing Manipulation and Hygiene: Supervision/safety;Sit to/from stand       Functional mobility during ADLs: Min guard General ADL Comments: Pt is very guarded with movement and slow to initiate and perform tasks.   Pt fearful of donning pullover shirt due to fear of pain.  Instructed spouse to bring in button front or v-neck shirt as pt would benefit from getting into regular clothing      Vision                     Perception     Praxis      Cognition   Behavior During Therapy: Flat affect Overall Cognitive Status: Impaired/Different from baseline Area of Impairment: Problem solving              Problem Solving: Slow processing;Decreased initiation General Comments: Pt is slow to initiate activity and moves very slowly     Extremity/Trunk Assessment               Exercises Other Exercises Other Exercises: neck rotation Lt and Rt and lateral flexion Lt and Rt x 10 Other Exercises: Palms together reaching  bil. UEs overhead while breathing and depressing scaps Other Exercises: modified downward facing dog with hands on sink  Other Exercises: rotation Lt and Rt while seated with min faciliation for trunk extension    Shoulder Instructions       General Comments      Pertinent Vitals/ Pain       Pain Assessment: Faces Faces Pain Scale: Hurts even more Pain Location: head and neck Pain Descriptors / Indicators: Grimacing;Guarding Pain Intervention(s): Monitored during session;Repositioned;Other (comment) (gentle stretching )  Home Living                                          Prior Functioning/Environment              Frequency  Min 3X/week        Progress Toward Goals  OT Goals(current goals  can now be found in the care plan section)  Progress towards OT goals: Progressing toward goals     Plan Discharge plan needs to be updated    Co-evaluation                 End of Session     Activity Tolerance Patient tolerated treatment well   Patient Left in bed;with family/visitor present;Other (comment) (PT)   Nurse Communication Mobility status        Time: 0102-7253 OT Time Calculation (min): 59 min  Charges: OT General Charges $OT Visit: 1 Procedure OT Treatments $Self Care/Home Management : 23-37 mins $Therapeutic Activity: 23-37 mins  Narmeen Kerper M 05/25/2016, 1:33 PM

## 2016-05-26 LAB — SODIUM
SODIUM: 124 mmol/L — AB (ref 135–145)
SODIUM: 126 mmol/L — AB (ref 135–145)
SODIUM: 124 mmol/L — AB (ref 135–145)
Sodium: 127 mmol/L — ABNORMAL LOW (ref 135–145)

## 2016-05-26 LAB — BASIC METABOLIC PANEL
ANION GAP: 12 (ref 5–15)
BUN: 8 mg/dL (ref 6–20)
CALCIUM: 9.3 mg/dL (ref 8.9–10.3)
CHLORIDE: 87 mmol/L — AB (ref 101–111)
CO2: 25 mmol/L (ref 22–32)
Creatinine, Ser: 0.51 mg/dL (ref 0.44–1.00)
GFR calc non Af Amer: >60 mL/min (ref >60)
GLUCOSE: 101 mg/dL — AB (ref 65–99)
Potassium: 4 mmol/L (ref 3.5–5.1)
Sodium: 124 mmol/L — ABNORMAL LOW (ref 135–145)

## 2016-05-26 LAB — CORTISOL-AM, BLOOD: CORTISOL - AM: 16.5 ug/dL (ref 6.7–22.6)

## 2016-05-26 LAB — TSH: TSH: 5.023 u[IU]/mL — ABNORMAL HIGH (ref 0.350–4.500)

## 2016-05-26 LAB — T4, FREE: Free T4: 1.23 ng/dL — ABNORMAL HIGH (ref 0.61–1.12)

## 2016-05-26 MED ORDER — PROMETHAZINE HCL 25 MG/ML IJ SOLN: 12.5000 mg | INTRAMUSCULAR | Status: DC | PRN

## 2016-05-26 MED ORDER — FUROSEMIDE 40 MG PO TABS
40.0000 mg | ORAL_TABLET | Freq: Two times a day (BID) | ORAL | Status: DC
  Administered 2016-05-26 – 2016-05-28 (×4): 40 mg via ORAL
  Filled 2016-05-26 (×4): qty 1

## 2016-05-26 MED ORDER — FUROSEMIDE 20 MG PO TABS
20.0000 mg | ORAL_TABLET | Freq: Once | ORAL | Status: AC
  Administered 2016-05-26: 20 mg via ORAL
  Filled 2016-05-26: qty 1

## 2016-05-26 MED ORDER — SALINE SPRAY 0.65 % NA SOLN
2.0000 | Freq: Four times a day (QID) | NASAL | Status: DC
  Administered 2016-05-26 – 2016-05-29 (×12): 2 via NASAL
  Filled 2016-05-26: qty 44

## 2016-05-26 NOTE — Progress Notes (Signed)
Physical Therapy Treatment Patient Details Name: Annette Kidd MRN: [id number] DOB: [birth year] Today's Date: 05/26/2016    History of Present Illness Patient is a 61 y/o female with no PMH was hit by falling branch while walking her dog. + LOC. GCS of 12 and admitted as level 1 trauma. Found to have a small supratentorial SDH with concussive syndrome of amnesia. She also has a nasal bone fracture and maxilla fracture    PT Comments    Patient progressing slowly towards PT goals. Tolerated higher level balance challenges today with Min guard assist for balance/safety. Scored 17/24 on DGI indicating fall risk. Tolerated stair training as well with Min guard assist. Pt with slow, guarded gait. No dizziness reported today. Encouraged increasing cadence to improve balance for a more normalized gait pattern. Recommend walking with family multiple times per day. Will follow.   Follow Up Recommendations  Outpatient PT;Supervision for mobility/OOB;Supervision/Assistance - 24 hour     Equipment Recommendations  None recommended by PT    Recommendations for Other Services       Precautions / Restrictions Precautions Precautions: Fall Restrictions Weight Bearing Restrictions: No    Mobility  Bed Mobility               General bed mobility comments: Up in chair upon PT arrival.   Transfers Overall transfer level: Needs assistance Equipment used: None Transfers: Sit to/from Stand Sit to Stand: Min guard         General transfer comment: Min guard for safety. Stood from Orthoptistchair x1. No dizziness reported.   Ambulation/Gait Ambulation/Gait assistance: Min guard Ambulation Distance (Feet): 400 Feet Assistive device: 1 person hand held assist;None Gait Pattern/deviations: Step-through pattern;Decreased stride length;Drifts right/left Gait velocity: decr Gait velocity interpretation: Below normal speed for age/gender General Gait Details: Initially grabbing spouse's hand for support  progressing to no UE support for second lap. Slow guarded gait, tolerated higher level balance challenges (see balance section for details.).   Stairs Stairs: Yes   Stair Management: Alternating pattern;Step to pattern;One rail Right Number of Stairs: 13 General stair comments: Cues for technique and safety. Pt pausing between steps at times inconsistently. Step to gait to descend steps and step through to ascend.  Wheelchair Mobility    Modified Rankin (Stroke Patients Only) Modified Rankin (Stroke Patients Only) Pre-Morbid Rankin Score: No symptoms Modified Rankin: Moderately severe disability     Balance Overall balance assessment: Needs assistance Sitting-balance support: Feet supported;No upper extremity supported Sitting balance-Leahy Scale: Good     Standing balance support: During functional activity Standing balance-Leahy Scale: Good               High level balance activites: Side stepping;Backward walking;Direction changes;Sudden stops;Head turns;Turns High Level Balance Comments: Tolerated above higher level balance activities with only mild deviations in gait but no overt LOB.     Cognition Arousal/Alertness: Awake/alert Behavior During Therapy: Flat affect Overall Cognitive Status: Impaired/Different from baseline               Problem Solving: Slow processing;Decreased initiation      Exercises      General Comments        Pertinent Vitals/Pain Pain Assessment: Faces Faces Pain Scale: Hurts little more Pain Location: head and neck Pain Descriptors / Indicators: Grimacing;Guarding Pain Intervention(s): Monitored during session;Repositioned (gentle stretching)    Home Living                      Prior Function  PT Goals (current goals can now be found in the care plan section) Progress towards PT goals: Progressing toward goals    Frequency    Min 3X/week      PT Plan Current plan remains appropriate     Co-evaluation             End of Session Equipment Utilized During Treatment: Gait belt Activity Tolerance: Patient tolerated treatment well Patient left: in chair;with call bell/phone within reach;with family/visitor present;with nursing/sitter in room     Time: 1152-1209 PT Time Calculation (min) (ACUTE ONLY): 17 min  Charges:  $Neuromuscular Re-education: 8-22 mins                    G Codes:      Annette Kidd 05/26/2016, 12:20 PM Mylo Red, PT, DPT [phone]

## 2016-05-26 NOTE — Progress Notes (Addendum)
Pt transferred to [address] at 2040

## 2016-05-26 NOTE — Progress Notes (Signed)
[facility] Progress Note    Assessment/ Plan:   1.  Hyponatremia: consistent with SIADH.  - continue oral fluid restriction - can liberalized serum sodiums to q 4 hours - would not restart salt tabs as she does not have a total body sodium deficit and primary driver of hyponatremia is ADH - overall volume exam is improving, 20 mg PO Lasix BID added yesterday, will increase this to 40 PO BID - TSH a little high, adding on free T4. - AM cortisol pending  2.  TBI: likely source of SIADH.  Expect metabolic derangements to be temporary.  3.  Facial fractures: non-op   Subjective:    Still reports headache today but sitting up in chair and eating breakfast.   Objective:   BP 111/72   Pulse 84   Temp 97.8 F (36.6 C) (Oral)   Resp 16   Ht 5\' 6"  (1.676 m)   Wt 62.8 kg (138 lb 7.2 oz)   SpO2 100%   BMI 22.35 kg/m   Intake/Output Summary (Last 24 hours) at 05/26/16 1409 Last data filed at 05/26/16 1200  Gross per 24 hour  Intake             1090 ml  Output                0 ml  Net             1090 ml   Weight change:   Physical Exam: GEN: NAD sitting in chair HEENT: + scabbing and bruising of nose NECK no JVD PULM CTAB no c/w/r CV RRR no m/r/g ABD soft nontender nondistended EXT no LE edema NEURO responds appropriately to questions, nonfocal AAO x 3  Imaging: No results found.  Labs: BMET  Recent Labs Lab 05/23/16 1812 05/24/16 0147  05/24/16 1051  05/24/16 1613 05/24/16 1826  05/25/16 0307 05/25/16 0710 05/25/16 1331 05/25/16 1759 05/25/16 2139 05/26/16 0134 05/26/16 0834 05/26/16 1249  NA 133* 131*  < > 129*  < > 128* 125*  < > 125* 126* 123* 125* 124* 124* 124* 126*  K 3.6 3.8  --  3.6  --  3.6 3.4*  --  3.8  --   --   --   --   --  4.0  --   CL 101 99*  --  96*  --  92* 91*  --  92*  --   --   --   --   --  87*  --   CO2 21* 23  --  23  --  27 23  --  25  --   --   --   --   --  25  --   GLUCOSE 142* 113*  --  114*  --  118*  150*  --  106*  --   --   --   --   --  101*  --   BUN 15 14  --  9  --  7 8  --  10  --   --   --   --   --  8  --   CREATININE 0.72 0.43*  --  0.38*  --  0.33* 0.34*  --  0.33*  --   --   --   --   --  0.51  --   CALCIUM 8.8* 8.5*  --  8.8*  --  9.0 8.9  --  8.8*  --   --   --   --   --  9.3  --   < > = values in this interval not displayed. CBC No results for input(s): WBC, NEUTROABS, HGB, HCT, MCV, PLT in the last 168 hours.  Medications:    . bacitracin   Topical TID  . calcium carbonate  1 tablet Oral Q breakfast  . cephALEXin  500 mg Oral Q8H  . cholecalciferol  1,000 Units Oral Daily  . docusate sodium  100 mg Oral BID  . feeding supplement  1 Container Oral BID BM  . furosemide  40 mg Oral BID  . pantoprazole  40 mg Oral Daily  . polyethylene glycol  17 g Oral Daily  . sodium chloride  2 spray Each Nare QID  . sodium chloride flush  10 mL Intravenous Q12H  . sodium chloride flush  3 mL Intravenous Q12H      Bufford Buttner, MD 05/26/2016, 2:09 PM

## 2016-05-26 NOTE — Progress Notes (Signed)
Patient ID: Annette Kidd, female   DOB: 06/14/1955, 61 y.o.   MRN: [id number]   LOS: 9 days   Subjective: Nauseated again this morning but had a good afternoon and evening.   Objective: Vital signs in last 24 hours: Temp:  [97.5 F (36.4 C)-98.2 F (36.8 C)] 98.2 F (36.8 C) (01/19 0800) Pulse Rate:  [70] 70 (01/18 1000) Resp:  [9-20] 11 (01/19 0600) BP: (116-144)/(68-79) 120/68 (01/19 0600) SpO2:  [99 %] 99 % (01/18 1000) Last BM Date: 05/23/16   Laboratory  BMET  Recent Labs  05/24/16 1826  05/25/16 0307  05/25/16 2139 05/26/16 0134  NA 125*  < > 125*  < > 124* 124*  K 3.4*  --  3.8  --   --   --   CL 91*  --  92*  --   --   --   CO2 23  --  25  --   --   --   GLUCOSE 150*  --  106*  --   --   --   BUN 8  --  10  --   --   --   CREATININE 0.34*  --  0.33*  --   --   --   CALCIUM 8.9  --  8.8*  --   --   --   < > = values in this interval not displayed.   Physical Exam General appearance: alert and no distress Resp: clear to auscultation bilaterally Cardio: regular rate and rhythm GI: normal findings: bowel sounds normal and soft, non-tender Pulses: 2+ and symmetric   Assessment/Plan: Struck by tree branch TBI/tent SDH- exam improved, TBI team therapies, Dr. Franky Machoabbell following. Repeat HCT improved Nasal FX and impacted incisor-- Will give NaCl nasal spray Nasal and facial lacs- S/P repair by Dr. Kenney Housemanrab, I left a message with his service for him to come by and remove her sutures Hyponatremia 2/2 SIADH- Nephrology following, stable from last night but down overall FEN- tolerating PO better VTE- SCD's Dipso- Can transfer to SDU again    Freeman CaldronMichael J. Lakechia Nay, PA-C Pager: [phone] General Trauma PA Pager: [phone]  05/26/2016

## 2016-05-26 NOTE — Progress Notes (Signed)
Occupational Therapy Treatment Patient Details Name: Annette Kidd MRN: [id number] DOB: 01-29-1956 Today's Date: 05/26/2016    History of present illness Patient is a 61 y/o female with no PMH was hit by falling branch while walking her dog. + LOC. GCS of 12 and admitted as level 1 trauma. Found to have a small supratentorial SDH with concussive syndrome of amnesia. She also has a nasal bone fracture and maxilla fracture   OT comments  Pt tolerating exercises with OT this session to help with overall neck pain and discomfort. Pt reports "that bed is horrible. I am just so stiff" . Pt able to following commands and follow along with handout. Pt needed encouragement initially to engaged.    Follow Up Recommendations  Supervision/Assistance - 24 hour;Outpatient OT    Equipment Recommendations  None recommended by OT    Recommendations for Other Services      Precautions / Restrictions Precautions Precautions: Fall Precaution Comments: c/o dizziness  Restrictions Weight Bearing Restrictions: No       Mobility Bed Mobility               General bed mobility comments: in chair on arrival  Transfers Overall transfer level: Needs assistance Equipment used: None Transfers: Sit to/from Stand Sit to Stand: Min guard         General transfer comment: pt very distracted by lines and leads. pt frustrated and expressed "i can do better without all this crap on me"     Balance Overall balance assessment: Needs assistance Sitting-balance support: Bilateral upper extremity supported;Feet supported Sitting balance-Leahy Scale: Good     Standing balance support: During functional activity Standing balance-Leahy Scale: Good               High level balance activites: Side stepping;Backward walking;Direction changes;Sudden stops;Head turns;Turns High Level Balance Comments: Tolerated above higher level balance activities with only mild deviations in gait but no overt LOB.     ADL Overall ADL's : Needs assistance/impaired                                       General ADL Comments: Session focused on stretching using yoga poses which patient enjoys at baseline. Pt provided chair based yoga poses to help with stretching and incr ROM neck. pt able to complete 6 out 8 poses. pt declined to reach toward feet due to concern for HA. pt reports nausea earlier in the day but currently resolve. Spouse present and daughter arriving after initiation of session and asking questions. Daughter more guarded with mother participation with therapy. pt benefits from minimal family during session.  Pt completed chair transfer and static standing to complete yoga poses.       Vision                     Perception     Praxis      Cognition   Behavior During Therapy: Flat affect Overall Cognitive Status: Impaired/Different from baseline Area of Impairment: Problem solving              Problem Solving: Slow processing;Decreased initiation General Comments: Pt with slow processing and when distracted with generalized information about patient progressing without awareness. if just asked to engaged reports "i just dont feel well"    Extremity/Trunk Assessment               Exercises  Other Exercises Other Exercises: neck rotation, scapula retraction Other Exercises: trunk rotation with static base Other Exercises: bil elbow in extension, hip flexion, chest elongation, neck extension Other Exercises: static standing with bil UE full extension with 90 degrees hip flexion head in neutral    Shoulder Instructions       General Comments      Pertinent Vitals/ Pain       Pain Assessment: Faces Faces Pain Scale: Hurts little more Pain Location: neck Pain Descriptors / Indicators: Sore Pain Intervention(s): Monitored during session;Premedicated before session;Repositioned  Home Living                                           Prior Functioning/Environment              Frequency  Min 3X/week        Progress Toward Goals  OT Goals(current goals can now be found in the care plan section)  Progress towards OT goals: Progressing toward goals  Acute Rehab OT Goals Patient Stated Goal: to feel better OT Goal Formulation: With patient Time For Goal Achievement: 06/09/16 Potential to Achieve Goals: Good ADL Goals Pt Will Perform Grooming: with min guard assist;standing Pt Will Perform Upper Body Bathing: with set-up;with supervision;sitting;standing Pt Will Perform Lower Body Bathing: with set-up;with supervision;sit to/from stand Pt Will Perform Upper Body Dressing: with set-up;with supervision;standing;sitting  Plan Discharge plan needs to be updated    Co-evaluation                 End of Session Equipment Utilized During Treatment: Gait belt   Activity Tolerance Patient tolerated treatment well   Patient Left Other (comment) (in hall with PT Grit.Laddhauna)   Nurse Communication Mobility status;Precautions        Time: [id number] OT Time Calculation (min): 14 min  Charges: OT General Charges $OT Visit: 1 Procedure OT Treatments $Therapeutic Activity: 8-22 mins  Boone MasterJones, Roldan Laforest B 05/26/2016, 1:15 PM  Mateo FlowJones, Brynn   OTR/L Pager: [phone] Office: [phone] .

## 2016-05-27 LAB — SODIUM
Sodium: 127 mmol/L — ABNORMAL LOW (ref 135–145)
Sodium: 128 mmol/L — ABNORMAL LOW (ref 135–145)
Sodium: 129 mmol/L — ABNORMAL LOW (ref 135–145)

## 2016-05-27 LAB — RENAL FUNCTION PANEL
ALBUMIN: 3.9 g/dL (ref 3.5–5.0)
Anion gap: 13 (ref 5–15)
BUN: 13 mg/dL (ref 6–20)
CALCIUM: 9.7 mg/dL (ref 8.9–10.3)
CO2: 22 mmol/L (ref 22–32)
CREATININE: 0.58 mg/dL (ref 0.44–1.00)
Chloride: 91 mmol/L — ABNORMAL LOW (ref 101–111)
GFR calc Af Amer: >60 mL/min (ref >60)
GLUCOSE: 120 mg/dL — AB (ref 65–99)
PHOSPHORUS: 4.7 mg/dL — AB (ref 2.5–4.6)
Potassium: 4.3 mmol/L (ref 3.5–5.1)
SODIUM: 126 mmol/L — AB (ref 135–145)

## 2016-05-27 MED ORDER — BISACODYL 10 MG RE SUPP
10.0000 mg | Freq: Once | RECTAL | Status: AC
  Administered 2016-05-27: 10 mg via RECTAL
  Filled 2016-05-27: qty 1

## 2016-05-27 NOTE — Progress Notes (Signed)
[facility] Progress Note     Subjective: Pt is feeling good. Walked the hall this morning with no dizziness. Concerned that she needs to have a BM but hasn't. Its been 3 days since her last BM. No other complaints.   Objective: Vital signs in last 24 hours: Temp:  [97.8 F (36.6 C)-98.4 F (36.9 C)] 98 F (36.7 C) (01/20 0700) Pulse Rate:  [66-84] 69 (01/20 0700) Resp:  [10-20] 10 (01/20 0700) BP: (104-125)/(57-74) 107/74 (01/20 0700) SpO2:  [98 %-100 %] 99 % (01/20 0700) Last BM Date: 05/23/16  Intake/Output from previous day: 01/19 0701 - 01/20 0700 In: 1000 [P.O.:990; I.V.:10] Out: 325 [Urine:325] Intake/Output this shift: Total I/O In: 150 [P.O.:150] Out: -   PE: Gen:  Alert, NAD, pleasant, cooperative, lying in bed Card:  RRR, no M/G/R heard Pulm:  CTA, rate and effort normal Abd: Soft, nondistended, +BS, non tender Skin: no rashes noted, warm and dry  Lab Results:  No results for input(s): WBC, HGB, HCT, PLT in the last 72 hours. BMET  Recent Labs  05/25/16 0307  05/26/16 0834  05/26/16 2313 05/27/16 0349  NA 125*  < > 124*  < > 127* 127*  K 3.8  --  4.0  --   --   --   CL 92*  --  87*  --   --   --   CO2 25  --  25  --   --   --   GLUCOSE 106*  --  101*  --   --   --   BUN 10  --  8  --   --   --   CREATININE 0.33*  --  0.51  --   --   --   CALCIUM 8.8*  --  9.3  --   --   --   < > = values in this interval not displayed. PT/INR No results for input(s): LABPROT, INR in the last 72 hours. CMP     Component Value Date/Time   NA 127 (L) 05/27/2016 0349   K 4.0 05/26/2016 0834   CL 87 (L) 05/26/2016 0834   CO2 25 05/26/2016 0834   GLUCOSE 101 (H) 05/26/2016 0834   BUN 8 05/26/2016 0834   CREATININE 0.51 05/26/2016 0834   CALCIUM 9.3 05/26/2016 0834   PROT 6.6 05/17/2016 1000   ALBUMIN 4.1 05/17/2016 1000   AST 28 05/17/2016 1000   ALT 20 05/17/2016 1000   ALKPHOS 64 05/17/2016 1000   BILITOT 0.7 05/17/2016 1000   GFRNONAA >60  05/26/2016 0834   GFRAA >60 05/26/2016 0834   Lipase  No results found for: LIPASE     Studies/Results: No results found.  Anti-infectives: Anti-infectives    Start     Dose/Rate Route Frequency Ordered Stop   05/25/16 0930  cephALEXin (KEFLEX) capsule 500 mg     500 mg Oral Every 8 hours 05/25/16 0840     05/17/16 2000  ceFAZolin (ANCEF) IVPB 1 g/50 mL premix  Status:  Discontinued     1 g 100 mL/hr over 30 Minutes Intravenous Every 8 hours 05/17/16 1827 05/25/16 0840       Assessment/Plan  Struck by tree branch TBI/tent SDH- exam improved, TBI team therapies, Dr. Franky Macho following. Repeat HCT improved Nasal FX and impacted incisor-- Will give NaCl nasal spray Nasal and facial lacs- S/P repair by Dr. Kenney Houseman, left message with his service for him to come by and remove her sutures Hyponatremia  2/2 SIADH- Nephrology following, stable from last night but down overall, holding salt tabs and giving 40mg  PO lasix, oral fluid restrictions.   Constipation: bowel regiment, added suppository PRN  FEN- tolerating PO better, DYS 3 diet VTE- SCD's Dipso- SDU, need hyponatremia to resolve prior to discharge   LOS: 10 days    Jerre SimonJessica L Chayah Mckee , [facility] 05/27/2016, 10:53 AM Pager: [phone] Consults: [phone] Mon-Fri 7:00 am-4:30 pm Sat-Sun 7:00 am-11:30 am

## 2016-05-27 NOTE — Progress Notes (Signed)
[facility] Progress Note    Assessment/ Plan:   1.  Hyponatremia: consistent with SIADH.  - continue oral fluid restriction - can liberalized serum sodiums to BID - would not restart salt tabs as she does not have a total body sodium deficit and primary driver of hyponatremia is ADH - overall volume exam is improving, continue Lasix 40 PO BID - TSH a little high, free T4 slightly elevated--> all likely in setting of acute illness, nothing contributory - AM cortisol normal  2.  TBI: likely source of SIADH.  Expect metabolic derangements to be temporary.  3.  Facial fractures: non-op   Subjective:    Doing better.     Objective:   BP 107/74 (BP Location: Left Arm)   Pulse 69   Temp 98.3 F (36.8 C) (Oral)   Resp 10   Ht 5\' 6"  (1.676 m)   Wt 62.8 kg (138 lb 7.2 oz)   SpO2 99%   BMI 22.35 kg/m   Intake/Output Summary (Last 24 hours) at 05/27/16 1347 Last data filed at 05/27/16 1000  Gross per 24 hour  Intake              540 ml  Output              325 ml  Net              215 ml   Weight change:   Physical Exam: GEN: NAD sitting in chair HEENT: + scabbing and bruising of nose, + stitches NECK no JVD PULM CTAB no c/w/r CV RRR no m/r/g ABD soft nontender nondistended EXT no LE edema NEURO responds appropriately to questions, nonfocal AAO x 3  Imaging: No results found.  Labs: BMET  Recent Labs Lab 05/23/16 1812 05/24/16 0147  05/24/16 1051  05/24/16 1613 05/24/16 1826  05/25/16 0307  05/26/16 0134 05/26/16 0834 05/26/16 1249 05/26/16 1921 05/26/16 2313 05/27/16 0349 05/27/16 1027  NA 133* 131*  < > 129*  < > 128* 125*  < > 125*  < > 124* 124* 126* 124* 127* 127* 129*  128*  K 3.6 3.8  --  3.6  --  3.6 3.4*  --  3.8  --   --  4.0  --   --   --   --   --   CL 101 99*  --  96*  --  92* 91*  --  92*  --   --  87*  --   --   --   --   --   CO2 21* 23  --  23  --  27 23  --  25  --   --  25  --   --   --   --   --   GLUCOSE 142*  113*  --  114*  --  118* 150*  --  106*  --   --  101*  --   --   --   --   --   BUN 15 14  --  9  --  7 8  --  10  --   --  8  --   --   --   --   --   CREATININE 0.72 0.43*  --  0.38*  --  0.33* 0.34*  --  0.33*  --   --  0.51  --   --   --   --   --   CALCIUM  8.8* 8.5*  --  8.8*  --  9.0 8.9  --  8.8*  --   --  9.3  --   --   --   --   --   < > = values in this interval not displayed. CBC No results for input(s): WBC, NEUTROABS, HGB, HCT, MCV, PLT in the last 168 hours.  Medications:    . bacitracin   Topical TID  . calcium carbonate  1 tablet Oral Q breakfast  . cephALEXin  500 mg Oral Q8H  . cholecalciferol  1,000 Units Oral Daily  . docusate sodium  100 mg Oral BID  . feeding supplement  1 Container Oral BID BM  . furosemide  40 mg Oral BID  . pantoprazole  40 mg Oral Daily  . polyethylene glycol  17 g Oral Daily  . sodium chloride  2 spray Each Nare QID  . sodium chloride flush  10 mL Intravenous Q12H  . sodium chloride flush  3 mL Intravenous Q12H      Bufford ButtnerElizabeth Chanequa Spees, MD 05/27/2016, 1:47 PM

## 2016-05-28 LAB — RENAL FUNCTION PANEL
ANION GAP: 9 (ref 5–15)
Albumin: 3.3 g/dL — ABNORMAL LOW (ref 3.5–5.0)
Albumin: 3.4 g/dL — ABNORMAL LOW (ref 3.5–5.0)
Anion gap: 9 (ref 5–15)
BUN: 10 mg/dL (ref 6–20)
BUN: 13 mg/dL (ref 6–20)
CHLORIDE: 92 mmol/L — AB (ref 101–111)
CHLORIDE: 92 mmol/L — AB (ref 101–111)
CO2: 28 mmol/L (ref 22–32)
CO2: 29 mmol/L (ref 22–32)
CREATININE: 0.53 mg/dL (ref 0.44–1.00)
Calcium: 9.4 mg/dL (ref 8.9–10.3)
Calcium: 9.1 mg/dL (ref 8.9–10.3)
Creatinine, Ser: 0.72 mg/dL (ref 0.44–1.00)
GFR calc Af Amer: >60 mL/min (ref >60)
GFR calc Af Amer: >60 mL/min (ref >60)
GFR calc non Af Amer: >60 mL/min (ref >60)
GFR calc non Af Amer: >60 mL/min (ref >60)
GLUCOSE: 102 mg/dL — AB (ref 65–99)
GLUCOSE: 136 mg/dL — AB (ref 65–99)
PHOSPHORUS: 4.9 mg/dL — AB (ref 2.5–4.6)
POTASSIUM: 3.8 mmol/L (ref 3.5–5.1)
Phosphorus: 4.8 mg/dL — ABNORMAL HIGH (ref 2.5–4.6)
Potassium: 3.5 mmol/L (ref 3.5–5.1)
SODIUM: 129 mmol/L — AB (ref 135–145)
Sodium: 130 mmol/L — ABNORMAL LOW (ref 135–145)

## 2016-05-28 MED ORDER — FUROSEMIDE 40 MG PO TABS
60.0000 mg | ORAL_TABLET | Freq: Two times a day (BID) | ORAL | Status: DC
  Administered 2016-05-28 – 2016-05-29 (×2): 60 mg via ORAL
  Filled 2016-05-28 (×2): qty 2

## 2016-05-28 MED ORDER — FUROSEMIDE 40 MG PO TABS
20.0000 mg | ORAL_TABLET | Freq: Once | ORAL | Status: AC
  Administered 2016-05-28: 20 mg via ORAL
  Filled 2016-05-28: qty 1

## 2016-05-28 NOTE — Progress Notes (Signed)
[facility] Progress Note     Subjective: Pt had multiple BM's yesterday. Feeling better. Anxious to go home. No new complaints. No fever or chills overnight.  Objective: Vital signs in last 24 hours: Temp:  [97.8 F (36.6 C)-98.6 F (37 C)] 97.8 F (36.6 C) (01/21 0716) Pulse Rate:  [63-84] 63 (01/21 0716) Resp:  [10-17] 14 (01/21 0318) BP: (100-120)/(53-72) 106/63 (01/21 0716) SpO2:  [98 %-100 %] 100 % (01/21 0716) Last BM Date: 05/27/16  Intake/Output from previous day: 01/20 0701 - 01/21 0700 In: 580 [P.O.:580] Out: 500 [Urine:500] Intake/Output this shift: Total I/O In: 240 [P.O.:240] Out: 550 [Urine:550]  PE: Gen:  Alert, NAD, pleasant, cooperative, lying in bed Card:  RRR, no M/G/R heard Pulm:  CTA anteriorly, rate and effort normal Abd: Soft, nondistended, +BS, non tender Skin: no rashes noted, warm and dry  Lab Results:  No results for input(s): WBC, HGB, HCT, PLT in the last 72 hours. BMET  Recent Labs  05/27/16 1605 05/28/16 0509  NA 126* 129*  K 4.3 3.5  CL 91* 92*  CO2 22 28  GLUCOSE 120* 102*  BUN 13 10  CREATININE 0.58 0.53  CALCIUM 9.7 9.4   PT/INR No results for input(s): LABPROT, INR in the last 72 hours. CMP     Component Value Date/Time   NA 129 (L) 05/28/2016 0509   K 3.5 05/28/2016 0509   CL 92 (L) 05/28/2016 0509   CO2 28 05/28/2016 0509   GLUCOSE 102 (H) 05/28/2016 0509   BUN 10 05/28/2016 0509   CREATININE 0.53 05/28/2016 0509   CALCIUM 9.4 05/28/2016 0509   PROT 6.6 05/17/2016 1000   ALBUMIN 3.3 (L) 05/28/2016 0509   AST 28 05/17/2016 1000   ALT 20 05/17/2016 1000   ALKPHOS 64 05/17/2016 1000   BILITOT 0.7 05/17/2016 1000   GFRNONAA >60 05/28/2016 0509   GFRAA >60 05/28/2016 0509   Lipase  No results found for: LIPASE     Studies/Results: No results found.  Anti-infectives: Anti-infectives    Start     Dose/Rate Route Frequency Ordered Stop   05/25/16 0930  cephALEXin (KEFLEX) capsule 500 mg      500 mg Oral Every 8 hours 05/25/16 0840     05/17/16 2000  ceFAZolin (ANCEF) IVPB 1 g/50 mL premix  Status:  Discontinued     1 g 100 mL/hr over 30 Minutes Intravenous Every 8 hours 05/17/16 1827 05/25/16 0840       Assessment/Plan  Struck by tree branch TBI/tent SDH- exam improved, TBI team therapies, Dr. Franky Machoabbell following. Repeat HCT improved Nasal FX and impacted incisor-- Will give NaCl nasal spray Nasal and facial lacs- S/P repair by Dr. Kenney Housemanrab, left message with his service for him to come by and remove her sutures Hyponatremia 2/2 SIADH- Nephrology following, stable from last night but down overall, holding salt tabs and giving 40mg  PO lasix, oral fluid restrictions. Rechecking sodium at 5pm. Could leave if Na130 but pt and family would prefer to stay until tomorrow morning.  Constipation: bowel regiment, added suppository PRN, having BM's today  FEN- tolerating PO better, DYS 3 diet VTE- SCD's Dipso- SDU, need hyponatremia to resolve prior to discharge   LOS: 11 days    Jerre SimonJessica L Witt Plitt , [facility] 05/28/2016, 10:17 AM Pager: [phone] Consults: [phone] Mon-Fri 7:00 am-4:30 pm Sat-Sun 7:00 am-11:30 am

## 2016-05-28 NOTE — Progress Notes (Signed)
[facility] Progress Note    Assessment/ Plan:   1.  Hyponatremia: consistent with SIADH.  - continue oral fluid restriction 1L -  sodiums BID - would not restart salt tabs as she does not have a total body sodium deficit and primary driver of hyponatremia is ADH - overall volume exam is improving, increase Lasix 60 PO BID  - TSH a little high, free T4 slightly elevated--> all likely in setting of acute illness, nothing contributory - AM cortisol normal - can d/c once Na 130 or above - will need close nephrology f/u for repeat labs and Lasix titration  2.  TBI: likely source of SIADH.  Expect metabolic derangements to be temporary.  3.  Facial fractures: non-op   Subjective:    Doing better.  Stitches out of face.  Na hovering at 129   Objective:   BP 106/63 (BP Location: Right Arm)   Pulse 63   Temp 97.8 F (36.6 C) (Oral)   Resp 14   Ht 5\' 6"  (1.676 m)   Wt 62.8 kg (138 lb 7.2 oz)   SpO2 100%   BMI 22.35 kg/m   Intake/Output Summary (Last 24 hours) at 05/28/16 1021 Last data filed at 05/28/16 0900  Gross per 24 hour  Intake              670 ml  Output             1050 ml  Net             -380 ml   Weight change:   Physical Exam: GEN: NAD sitting in chair HEENT: + scabbing and bruising of nose, + stitches NECK no JVD PULM CTAB no c/w/r CV RRR no m/r/g ABD soft nontender nondistended EXT no LE edema NEURO responds appropriately to questions, nonfocal AAO x 3  Imaging: No results found.  Labs: BMET  Recent Labs Lab 05/24/16 1051  05/24/16 1613 05/24/16 1826  05/25/16 0307  05/26/16 0834 05/26/16 1249 05/26/16 1921 05/26/16 2313 05/27/16 0349 05/27/16 1027 05/27/16 1605 05/28/16 0509  NA 129*  < > 128* 125*  < > 125*  < > 124* 126* 124* 127* 127* 129*  128* 126* 129*  K 3.6  --  3.6 3.4*  --  3.8  --  4.0  --   --   --   --   --  4.3 3.5  CL 96*  --  92* 91*  --  92*  --  87*  --   --   --   --   --  91* 92*  CO2 23  --  27  23  --  25  --  25  --   --   --   --   --  22 28  GLUCOSE 114*  --  118* 150*  --  106*  --  101*  --   --   --   --   --  120* 102*  BUN 9  --  7 8  --  10  --  8  --   --   --   --   --  13 10  CREATININE 0.38*  --  0.33* 0.34*  --  0.33*  --  0.51  --   --   --   --   --  0.58 0.53  CALCIUM 8.8*  --  9.0 8.9  --  8.8*  --  9.3  --   --   --   --   --  9.7 9.4  PHOS  --   --   --   --   --   --   --   --   --   --   --   --   --  4.7* 4.8*  < > = values in this interval not displayed. CBC No results for input(s): WBC, NEUTROABS, HGB, HCT, MCV, PLT in the last 168 hours.  Medications:    . bacitracin   Topical TID  . calcium carbonate  1 tablet Oral Q breakfast  . cephALEXin  500 mg Oral Q8H  . cholecalciferol  1,000 Units Oral Daily  . docusate sodium  100 mg Oral BID  . feeding supplement  1 Container Oral BID BM  . furosemide  20 mg Oral Once  . furosemide  60 mg Oral BID  . pantoprazole  40 mg Oral Daily  . polyethylene glycol  17 g Oral Daily  . sodium chloride  2 spray Each Nare QID  . sodium chloride flush  10 mL Intravenous Q12H  . sodium chloride flush  3 mL Intravenous Q12H      Bufford Buttner, MD 05/28/2016, 10:21 AM

## 2016-05-29 ENCOUNTER — Encounter: Payer: Self-pay | Admitting: Sports Medicine

## 2016-05-29 DIAGNOSIS — S098XXA Other specified injuries of head, initial encounter: Secondary | ICD-10-CM | POA: Diagnosis present

## 2016-05-29 DIAGNOSIS — S022XXB Fracture of nasal bones, initial encounter for open fracture: Secondary | ICD-10-CM | POA: Diagnosis present

## 2016-05-29 DIAGNOSIS — S0181XA Laceration without foreign body of other part of head, initial encounter: Secondary | ICD-10-CM | POA: Diagnosis present

## 2016-05-29 DIAGNOSIS — E222 Syndrome of inappropriate secretion of antidiuretic hormone: Secondary | ICD-10-CM | POA: Diagnosis not present

## 2016-05-29 LAB — RENAL FUNCTION PANEL
ANION GAP: 9 (ref 5–15)
Albumin: 3.5 g/dL (ref 3.5–5.0)
BUN: 13 mg/dL (ref 6–20)
CHLORIDE: 94 mmol/L — AB (ref 101–111)
CO2: 28 mmol/L (ref 22–32)
Calcium: 9.4 mg/dL (ref 8.9–10.3)
Creatinine, Ser: 0.53 mg/dL (ref 0.44–1.00)
GFR calc non Af Amer: >60 mL/min (ref >60)
Glucose, Bld: 98 mg/dL (ref 65–99)
Phosphorus: 4.4 mg/dL (ref 2.5–4.6)
Potassium: 3.4 mmol/L — ABNORMAL LOW (ref 3.5–5.1)
Sodium: 131 mmol/L — ABNORMAL LOW (ref 135–145)

## 2016-05-29 MED ORDER — ONDANSETRON HCL 4 MG PO TABS: 4.0000 mg | ORAL_TABLET | ORAL | 0 refills | Status: DC | PRN

## 2016-05-29 MED ORDER — DOCUSATE SODIUM 100 MG PO CAPS: 200.0000 mg | ORAL_CAPSULE | Freq: Two times a day (BID) | ORAL | Status: DC

## 2016-05-29 MED ORDER — POLYETHYLENE GLYCOL 3350 17 G PO PACK: 17.0000 g | PACK | Freq: Two times a day (BID) | ORAL | Status: DC

## 2016-05-29 MED ORDER — FUROSEMIDE 20 MG PO TABS: 60.0000 mg | ORAL_TABLET | Freq: Two times a day (BID) | ORAL | 0 refills | Status: DC

## 2016-05-29 MED ORDER — TRAMADOL HCL 50 MG PO TABS: 50.0000 mg | ORAL_TABLET | Freq: Four times a day (QID) | ORAL | 0 refills | Status: DC | PRN

## 2016-05-29 NOTE — Progress Notes (Signed)
Physical Therapy Treatment Patient Details Name: Annette Kidd MRN: [id number] DOB: [birth year] Today's Date: 05/29/2016    History of Present Illness Patient is a 61 y/o female with no PMH was hit by falling branch while walking her dog. + LOC. GCS of 12 and admitted as level 1 trauma. Found to have a small supratentorial SDH with concussive syndrome of amnesia. She also has a nasal bone fracture and maxilla fracture    PT Comments    Progressing well, educated on mobility and safety expectations for discharge. Spoke with patient about post TBI strategies for activity. Current POC remains appropriate.  Follow Up Recommendations  Outpatient PT;Supervision for mobility/OOB;Supervision/Assistance - 24 hour     Equipment Recommendations  None recommended by PT    Recommendations for Other Services OT consult     Precautions / Restrictions Precautions Precautions: Fall Precaution Comments: c/o dizziness  Restrictions Weight Bearing Restrictions: No    Mobility  Bed Mobility Overal bed mobility: Modified Independent Bed Mobility: Rolling;Supine to Sit Rolling: Supervision Sidelying to sit: Supervision       General bed mobility comments: Supervision to perform, no phyiscal assist required. HOB elevated  Transfers Overall transfer level: Needs assistance Equipment used: None Transfers: Sit to/from Stand Sit to Stand: Supervision         General transfer comment: No physical assist required, supervision for safety  Ambulation/Gait Ambulation/Gait assistance: Supervision Ambulation Distance (Feet): 460 Feet Assistive device: 1 person hand held assist;None Gait Pattern/deviations: Step-through pattern;Decreased stride length;Drifts right/left Gait velocity: decreased Gait velocity interpretation: Below normal speed for age/gender General Gait Details: Able to walk without HHA but ocassionally reaching out for daughters hand during session (appears more for comfort, not  necessity)   Stairs            Wheelchair Mobility    Modified Rankin (Stroke Patients Only) Modified Rankin (Stroke Patients Only) Pre-Morbid Rankin Score: No symptoms Modified Rankin: Moderately severe disability     Balance Overall balance assessment: Needs assistance Sitting-balance support: Bilateral upper extremity supported;Feet supported Sitting balance-Leahy Scale: Good     Standing balance support: During functional activity Standing balance-Leahy Scale: Good Standing balance comment: performed head turns, and navigation without assist                    Cognition Arousal/Alertness: Awake/alert Behavior During Therapy: Flat affect Overall Cognitive Status: Impaired/Different from baseline Area of Impairment: Problem solving             Problem Solving: Slow processing;Decreased initiation General Comments: Patient continues to demonstrate slow processing intermittently during session    Exercises      General Comments General comments (skin integrity, edema, etc.): spoke with patient and daughter at length regarding mobility expecations for home, post concussive symptoms and environmental strategies post TBI. Patient and daugther receptive.       Pertinent Vitals/Pain Pain Assessment: Faces Faces Pain Scale: Hurts little more Pain Location: headache Pain Descriptors / Indicators: Headache Pain Intervention(s): Monitored during session    Home Living                      Prior Function            PT Goals (current goals can now be found in the care plan section) Acute Rehab PT Goals Patient Stated Goal: to feel better PT Goal Formulation: With patient/family Time For Goal Achievement: 06/01/16 Potential to Achieve Goals: Good Progress towards PT goals: Progressing toward goals  Frequency    Min 3X/week      PT Plan Current plan remains appropriate    Co-evaluation             End of Session Equipment  Utilized During Treatment: Gait belt Activity Tolerance: Patient tolerated treatment well Patient left:  (walking hall with MD and daughter)     Time: [id number] PT Time Calculation (min) (ACUTE ONLY): 19 min  Charges:  $Gait Training: 8-22 mins                    G Codes:      Fabio AsaDevon J [name] 05/29/2016, 12:07 PM Charlotte Crumbevon Aleta Manternach, PT DPT  [phone]

## 2016-05-29 NOTE — Discharge Summary (Signed)
Physician Discharge Summary  Patient ID: Annette Kidd MRN: [id number] DOB/AGE: 09-03-1955 61 y.o.  Admit date: 05/17/2016 Discharge date: 05/29/2016  Discharge Diagnoses Patient Active Problem List   Diagnosis Date Noted  . Open nasal fracture 05/29/2016  . Facial laceration 05/29/2016  . Blunt head trauma 05/29/2016  . SIADH (syndrome of inappropriate ADH production) (HCC) 05/29/2016  . TBI (traumatic brain injury) (HCC) 05/17/2016    Consultants Dr. Jill AlexandersJustin Drab for OMFS  Dr. Coletta MemosKyle Cabbell for neurosurgery  Dr. Bufford ButtnerElizabeth Upton for nephrology   Procedures 1/10 -- Repair of superficial 1.5cm laceration in right nasolacrimal groove, repair of complex/deep 3cm oblique laceration of the nasal dorsum, and repair of superficial 0.5cm laceration in the midline of the upper philtrum area by Dr. Kenney Housemanrab   HPI: Annette Kidd was walking her dog when a tree limb came down and kicked back up striking her in the face. She was knocked off her feet and lost consciousness. She was brought in by EMS with a GCS of 12 and borderline hypotension as a level 1 trauma activation. On arrival her GCS was 14 and her SBP was 100 so she was downgraded to a level 2. The EDP completed her workup which showed a nasal fracture and a small subdural hematoma. Neurosugery and OMFS were consulted and trauma was asked to admit. OMFS repaired her facial lacerations in the ED.   Hospital Course: Neurosurgery recommended non-operative treatment of her brain injury. A repeat head CT the following day was stable. She was moderately symptomatic from her brain injury which waxed and waned over the course of her hospitalization. She quickly developed SIADH that proved difficult to treat. Nephrology was consulted and it was brought under control. She was evaluated by the traumatic brain injury therapy team and she made steady progress until she was able to go home. Once her sodium had stabilized she was discharged home in good  condition.   Allergies as of 05/29/2016   No Known Allergies     Medication List    TAKE these medications   calcium carbonate 1250 (500 Ca) MG tablet Commonly known as:  OS-CAL - dosed in mg of elemental calcium Take 1 tablet by mouth daily with breakfast.   cholecalciferol 1000 units tablet Commonly known as:  VITAMIN D Take 1,000 Units by mouth daily.   docusate sodium 100 MG capsule Commonly known as:  COLACE Take 2 capsules (200 mg total) by mouth 2 (two) times daily.   furosemide 20 MG tablet Commonly known as:  LASIX Take 3 tablets (60 mg total) by mouth 2 (two) times daily.   ondansetron 4 MG tablet Commonly known as:  ZOFRAN Take 1 tablet (4 mg total) by mouth every 4 (four) hours as needed for nausea.   polyethylene glycol packet Commonly known as:  MIRALAX / GLYCOLAX Take 17 g by mouth 2 (two) times daily.   traMADol 50 MG tablet Commonly known as:  ULTRAM Take 1-2 tablets (50-100 mg total) by mouth every 6 (six) hours as needed (Pain).       Follow-up Information    CABBELL,KYLE L, MD. Schedule an appointment as soon as possible for a visit.   Specialty:  Neurosurgery Contact information: [address] [place] [address] [phone]        Vivia EwingJustin Drab, DMD. Schedule an appointment as soon as possible for a visit.   Specialty:  Dentistry Contact information: [address] [place] [address] [phone]  Bufford Buttner, MD. Schedule an appointment as soon as possible for a visit.   Specialty:  Nephrology Contact information: [address] [place] [address] [phone]        [facility] TRAUMA SERVICE Follow up.   Why:  Call as needed Contact information: [address] [id number] mc [place] [address] [phone]           Signed: Freeman Caldron, PA-C Pager: [phone] General Trauma PA Pager: [phone] 05/29/2016, 9:29 AM

## 2016-05-29 NOTE — Care Management Note (Signed)
Case Management Note  Patient Details  Name: Annette Kidd MRN: [id number] Date of Birth: 1955/12/11  Subjective/Objective:    Pt medically stable for discharge home today with family.  PT/OT recommending OP follow up.                  Action/Plan: [facility]/OT.  Notified pt of referral, and that rehab center will call her for appointment.  Referral information on AVS in EPIC.    Expected Discharge Date:  05/29/16               Expected Discharge Plan:  Home/Self Care  In-House Referral:     Discharge planning Services  CM Consult  Post Acute Care Choice:    Choice offered to:     DME Arranged:    DME Agency:     HH Arranged:    HH Agency:     Status of Service:  Completed, signed off  If discussed at [employer] Length of Stay Meetings, dates discussed:    Additional Comments:  Quintella BatonJulie W. Luvia Orzechowski, RN, BSN  Trauma/Neuro ICU Case Manager [phone]

## 2016-05-29 NOTE — Progress Notes (Signed)
[facility] Brief Note  Please refer to Dr. Beaulah CorinUpton's note of 1/21 Last serum sodium 131 No labs this AM I note plans for probable discharge Discharge to home on Lasix 60 BID Make certain instructed on 1 liter fluid restriction Will arrange for [facility]  Camille Balynthia Lahela Woodin, MD [facility] [phone] Pager 05/29/2016, 7:30 AM

## 2016-05-29 NOTE — Progress Notes (Signed)
Discussed discharge instructions and medications with patient and family. Both verbalized understanding with all questions answered. VSS. Pt discharged home with family.  Raif Chachere,RN  
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
## 2016-05-29 NOTE — Discharge Instructions (Signed)
Fluid restriction of 1 liter per day

## 2016-05-31 ENCOUNTER — Other Ambulatory Visit: Payer: Self-pay | Admitting: *Deleted

## 2016-05-31 NOTE — Patient Outreach (Signed)
[facility] Care Management  05/31/2016  Annette Kidd July 17, 1955 [id number]   Per [name] at [employer], patient being followed by internal [employer] CM program, and no patient outreach needed by [facility] Care Management at this time.   RNCM will proceed with case closure due to other CM program and send case closure request to Iverson AlaminLaura Greeson at [facility] Care Management.    Dot Splinter H. Gardiner Barefootooper RN, BSN, CCM [facility] Care Management [facility] Telephonic CM Phone: [phone] Fax: [phone]

## 2016-06-06 ENCOUNTER — Ambulatory Visit: Payer: Managed Care, Other (non HMO) | Attending: Orthopedic Surgery | Admitting: Physical Therapy

## 2016-06-06 ENCOUNTER — Ambulatory Visit: Payer: Managed Care, Other (non HMO) | Admitting: Occupational Therapy

## 2016-06-06 ENCOUNTER — Encounter: Payer: Self-pay | Admitting: Physical Therapy

## 2016-06-06 DIAGNOSIS — R262 Difficulty in walking, not elsewhere classified: Secondary | ICD-10-CM | POA: Insufficient documentation

## 2016-06-06 DIAGNOSIS — R2681 Unsteadiness on feet: Secondary | ICD-10-CM | POA: Diagnosis not present

## 2016-06-06 DIAGNOSIS — R41844 Frontal lobe and executive function deficit: Secondary | ICD-10-CM | POA: Diagnosis present

## 2016-06-06 DIAGNOSIS — R42 Dizziness and giddiness: Secondary | ICD-10-CM | POA: Diagnosis present
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
## 2016-06-08 NOTE — Addendum Note (Signed)
Addended by: Willa FraterFREEMAN, ANGELA D on: 06/08/2016 04:44 PM   Modules accepted: Orders

## 2016-06-08 NOTE — Addendum Note (Signed)
Addended by: Willa FraterFREEMAN, Cailynn Bodnar D on: 06/08/2016 04:19 PM   Modules accepted: Orders

## 2016-06-12 ENCOUNTER — Ambulatory Visit: Payer: Managed Care, Other (non HMO) | Attending: Orthopedic Surgery | Admitting: Occupational Therapy

## 2016-06-12 ENCOUNTER — Encounter: Payer: Self-pay | Admitting: Occupational Therapy

## 2016-06-12 DIAGNOSIS — R42 Dizziness and giddiness: Secondary | ICD-10-CM | POA: Diagnosis present

## 2016-06-12 DIAGNOSIS — R262 Difficulty in walking, not elsewhere classified: Secondary | ICD-10-CM | POA: Diagnosis present

## 2016-06-12 DIAGNOSIS — R2681 Unsteadiness on feet: Secondary | ICD-10-CM | POA: Diagnosis not present

## 2016-06-12 DIAGNOSIS — R41844 Frontal lobe and executive function deficit: Secondary | ICD-10-CM

## 2016-06-12 NOTE — Patient Instructions (Signed)
Saccadic and tracking with head still - 5 reps 3-4 times daily
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
## 2016-06-13 ENCOUNTER — Ambulatory Visit: Payer: Managed Care, Other (non HMO) | Admitting: Occupational Therapy

## 2016-06-13 ENCOUNTER — Ambulatory Visit: Payer: Managed Care, Other (non HMO) | Admitting: Physical Therapy

## 2016-06-13 ENCOUNTER — Encounter: Payer: Self-pay | Admitting: Occupational Therapy

## 2016-06-13 DIAGNOSIS — R42 Dizziness and giddiness: Secondary | ICD-10-CM

## 2016-06-13 DIAGNOSIS — R2681 Unsteadiness on feet: Secondary | ICD-10-CM

## 2016-06-13 DIAGNOSIS — R41844 Frontal lobe and executive function deficit: Secondary | ICD-10-CM

## 2016-06-13 DIAGNOSIS — R262 Difficulty in walking, not elsewhere classified: Secondary | ICD-10-CM

## 2016-06-13 NOTE — Therapy (Signed)
[facility] [address] [place], [place], [address] Phone: [phone]   Fax:  [phone]  Physical Therapy Treatment  Patient Details  Name: Annette Kidd MRN: [id number] Date of Birth: 07-Jul-1955 Referring Provider: Freeman Caldron, PA-C  Encounter Date: 06/13/2016      PT End of Session - 06/13/16 1304    Visit Number 2   Number of Visits 16   Date for PT Re-Evaluation 08/05/16   Authorization Type C[employer]-60 visits combined PT/OT   PT Start Time 1145   PT Stop Time 1230   PT Time Calculation (min) 45 min   Equipment Utilized During Treatment Gait belt   Activity Tolerance Patient tolerated treatment well   Behavior During Therapy [facility] for tasks assessed/performed      No past medical history on file.  Past Surgical History:  Procedure Laterality Date  . APPENDECTOMY      There were no vitals filed for this visit.      Subjective Assessment - 06/13/16 1143    Subjective doing well, slowly feels like she's getting better   Limitations Other (comment);Walking;Standing   Patient Stated Goals Walk longer and return to regular activities-yoga, typing, gardening, hiking, fitness when cleared by MD   Currently in Pain? Yes   Pain Score --  did not rate with questioning   Pain Location Back   Pain Orientation Lower   Pain Descriptors / Indicators Aching   Pain Type Acute pain   Pain Onset 1 to 4 weeks ago   Pain Frequency Intermittent   Aggravating Factors  lack of exercise   Pain Relieving Factors stretches            [facility] PT Assessment - 06/13/16 1159      Functional Gait  Assessment   Gait assessed  Yes   Gait Level Surface Walks 20 ft in less than 5.5 sec, no assistive devices, good speed, no evidence for imbalance, normal gait pattern, deviates no more than 6 in outside of the 12 in walkway width.   Change in Gait Speed Able to smoothly change walking speed without loss of balance or gait  deviation. Deviate no more than 6 in outside of the 12 in walkway width.   Gait with Horizontal Head Turns Performs head turns smoothly with slight change in gait velocity (eg, minor disruption to smooth gait path), deviates 6-10 in outside 12 in walkway width, or uses an assistive device.   Gait with Vertical Head Turns Performs task with slight change in gait velocity (eg, minor disruption to smooth gait path), deviates 6 - 10 in outside 12 in walkway width or uses assistive device   Gait and Pivot Turn Pivot turns safely within 3 sec and stops quickly with no loss of balance.   Step Over Obstacle Is able to step over 2 stacked shoe boxes taped together (9 in total height) without changing gait speed. No evidence of imbalance.   Gait with Narrow Base of Support Is able to ambulate for 10 steps heel to toe with no staggering.   Gait with Eyes Closed Walks 20 ft, uses assistive device, slower speed, mild gait deviations, deviates 6-10 in outside 12 in walkway width. Ambulates 20 ft in less than 9 sec but greater than 7 sec.   Ambulating Backwards Walks 20 ft, uses assistive device, slower speed, mild gait deviations, deviates 6-10 in outside 12 in walkway width.   Steps Alternating feet, no rail.   Total Score  26            Vestibular Assessment - 06/13/16 1202      Symptom Behavior   Type of Dizziness Imbalance   Frequency of Dizziness intermittent     Occulomotor Exam   Occulomotor Alignment Normal   Spontaneous Absent   Gaze-induced Absent   Smooth Pursuits Saccades  catch up saccades   Saccades Intact   Comment head impulse negative     Vestibulo-Occular Reflex   VOR 1 Head Only (x 1 viewing) WNL     Positional Testing   Dix-Hallpike Dix-Hallpike Right;Dix-Hallpike Left   Horizontal Canal Testing Horizontal Canal Right;Horizontal Canal Left     Dix-Hallpike Right   Dix-Hallpike Right Duration none   Dix-Hallpike Right Symptoms No nystagmus     Dix-Hallpike Left    Dix-Hallpike Left Duration 10 sec; very slight and slow (difficult to see)   Dix-Hallpike Left Symptoms Upbeat, left rotatory nystagmus     Horizontal Canal Right   Horizontal Canal Right Duration none   Horizontal Canal Right Symptoms Normal     Horizontal Canal Left   Horizontal Canal Left Duration none   Horizontal Canal Left Symptoms Normal                  Vestibular Treatment/Exercise - 06/13/16 1303      Vestibular Treatment/Exercise   Vestibular Treatment Provided Canalith Repositioning   Canalith Repositioning Epley Manuever Left      EPLEY MANUEVER LEFT   Number of Reps  2   Overall Response  Improved Symptoms               PT Education - 06/13/16 1304    Education provided Yes   Education Details BPPV   Person(s) Educated Patient   Methods Explanation;Handout;Demonstration   Comprehension Verbalized understanding          PT Short Term Goals - 06/13/16 1307      PT SHORT TERM GOAL #1   Title  (TARGET DATE FOR ALL STG 07/05/16) pt will be independent with HEP   Status On-going     PT SHORT TERM GOAL #2   Title will demonstrate safe floor <> furniture transfer (for gardening/yoga) independently   Status On-going     PT SHORT TERM GOAL #3   Title will ambulate >1000' over uneven surfaces outdoors with supervision and report symptoms of pain or dizziness as <2/10   Status On-going     PT SHORT TERM GOAL #4   Title Will negotiate 12 stairs alternating sequence, no UE at supervision level   Baseline requires one UE support for 12 stairs or supervision for no UE support   Status On-going     PT SHORT TERM GOAL #5   Title Will complete DGI and LTG set   Baseline FGA performed 06/13/16   Status Achieved           PT Long Term Goals - 06/13/16 1309      PT LONG TERM GOAL #1   Title (TARGET DATE FOR ALL LTG 08/05/16) will report symptoms of pain, dizziness, fatigue of <2 after participating in one full yoga class   Status On-going      PT LONG TERM GOAL #2   Title Will improve functional leg strength as indicated by an improvement in 5 times sit <> stand of =/>5 seconds   Status On-going     PT LONG TERM GOAL #3   Title Pt will demonstrate improved balance and independence  with community gait as indicated by gait velocity increasing to >4 ft/sec   Status On-going     PT LONG TERM GOAL #4   Title Will perform up/down 12 stairs, alternating sequence, no UE at mod I level to negotiate stairs in home safely   Status On-going     PT LONG TERM GOAL #5   Title improve FGA to >/= 28/30 for improved functional mobility   Status New               Plan - 06/13/16 1305    Clinical Impression Statement Pt with ? L pBPPV so treated with 2 reps of epley's today and pt reported mild improvement in symptoms after.  Will plan to monitor and tx PRN.  Difficult to see nystagmus but pt with positive symptoms initially which resolved in < 10 sec.  Pt score 26/30 on FGA so no significant fall risk noted, but feel this is likely decreased from baseline.   PT Treatment/Interventions ADLs/Self Care Home Management;Canalith Repostioning;Moist Heat;Gait training;Stair training;Functional mobility training;Therapeutic activities;Therapeutic exercise;Balance training;Neuromuscular re-education;Cognitive remediation;Patient/family education;Manual techniques;Passive range of motion;Energy conservation;Taping;Vestibular   PT Next Visit Plan initiate HEP, education on energy conservation, reassess vestibular assessment PRN   Consulted and Agree with Plan of Care Patient      Patient will benefit from skilled therapeutic intervention in order to improve the following deficits and impairments:  Decreased activity tolerance, Decreased balance, Decreased cognition, Decreased endurance, Decreased strength, Difficulty walking, Dizziness, Pain  Visit Diagnosis: Unsteadiness on feet  Dizziness and giddiness  Difficulty in walking, not elsewhere  classified     Problem List Patient Active Problem List   Diagnosis Date Noted  . Open nasal fracture 05/29/2016  . Facial laceration 05/29/2016  . Blunt head trauma 05/29/2016  . SIADH (syndrome of inappropriate ADH production) (HCC) 05/29/2016  . TBI (traumatic brain injury) (HCC) 05/17/2016  . Rupture of plantar fascia 08/19/2015  . Degenerative tear of posterior horn of medial meniscus 06/15/2015  . Right hand pain 08/06/2013  . Leg length inequality 09/17/2012  . Pain, joint, ankle and foot 09/26/2011      Clarita Crane, PT, DPT 06/13/16 1:11 PM    [facility] [address] [place], [place], [address] Phone: [phone]   Fax:  [phone]  Name: Annette Kidd MRN: [id number] Date of Birth: [birth year]
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
## 2016-06-16 ENCOUNTER — Ambulatory Visit: Payer: Managed Care, Other (non HMO) | Admitting: Physical Therapy

## 2016-06-16 ENCOUNTER — Encounter: Payer: Self-pay | Admitting: Physical Therapy

## 2016-06-16 DIAGNOSIS — R2681 Unsteadiness on feet: Secondary | ICD-10-CM | POA: Diagnosis not present

## 2016-06-16 DIAGNOSIS — R42 Dizziness and giddiness: Secondary | ICD-10-CM

## 2016-06-16 DIAGNOSIS — R262 Difficulty in walking, not elsewhere classified: Secondary | ICD-10-CM

## 2016-06-16 NOTE — Therapy (Signed)
[facility] [address] [place], [place], [address] Phone: [phone]   Fax:  [phone]  Physical Therapy Treatment  Patient Details  Name: Annette Kidd MRN: [id number] Date of Birth: [birth year] Referring Provider: Freeman CaldronMichael J Jeffery, PA-C  Encounter Date: 06/16/2016      PT End of Session - 06/16/16 1312    Visit Number 3   Number of Visits 16   Date for PT Re-Evaluation 08/05/16   Authorization Type [employer]-60 visits combined PT/OT   PT Start Time 1105   PT Stop Time 1151   PT Time Calculation (min) 46 min   Activity Tolerance Patient tolerated treatment well   Behavior During Therapy [facility] for tasks assessed/performed      History reviewed. No pertinent past medical history.  Past Surgical History:  Procedure Laterality Date  . APPENDECTOMY      There were no vitals filed for this visit.      Subjective Assessment - 06/16/16 1111    Subjective Pt is very impressed with visual and vestibular exercises; reporting some soreness in L lower back.  Asking questions about taking rest breaks during the day. Is still walking each day.   Patient is accompained by: Family member   Limitations Other (comment);Walking;Standing   Patient Stated Goals Walk longer and return to regular activities-yoga, typing, gardening, hiking, fitness when cleared by MD   Pain Score 1    Pain Location Back   Pain Orientation Left   Pain Descriptors / Indicators Sore   Pain Type Acute pain           Vestibular Assessment - 06/16/16 1130      Positional Testing   Dix-Hallpike Dix-Hallpike Left     Dix-Hallpike Left   Dix-Hallpike Left Duration >30 seconds   Dix-Hallpike Left Symptoms Upbeat, left rotatory nystagmus           Vestibular Treatment/Exercise - 06/16/16 0001      Vestibular Treatment/Exercise   Habituation Exercises Gari CrownBrandt Daroff     Brandt Daroff   Number of Reps  2   Symptom Description   Intermittent vertigo-3-5 seconds in duration-up from L and down to R            Balance Exercises - 06/16/16 1325      Balance Exercises: Standing   Standing Eyes Opened Narrow base of support (BOS);Solid surface;1 rep;Head turns  vertical and horizontal head turns, 10 reps each   Standing Eyes Closed Narrow base of support (BOS);Head turns;Solid surface;1 rep  vertical and horizontal head turns, 10 reps each    Also performed Lumbar and LE stretches: lateral side bending, hip flexor and hamstring stretches on mat-R and L side x 30 seconds each incorporating yoga positions into stretches with min HHA for balance during runner's lunge.       PT Education - 06/16/16 1312    Education provided Yes   Education Details Cupulolithiasis, habituation treatment, HEP   Person(s) Educated Patient   Methods Explanation;Demonstration;Handout   Comprehension Verbalized understanding;Returned demonstration          PT Short Term Goals - 06/13/16 1307      PT SHORT TERM GOAL #1   Title  (TARGET DATE FOR ALL STG 07/05/16) pt will be independent with HEP   Status On-going     PT SHORT TERM GOAL #2   Title will demonstrate safe floor <> furniture transfer (for gardening/yoga) independently   Status On-going     PT SHORT  TERM GOAL #3   Title will ambulate >1000' over uneven surfaces outdoors with supervision and report symptoms of pain or dizziness as <2/10   Status On-going     PT SHORT TERM GOAL #4   Title Will negotiate 12 stairs alternating sequence, no UE at supervision level   Baseline requires one UE support for 12 stairs or supervision for no UE support   Status On-going     PT SHORT TERM GOAL #5   Title Will complete DGI and LTG set   Baseline FGA performed 06/13/16   Status Achieved           PT Long Term Goals - 06/13/16 1309      PT LONG TERM GOAL #1   Title (TARGET DATE FOR ALL LTG 08/05/16) will report symptoms of pain, dizziness, fatigue of <2 after  participating in one full yoga class   Status On-going     PT LONG TERM GOAL #2   Title Will improve functional leg strength as indicated by an improvement in 5 times sit <> stand of =/>5 seconds   Status On-going     PT LONG TERM GOAL #3   Title Pt will demonstrate improved balance and independence with community gait as indicated by gait velocity increasing to >4 ft/sec   Status On-going     PT LONG TERM GOAL #4   Title Will perform up/down 12 stairs, alternating sequence, no UE at mod I level to negotiate stairs in home safely   Status On-going     PT LONG TERM GOAL #5   Title improve FGA to >/= 28/30 for improved functional mobility   Status New               Plan - 06/16/16 1313    Clinical Impression Statement Re-assessment of peripheral vestibular system revealed continued upbeating, rotary nystagmus and prolonged symptoms of vertigo.  Suspect cupulolithiasis but due to facial fractures, unable to perform Liberatory maneuver today.  Provided pt with Brandt-Daroff for self treatment at home.  Also provided pt with ROM and balance exercises for HEP; pt tolerated well with minimal increase in symptoms.  Will continue to assess and address vestibular dysfunction.   PT Treatment/Interventions ADLs/Self Care Home Management;Canalith Repostioning;Moist Heat;Gait training;Stair training;Functional mobility training;Therapeutic activities;Therapeutic exercise;Balance training;Neuromuscular re-education;Cognitive remediation;Patient/family education;Manual techniques;Passive range of motion;Energy conservation;Taping;Vestibular   PT Next Visit Plan Check all canals, continue balance training incorporating yoga, more corner exercises, treadmill    Consulted and Agree with Plan of Care Patient;Family member/caregiver   Family Member Consulted sister      Patient will benefit from skilled therapeutic intervention in order to improve the following deficits and impairments:  Decreased  activity tolerance, Decreased balance, Decreased cognition, Decreased endurance, Decreased strength, Difficulty walking, Dizziness, Pain  Visit Diagnosis: Unsteadiness on feet  Dizziness and giddiness  Difficulty in walking, not elsewhere classified     Problem List Patient Active Problem List   Diagnosis Date Noted  . Open nasal fracture 05/29/2016  . Facial laceration 05/29/2016  . Blunt head trauma 05/29/2016  . SIADH (syndrome of inappropriate ADH production) (HCC) 05/29/2016  . TBI (traumatic brain injury) (HCC) 05/17/2016  . Rupture of plantar fascia 08/19/2015  . Degenerative tear of posterior horn of medial meniscus 06/15/2015  . Right hand pain 08/06/2013  . Leg length inequality 09/17/2012  . Pain, joint, ankle and foot 09/26/2011    Edman Circle, PT, DPT 06/16/16    1:31 PM    [facility] [address] [place], [place], [address] Phone: [phone]   Fax:  [phone]  Name: Annette Kidd MRN: [id number] Date of Birth: 1955-12-08

## 2016-06-16 NOTE — Patient Instructions (Signed)
Habituation - Tip Card  1.The goal of habituation training is to assist in decreasing symptoms of vertigo, dizziness, or nausea provoked by specific head and body motions. 2.These exercises may initially increase symptoms; however, be persistent and work through symptoms. With repetition and time, the exercises will assist in reducing or eliminating symptoms. 3.Exercises should be stopped and discussed with the therapist if you experience any of the following: - Sudden change or fluctuation in hearing - New onset of ringing in the ears, or increase in current intensity - Any fluid discharge from the ear - Severe pain in neck or back - Extreme nausea  Habituation - Sit to Side-Lying   Sit on edge of bed. Lie down onto the right side and hold until dizziness stops, plus 20 seconds.  Return to sitting and wait until dizziness stops, plus 20 seconds.  Repeat to the left side. Repeat sequence 5 times per session. Do 2 sessions per day.  Copyright  VHI. All rights reserved.   Iliotibial Band (Stand)    Stand with feet crossed. Tilt left shoulder toward floor. Do not rotate trunk, only tilt. Keep feet flat on floor. Hold __30__ seconds each side. Repeat _2___ times. Do _2___ sessions per day. CAUTION: Stretch should be gentle, steady and slow.  Copyright  VHI. All rights reserved.  Flexors, Kneeling    Kneel on one leg. Slowly push pelvis down while slightly arching back until stretch is felt on front of hip. Hold _30__ seconds.   Straighten front leg, keep back straight and lean over front leg x 30 seconds; repeat on other side Repeat _2__ times per session. Do _2__ sessions per day.  Copyright  VHI. All rights reserved.       Feet Together, Head Motion - Eyes Open    With eyes open, feet together, move head slowly: up and down 10 times, rest and then side to side 10 times. Close eyes, feet together, move head slowly up and down 10 times, rest, side to side 10 times. Have chair  in front of you for balance if needed. Do 2 sessions per day.  Copyright  VHI. All rights reserved.

## 2016-06-19 ENCOUNTER — Encounter: Payer: Self-pay | Admitting: Physical Therapy

## 2016-06-19 ENCOUNTER — Ambulatory Visit: Payer: Managed Care, Other (non HMO) | Admitting: Physical Therapy

## 2016-06-19 DIAGNOSIS — R2681 Unsteadiness on feet: Secondary | ICD-10-CM | POA: Diagnosis not present

## 2016-06-19 DIAGNOSIS — R262 Difficulty in walking, not elsewhere classified: Secondary | ICD-10-CM

## 2016-06-19 DIAGNOSIS — R42 Dizziness and giddiness: Secondary | ICD-10-CM
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
## 2016-06-19 NOTE — Patient Instructions (Signed)
Gaze Stabilization - Tip Card  1.Target must remain in focus, not blurry, and appear stationary while head is in motion. 2.Perform exercises with small head movements (45 to either side of midline). 3.Increase speed of head motion so long as target is in focus. 4.If you wear eyeglasses, be sure you can see target through lens (therapist will give specific instructions for bifocal / progressive lenses). 5.These exercises may provoke dizziness or nausea. Work through these symptoms. If too dizzy, slow head movement slightly. Rest between each exercise. 6.Exercises demand concentration; avoid distractions. 7.For safety, perform standing exercises close to a counter, wall, corner, or next to someone.  Copyright  VHI. All rights reserved.   Gaze Stabilization - Standing Feet Apart   Feet shoulder width apart, keeping eyes on target on wall 3 feet away, tilt head down slightly and move head side to side for 30 seconds. Repeat while moving head up and down for 30 seconds. *Work up to tolerating 60 seconds, as able. Do 2-3 sessions per day.   Copyright  VHI. All rights reserved.   Special Instructions: Exercises may bring on mild to moderate symptoms of 3-4 that resolve within 30 minutes of completing exercises. If symptoms are lasting longer than 30 minutes, modify your exercises by:  >decreasing the # of times you complete each activity >ensuring your symptoms return to baseline before moving onto the next exercise >dividing up exercises so you do not do them all in one session, but multiple short sessions throughout the day >doing them once a day until symptoms improve

## 2016-06-20 ENCOUNTER — Encounter: Payer: Self-pay | Admitting: Occupational Therapy

## 2016-06-20 ENCOUNTER — Ambulatory Visit: Payer: Managed Care, Other (non HMO) | Admitting: Occupational Therapy

## 2016-06-20 DIAGNOSIS — R2681 Unsteadiness on feet: Secondary | ICD-10-CM | POA: Diagnosis not present

## 2016-06-20 DIAGNOSIS — R41844 Frontal lobe and executive function deficit: Secondary | ICD-10-CM
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
## 2016-06-20 NOTE — Patient Instructions (Signed)
Local Driver Evaluation Programs: ° °Comprehensive Evaluation: includes clinical and in vehicle behind the wheel testing by OCCUPATIONAL THERAPIST. Programs have varying levels of adaptive controls available for trial.  ° °[facility] °[address] °[place], [place]  [address] °[phone] or [phone] °http://www.driver-rehab.com °Evaluator:  Cyndee Crompton, OT/CDRS/CDI/SCDCM/Low Vision Certification ° °[facility] °[address] °[place], [place] [address] °[phone] °https://www.[facility].org/home/services/rehabilitation.aspx °Evaluators:  Shannon Sheek, OT and Jill Tucker, OT ° °[facility] [place] [facility] (ONLY SERVES VETERANS!!) °[facility] °[address] °[place], [place]  [address] °[phone] [phone] °http://www.[place].va.gov/services/Physical_Medicine_Rehabilitation_Services.asp °Evaluators:  Eric Andrews, KT; Heidi Harris, KT;  Gary Whitaker, KT (KT=kiniesotherapist) ° ° °Clinical evaluations only:  Includes clinical testing, refers to other programs or local certified driving instructor for behind the wheel testing. ° °[facility] (outpatient Rehab) °[facility] °[address] °[place], [place] [address] °[phone] for scheduling °http://www.[facility].edu/Outpatient-Rehabilitation/Neurorehabilitation-Therapy.htm °Evaluators:  Kelly Lambeth, OT; Kate Phillips, OT ° °Other area clinical evaluators available upon request including [facility], [facility] and [facility]. ° ° °    Resource List °What is a Driver Evaluation: °Your Road Ahead - A Guide to Comprehensive Driving Evaluations °http://www.thehartford.com/resources/mature-market-excellence/publications-on-aging ° °Association for [employer] Rehabilitation Services - Disability and Driving Fact Sheets °http://www.aded.net/?page=510 ° °Driving after a Brain  Injury: °B[employer] of [employer] °http://www.biausa.org/tbims-abstracts/if-there-is-an-effective-way-to-determine-if-someone-is-ready-to-drive-after-tbi?A=SearchResult&SearchID=9495675&ObjectID=2758842&ObjectType=35 ° °Driving with Adaptive Equipment: °[facility] Process °http://www.driver-rehab.com/adaptive-equipment ° °National Mobility Equipment [employer] Association °http://www.nmeda.com/ ° ° ° ° ° ° °  °

## 2016-06-22 ENCOUNTER — Ambulatory Visit: Payer: Managed Care, Other (non HMO) | Admitting: Physical Therapy

## 2016-06-22 ENCOUNTER — Encounter: Payer: Self-pay | Admitting: Physical Therapy

## 2016-06-22 ENCOUNTER — Encounter: Payer: Self-pay | Admitting: Occupational Therapy

## 2016-06-22 ENCOUNTER — Ambulatory Visit: Payer: Managed Care, Other (non HMO) | Admitting: Occupational Therapy

## 2016-06-22 DIAGNOSIS — R2681 Unsteadiness on feet: Secondary | ICD-10-CM

## 2016-06-22 DIAGNOSIS — R42 Dizziness and giddiness: Secondary | ICD-10-CM

## 2016-06-22 DIAGNOSIS — R262 Difficulty in walking, not elsewhere classified: Secondary | ICD-10-CM

## 2016-06-22 DIAGNOSIS — R41844 Frontal lobe and executive function deficit: Secondary | ICD-10-CM
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
## 2016-06-22 NOTE — Therapy (Signed)
[facility] [address] [place], [place], [address] Phone: [phone]   Fax:  [phone]  Physical Therapy Treatment  Patient Details  Name: Annette Kidd MRN: [id number] Date of Birth: 10-16-1955 Referring Provider: Freeman Caldron, PA-C  Encounter Date: 06/22/2016      PT End of Session - 06/22/16 1245    Visit Number 5   Number of Visits 16   Date for PT Re-Evaluation 08/05/16   Authorization Type [employer]-60 visits combined PT/OT   PT Start Time 1100   PT Stop Time 1145   PT Time Calculation (min) 45 min   Activity Tolerance Patient tolerated treatment well   Behavior During Therapy [facility] for tasks assessed/performed      History reviewed. No pertinent past medical history.  Past Surgical History:  Procedure Laterality Date  . APPENDECTOMY      There were no vitals filed for this visit.      Subjective Assessment - 06/22/16 1110    Subjective Pt continues to do well, continues to report increased dizziness after performing exercises   Limitations Other (comment);Walking;Standing   Patient Stated Goals Walk longer and return to regular activities-yoga, typing, gardening, hiking, fitness when cleared by MD   Currently in Pain? No/denies           [facility] Adult PT Treatment/Exercise - 06/22/16 1217      Self-Care   Self-Care Lifting;Other Self-Care Comments   Lifting Lifting 10 and then 20lbs in crate with bilat UE and performing gait x 115' x 2 over compliant surfaces, up/down curb and ramp, vertical and horizontal head turns, retro stepping, and 180 deg changes in direction with supervision.  Also performed lifting 10lb from floor with wide BOS and side stepping L and R with verbal and visual cues for core activation      Neuro Re-ed    Neuro Re-ed Details  Performed NMR on floor for balance, core stability during dynamic UE and eye movements: tall kneeling squats forwards and side to side with UE  flexion, PNF and circular patterns with eyes following UE in various directions; also performed alternating tall kneeling to half kneeling on compliant surface                PT Education - 06/22/16 1244    Education provided Yes   Education Details symptom management, indication of improvement, peripheral vestibular impairments vs. central motion sensitivity   Person(s) Educated Patient   Methods Explanation   Comprehension Verbalized understanding          PT Short Term Goals - 06/13/16 1307      PT SHORT TERM GOAL #1   Title  (TARGET DATE FOR ALL STG 07/05/16) pt will be independent with HEP   Status On-going     PT SHORT TERM GOAL #2   Title will demonstrate safe floor <> furniture transfer (for gardening/yoga) independently   Status On-going     PT SHORT TERM GOAL #3   Title will ambulate >1000' over uneven surfaces outdoors with supervision and report symptoms of pain or dizziness as <2/10   Status On-going     PT SHORT TERM GOAL #4   Title Will negotiate 12 stairs alternating sequence, no UE at supervision level   Baseline requires one UE support for 12 stairs or supervision for no UE support   Status On-going     PT SHORT TERM GOAL #5   Title Will complete DGI and LTG set  Baseline FGA performed 06/13/16   Status Achieved           PT Long Term Goals - 06/13/16 1309      PT LONG TERM GOAL #1   Title (TARGET DATE FOR ALL LTG 08/05/16) will report symptoms of pain, dizziness, fatigue of <2 after participating in one full yoga class   Status On-going     PT LONG TERM GOAL #2   Title Will improve functional leg strength as indicated by an improvement in 5 times sit <> stand of =/>5 seconds   Status On-going     PT LONG TERM GOAL #3   Title Pt will demonstrate improved balance and independence with community gait as indicated by gait velocity increasing to >4 ft/sec   Status On-going     PT LONG TERM GOAL #4   Title Will perform up/down 12 stairs,  alternating sequence, no UE at mod I level to negotiate stairs in home safely   Status On-going     PT LONG TERM GOAL #5   Title improve FGA to >/= 28/30 for improved functional mobility   Status New               Plan - 06/22/16 1247    Clinical Impression Statement Pt is making improvement with frequency and intensity of symptoms and symptom self-management; still reporting dizziness with exercises-appears to be more consistent with motion sensitivity vs. BPPV-pt advised to continue with habituation exercises.  Pt able to tolerate intiation of work simulated activities today.  Continued to encourage pt to participate in community exercise-gentle yoga-pt to consider.     Rehab Potential Good   PT Treatment/Interventions ADLs/Self Care Home Management;Canalith Repostioning;Moist Heat;Gait training;Stair training;Functional mobility training;Therapeutic activities;Therapeutic exercise;Balance training;Neuromuscular re-education;Cognitive remediation;Patient/family education;Manual techniques;Passive range of motion;Energy conservation;Taping;Vestibular   PT Next Visit Plan Review x 1 viewing; progress corner balance exercises, simulate work activities, treadmill with head turns, gait outside if weather allows   Consulted and Agree with Plan of Care Patient      Patient will benefit from skilled therapeutic intervention in order to improve the following deficits and impairments:  Decreased activity tolerance, Decreased balance, Decreased cognition, Decreased endurance, Decreased strength, Difficulty walking, Dizziness, Pain  Visit Diagnosis: Unsteadiness on feet  Dizziness and giddiness  Difficulty in walking, not elsewhere classified     Problem List Patient Active Problem List   Diagnosis Date Noted  . Open nasal fracture 05/29/2016  . Facial laceration 05/29/2016  . Blunt head trauma 05/29/2016  . SIADH (syndrome of inappropriate ADH production) (HCC) 05/29/2016  . TBI  (traumatic brain injury) (HCC) 05/17/2016  . Rupture of plantar fascia 08/19/2015  . Degenerative tear of posterior horn of medial meniscus 06/15/2015  . Right hand pain 08/06/2013  . Leg length inequality 09/17/2012  . Pain, joint, ankle and foot 09/26/2011    Edman CircleAudra Hall, PT, DPT 06/22/16    1:07 PM   [facility] [address] [place], [place], [address] Phone: [phone]   Fax:  [phone]  Name: Sherrilee GillesLee P Skalsky MRN: [id number] Date of Birth: [birth year]

## 2016-06-26 ENCOUNTER — Encounter: Payer: Self-pay | Admitting: Occupational Therapy

## 2016-06-26 ENCOUNTER — Ambulatory Visit: Payer: Managed Care, Other (non HMO) | Admitting: Occupational Therapy

## 2016-06-26 ENCOUNTER — Encounter: Payer: Self-pay | Admitting: Physical Therapy

## 2016-06-26 ENCOUNTER — Ambulatory Visit: Payer: Managed Care, Other (non HMO) | Admitting: Physical Therapy

## 2016-06-26 DIAGNOSIS — R2681 Unsteadiness on feet: Secondary | ICD-10-CM | POA: Diagnosis not present

## 2016-06-26 DIAGNOSIS — R41844 Frontal lobe and executive function deficit: Secondary | ICD-10-CM

## 2016-06-26 DIAGNOSIS — R42 Dizziness and giddiness: Secondary | ICD-10-CM

## 2016-06-26 DIAGNOSIS — R262 Difficulty in walking, not elsewhere classified: Secondary | ICD-10-CM
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
## 2016-06-26 NOTE — Therapy (Signed)
[facility] [address] [place], [place], [address] Phone: [phone]   Fax:  [phone]  Physical Therapy Treatment  Patient Details  Name: Annette Kidd MRN: [id number] Date of Birth: [birth year] Referring Provider: Freeman Caldron, PA-C  Encounter Date: 06/26/2016      PT End of Session - 06/26/16 1330    Visit Number 6   Number of Visits 16   Date for PT Re-Evaluation 08/05/16   Authorization Type [employer]-60 visits combined PT/OT   PT Start Time 1240   PT Stop Time 1320   PT Time Calculation (min) 40 min   Activity Tolerance Patient tolerated treatment well   Behavior During Therapy [facility] for tasks assessed/performed      History reviewed. No pertinent past medical history.  Past Surgical History:  Procedure Laterality Date  . APPENDECTOMY      There were no vitals filed for this visit.      Subjective Assessment - 06/26/16 1241    Subjective Had a busy day yesterday and was fatigued; doing well today.  Back is feeling better but dizziness is still problematic; pt still concerned about back pain.  Encouraged pt to discuss with PCP at future visit.   Limitations Other (comment);Walking;Standing   Patient Stated Goals Walk longer and return to regular activities-yoga, typing, gardening, hiking, fitness when cleared by MD   Currently in Pain? No/denies          Vestibular Assessment - 06/26/16 1328      Positional Testing   Dix-Hallpike Dix-Hallpike Right;Dix-Hallpike Left     Dix-Hallpike Right   Dix-Hallpike Right Duration 20 seconds   Dix-Hallpike Right Symptoms Upbeat, right rotatory nystagmus     Dix-Hallpike Left   Dix-Hallpike Left Duration 0   Dix-Hallpike Left Symptoms No nystagmus           Vestibular Treatment/Exercise - 06/26/16 1259      Vestibular Treatment/Exercise   Vestibular Treatment Provided Gaze;Canalith Repositioning   Canalith Repositioning Epley Manuever Right    Gaze Exercises X1 Viewing Horizontal;X1 Viewing Vertical;Eye/Head Exercise Horizontal;Eye/Head Exercise Vertical      EPLEY MANUEVER RIGHT   Number of Reps  2   Overall Response Improved Symptoms     X1 Viewing Horizontal   Foot Position feet together, R and L tandem   Reps 3   Comments 60 sec feet together, 30 sec tandem, mild increase in symptoms     X1 Viewing Vertical   Foot Position feet together, R and L tandem   Reps 3   Comments 60 sec feet together, 30 sec tandem; mild increase in symptoms     Eye/Head Exercise Horizontal   Foot Position walking forwards and retro   Reps 3   Comments eyes and head following ball side to side     Eye/Head Exercise Vertical   Foot Position walking forwards and retro   Reps 3   Comments eyes and head following ball up/down  Also performed R and L diagonals           PT Education - 06/26/16 1330    Education provided Yes   Education Details multi-canal BPPV   Person(s) Educated Patient   Methods Explanation   Comprehension Verbalized understanding          PT Short Term Goals - 06/13/16 1307      PT SHORT TERM GOAL #1   Title  (TARGET DATE FOR ALL STG 07/05/16) pt will be independent with HEP  Status On-going     PT SHORT TERM GOAL #2   Title will demonstrate safe floor <> furniture transfer (for gardening/yoga) independently   Status On-going     PT SHORT TERM GOAL #3   Title will ambulate >1000' over uneven surfaces outdoors with supervision and report symptoms of pain or dizziness as <2/10   Status On-going     PT SHORT TERM GOAL #4   Title Will negotiate 12 stairs alternating sequence, no UE at supervision level   Baseline requires one UE support for 12 stairs or supervision for no UE support   Status On-going     PT SHORT TERM GOAL #5   Title Will complete DGI and LTG set   Baseline FGA performed 06/13/16   Status Achieved           PT Long Term Goals - 06/26/16 0955      PT LONG TERM GOAL #1    Title (TARGET DATE FOR ALL LTG 08/05/16) will report symptoms of pain, dizziness, fatigue of <2 after participating in one full yoga class   Status On-going     PT LONG TERM GOAL #2   Title Will improve functional leg strength as indicated by a decrease in 5 times sit <> stand time by 4 seconds. (6 seconds)   Baseline 10 sec   Status On-going     PT LONG TERM GOAL #3   Title Pt will demonstrate improved balance and independence with community gait as indicated by gait velocity increasing to >4 ft/sec   Baseline 3.28 ft/sec   Status On-going     PT LONG TERM GOAL #4   Title Will perform up/down 12 stairs, alternating sequence, no UE at mod I level to negotiate stairs in home safely   Status On-going     PT LONG TERM GOAL #5   Title improve FGA to >/= 28/30 for improved functional mobility   Status New               Plan - 06/26/16 1331    Clinical Impression Statement Focus of treatment session today on progressing x 1 viewing and eye-head movement exercises with more narrow BOS and more dynamic movements.  Despite performing habituation, Brandt-Daroff- x 3 weeks pt still c/o significant dizziness with supine > sit.  Performed re-assessment of posterior and anterior canals, L and R with R BPPV noted and treated with improvement in symptoms.  Pt does appear to be dealing with a bilateral, multi-canal BPPV-will continue to assess and treat PRN.     Rehab Potential Good   PT Treatment/Interventions ADLs/Self Care Home Management;Canalith Repostioning;Moist Heat;Gait training;Stair training;Functional mobility training;Therapeutic activities;Therapeutic exercise;Balance training;Neuromuscular re-education;Cognitive remediation;Patient/family education;Manual techniques;Passive range of motion;Energy conservation;Taping;Vestibular   PT Next Visit Plan Re-assessment of all canals-treat PRN; progress corner balance exercises, simulate work activities, treadmill with head turns, gait outside  if weather allows   Consulted and Agree with Plan of Care Patient      Patient will benefit from skilled therapeutic intervention in order to improve the following deficits and impairments:  Decreased activity tolerance, Decreased balance, Decreased cognition, Decreased endurance, Decreased strength, Difficulty walking, Dizziness, Pain  Visit Diagnosis: Unsteadiness on feet  Dizziness and giddiness  Difficulty in walking, not elsewhere classified     Problem List Patient Active Problem List   Diagnosis Date Noted  . Open nasal fracture 05/29/2016  . Facial laceration 05/29/2016  . Blunt head trauma 05/29/2016  . SIADH (syndrome of inappropriate ADH production) ([facility])  05/29/2016  . TBI (traumatic brain injury) ([facility]) 05/17/2016  . Rupture of plantar fascia 08/19/2015  . Degenerative tear of posterior horn of medial meniscus 06/15/2015  . Right hand pain 08/06/2013  . Leg length inequality 09/17/2012  . Pain, joint, ankle and foot 09/26/2011   Edman Circle, PT, DPT 06/26/16    1:38 PM   [facility] [address] [place], [place], [address] Phone: [phone]   Fax:  [phone]  Name: Annette Kidd MRN: [id number] Date of Birth: [birth year]

## 2016-06-29 ENCOUNTER — Ambulatory Visit: Payer: Managed Care, Other (non HMO) | Admitting: Rehabilitative and Restorative Service Providers"

## 2016-06-29 ENCOUNTER — Encounter: Payer: Managed Care, Other (non HMO) | Admitting: Occupational Therapy

## 2016-06-29 DIAGNOSIS — R2681 Unsteadiness on feet: Secondary | ICD-10-CM | POA: Diagnosis not present

## 2016-06-29 DIAGNOSIS — R42 Dizziness and giddiness: Secondary | ICD-10-CM

## 2016-06-29 NOTE — Therapy (Signed)
[facility] [address] [place], [place], [address] Phone: [phone]   Fax:  [phone]  Physical Therapy Treatment  Patient Details  Name: Annette Kidd MRN: [id number] Date of Birth: [birth year] Referring Provider: Freeman CaldronMichael J Jeffery, PA-C  Encounter Date: 06/29/2016      PT End of Session - 06/29/16 1557    Visit Number 7   Number of Visits 16   Date for PT Re-Evaluation 08/05/16   Authorization Type [employer]-60 visits combined PT/OT   PT Start Time 0932   PT Stop Time 1020   PT Time Calculation (min) 48 min   Activity Tolerance Patient tolerated treatment well   [facility] for tasks assessed/performed      No past medical history on file.  Past Surgical History:  Procedure Laterality Date  . APPENDECTOMY      There were no vitals filed for this visit.      Subjective Assessment - 06/29/16 0936    Subjective The patient reports that she has a general baseline level of dizziness that is "not normal" and worse in the morning.  She feels afraid that something more serious is wrong with her brain.   The main thing that bothers her is this constant level of dizziness.     Pertinent History *patient reports that she had an episode of 8/10 spinning dizziness approximately 15 years ago due to bacterial infection.     Patient Stated Goals Walk longer and return to regular activities-yoga, typing, gardening, hiking, fitness when cleared by MD   Currently in Pain? No/denies                Vestibular Assessment - 06/29/16 0940      Vestibular Assessment   General Observation Patient ambulates into clinic independently without a device.  She notes her vision is clear, but when she shifts focus, she feels wobbly.       Positional Testing   Sidelying Test Sidelying Right;Sidelying Left   Horizontal Canal Testing Horizontal Canal Right;Horizontal Canal Left     Sidelying Right   Sidelying  Right Duration 20 seconds   Sidelying Right Symptoms No nystagmus  viewed in room light     Sidelying Left   Sidelying Left Duration 10 seconds noting a sensation in the top of her head.  Symptoms occur worse when returning to sitting.     Sidelying Left Symptoms No nystagmus     Horizontal Canal Right   Horizontal Canal Right Duration none   Horizontal Canal Right Symptoms Normal     Horizontal Canal Left   Horizontal Canal Left Duration mild sensation of dizziness, clears quickly   Horizontal Canal Left Symptoms Normal                 [facility] Adult PT Treatment/Exercise - 06/29/16 1600      Self-Care   Self-Care Other Self-Care Comments   Other Self-Care Comments  Answered patient's questions/concerns regarding dizziness and sensation in her head.  Discussed use of habituation (brandt daroff) specifically to treat BPPV (appears mild at this time with only trace symptoms), and also as means to habituate motion sensitivity.  Added HEP discussing symptom mgmt.     Neuro Re-ed    Neuro Re-ed Details  Corner standing activities performing feet together + EC + head motion horizontal x 5 reps, then 10 reps and also vertical x 10 reps with supervision.  Performed compliant surface standing feet apart + head motion  horiozntal and vertical plane x 10 reps near support used when indicated.  Foam standing with eyes closed x 30 second intervals.           Vestibular Treatment/Exercise - 06/29/16 0955      Vestibular Treatment/Exercise   Vestibular Treatment Provided Habituation;Gaze   Canalith Repositioning --   Habituation Exercises Francee Piccolo Daroff;Horizontal Roll   Gaze Exercises X1 Viewing Horizontal;X1 Viewing Vertical     Austin Miles   Number of Reps  3   Symptom Description  Improved symptoms with repetition     Horizontal Roll   Number of Reps  3   Symptom Description  Mild symptoms provoked-- provided for HEP due to report of avoiding rolling in morning due to symptoms      X1 Viewing Horizontal   Foot Position standing feet apart near support surface   Comments 30 seconds emphasizing need for consistent ROM/same amplitude, reduced movement of shoulder and speed x 3 reps     X1 Viewing Vertical   Foot Position standing feet apart   Comments emphasizing speed of movement x 30 seconds x 1 rep               PT Education - 06/29/16 1556    Education provided Yes   Education Details HEP: restarted habituation (brandt daroff and horizontal roll), VOR x 1 viewing standing, feet together + EC + head motion, feet apart + compliant + head motion   Person(s) Educated Patient   Methods Explanation;Demonstration;Handout   Comprehension Verbalized understanding;Returned demonstration          PT Short Term Goals - 06/13/16 1307      PT SHORT TERM GOAL #1   Title  (TARGET DATE FOR ALL STG 07/05/16) pt will be independent with HEP   Status On-going     PT SHORT TERM GOAL #2   Title will demonstrate safe floor <> furniture transfer (for gardening/yoga) independently   Status On-going     PT SHORT TERM GOAL #3   Title will ambulate >1000' over uneven surfaces outdoors with supervision and report symptoms of pain or dizziness as <2/10   Status On-going     PT SHORT TERM GOAL #4   Title Will negotiate 12 stairs alternating sequence, no UE at supervision level   Baseline requires one UE support for 12 stairs or supervision for no UE support   Status On-going     PT SHORT TERM GOAL #5   Title Will complete DGI and LTG set   Baseline FGA performed 06/13/16   Status Achieved           PT Long Term Goals - 06/26/16 0955      PT LONG TERM GOAL #1   Title (TARGET DATE FOR ALL LTG 08/05/16) will report symptoms of pain, dizziness, fatigue of <2 after participating in one full yoga class   Status On-going     PT LONG TERM GOAL #2   Title Will improve functional leg strength as indicated by a decrease in 5 times sit <> stand time by 4 seconds. (6  seconds)   Baseline 10 sec   Status On-going     PT LONG TERM GOAL #3   Title Pt will demonstrate improved balance and independence with community gait as indicated by gait velocity increasing to >4 ft/sec   Baseline 3.28 ft/sec   Status On-going     PT LONG TERM GOAL #4   Title Will perform up/down 12 stairs, alternating sequence,  no UE at mod I level to negotiate stairs in home safely   Status On-going     PT LONG TERM GOAL #5   Title improve FGA to >/= 28/30 for improved functional mobility   Status New               Plan - 06/29/16 1558    Clinical Impression Statement The patient appears to have minimal BPPV remaining, however continues with general motion sensitivity.  PT emphasized technique with VOR for home and added progression of balance + habituation activities.  She is motivated to return to prior activities including gardening, walking, and exercise.    PT Treatment/Interventions ADLs/Self Care Home Management;Canalith Repostioning;Moist Heat;Gait training;Stair training;Functional mobility training;Therapeutic activities;Therapeutic exercise;Balance training;Neuromuscular re-education;Cognitive remediation;Patient/family education;Manual techniques;Passive range of motion;Energy conservation;Taping;Vestibular   PT Next Visit Plan Re-assessment of all canals-treat PRN; progress corner balance exercises, simulate work activities, treadmill with head turns, gait outside if weather allows   Consulted and Agree with Plan of Care Patient      Patient will benefit from skilled therapeutic intervention in order to improve the following deficits and impairments:  Decreased activity tolerance, Decreased balance, Decreased cognition, Decreased endurance, Decreased strength, Difficulty walking, Dizziness, Pain  Visit Diagnosis: Unsteadiness on feet  Dizziness and giddiness     Problem List Patient Active Problem List   Diagnosis Date Noted  . Open nasal fracture  05/29/2016  . Facial laceration 05/29/2016  . Blunt head trauma 05/29/2016  . SIADH (syndrome of inappropriate ADH production) ([facility]) 05/29/2016  . TBI (traumatic brain injury) ([facility]) 05/17/2016  . Rupture of plantar fascia 08/19/2015  . Degenerative tear of posterior horn of medial meniscus 06/15/2015  . Right hand pain 08/06/2013  . Leg length inequality 09/17/2012  . Pain, joint, ankle and foot 09/26/2011    Raynard Mapps, PT 06/29/2016, 4:03 PM  [facility] [address] [place], [place], [address] Phone: [phone]   Fax:  [phone]  Name: Annette Kidd MRN: [id number] Date of Birth: 1956/03/17

## 2016-06-29 NOTE — Patient Instructions (Signed)
Habituation - Tip Card  1.The goal of habituation training is to assist in decreasing symptoms of vertigo, dizziness, or nausea provoked by specific head and body motions. 2.These exercises may initially increase symptoms; however, be persistent and work through symptoms. With repetition and time, the exercises will assist in reducing or eliminating symptoms. 3.Exercises should be stopped and discussed with the therapist if you experience any of the following: - Sudden change or fluctuation in hearing - New onset of ringing in the ears, or increase in current intensity - Any fluid discharge from the ear - Severe pain in neck or back - Extreme nausea  Copyright  VHI. All rights reserved.   Habituation - Rolling   With pillow under head, start on back. Roll to your right side.  Hold until dizziness stops, plus 20 seconds and then roll to the left side.  Hold until dizziness stops, plus 20 seconds.  Repeat sequence 5 times per session. Do 2 sessions per day.  Copyright  VHI. All rights reserved.    Habituation - Sit to Side-Lying   Sit on edge of bed. Lie down onto the right side and hold until dizziness stops, plus 20 seconds.  Return to sitting and wait until dizziness stops, plus 20 seconds.  Repeat to the left side. Repeat sequence 5 times per session. Do 2 sessions per day.  Copyright  VHI. All rights reserved.    Gaze Stabilization - Tip Card  1.Target must remain in focus, not blurry, and appear stationary while head is in motion. 2.Perform exercises with small head movements (45 to either side of midline). 3.Increase speed of head motion so long as target is in focus. 4.If you wear eyeglasses, be sure you can see target through lens (therapist will give specific instructions for bifocal / progressive lenses). 5.These exercises may provoke dizziness or nausea. Work through these symptoms. If too dizzy, slow head movement slightly. Rest between each exercise. 6.Exercises  demand concentration; avoid distractions. 7.For safety, perform standing exercises close to a counter, wall, corner, or next to someone.  Copyright  VHI. All rights reserved.   Gaze Stabilization - Standing Feet Apart   Feet TOGETHER, keeping eyes on target on wall 3 feet away, tilt head down slightly and move head side to side for 30 seconds. Repeat while moving head up and down for 30 seconds. *Work up to tolerating 60 seconds, as able. Do 2-3 sessions per day.  Feet Together, Head Motion - Eyes Closed      With eyes closed and feet together, move head slowly, up and down X 10 reps and then side to side x 10 reps. Do 2 sessions per day.  Copyright  VHI. All rights reserved.   Feet Apart (Compliant Surface) Head Motion - Eyes Closed    Stand on compliant surface: pillow (near support surface or corner) with feet shoulder width apart. Close eyes and move head slowly, up and down x 10 reps and then side to side 10 reps. Do 2 sessions per day.  Copyright  VHI. All rights reserved.

## 2016-07-03 ENCOUNTER — Encounter: Payer: Managed Care, Other (non HMO) | Admitting: Occupational Therapy

## 2016-07-03 ENCOUNTER — Encounter: Payer: Self-pay | Admitting: Physical Therapy

## 2016-07-03 ENCOUNTER — Ambulatory Visit: Payer: Managed Care, Other (non HMO) | Admitting: Physical Therapy

## 2016-07-03 DIAGNOSIS — R262 Difficulty in walking, not elsewhere classified: Secondary | ICD-10-CM

## 2016-07-03 DIAGNOSIS — R2681 Unsteadiness on feet: Secondary | ICD-10-CM | POA: Diagnosis not present

## 2016-07-03 DIAGNOSIS — R42 Dizziness and giddiness: Secondary | ICD-10-CM

## 2016-07-03 NOTE — Therapy (Signed)
[facility] [address] [place], [place], [address] Phone: [phone]   Fax:  [phone]  Physical Therapy Treatment  Patient Details  Name: Annette Kidd MRN: [id number] Date of Birth: [birth year] Referring Provider: Freeman Caldron, PA-C  Encounter Date: 07/03/2016      PT End of Session - 07/03/16 2050    Visit Number 8   Number of Visits 16   Date for PT Re-Evaluation 08/05/16   Authorization Type [employer]-60 visits combined PT/OT   PT Start Time 1404   PT Stop Time 1448   PT Time Calculation (min) 44 min   Activity Tolerance Patient tolerated treatment well   Behavior During Therapy [facility] for tasks assessed/performed      History reviewed. No pertinent past medical history.  Past Surgical History:  Procedure Laterality Date  . APPENDECTOMY      There were no vitals filed for this visit.      Subjective Assessment - 07/03/16 1415    Subjective Pt continues to report dizziness daily.  Sleep does not seem to affect dizziness.  Dizziness described as dysequilibrium that is intensified by habituation exercises.  Pt reports symptoms don't return to baseline until she takes an afternoon nap.  Continues to deny symptoms of vertigo.   Pertinent History *patient reports that she had an episode of 8/10 spinning dizziness approximately 15 years ago due to bacterial infection.     Limitations Other (comment);Walking;Standing   Patient Stated Goals Walk longer and return to regular activities-yoga, typing, gardening, hiking, fitness when cleared by MD   Currently in Pain? No/denies                Vestibular Assessment - 07/03/16 1425      Vestibular Assessment   General Observation Head tilt and rotation to R     Occulomotor Exam   Comment Performed cover test and alternate cover tests in sitting and supine; abnormal-esophoria noted; reports some brief diplopia during test.  Also demonstrates impaired  convergence.     Positional Testing   Dix-Hallpike Dix-Hallpike Right;Dix-Hallpike Left     Dix-Hallpike Right   Dix-Hallpike Right Duration 10 seconds   Dix-Hallpike Right Symptoms Upbeat, right rotatory nystagmus     Dix-Hallpike Left   Dix-Hallpike Left Duration 2 seconds   Dix-Hallpike Left Symptoms No nystagmus           Vestibular Treatment/Exercise - 07/03/16 1425      Vestibular Treatment/Exercise   Vestibular Treatment Provided Canalith Repositioning   Canalith Repositioning Epley Manuever Right   Habituation Exercises Comment  otolithic stimulation on trampoline x 30 seconds      EPLEY MANUEVER RIGHT   Number of Reps  2   Overall Response Improved Symptoms            PT Education - 07/03/16 2047    Education provided Yes   Education Details possible presence of otolithic organ dysfunction, possible referral to neuro-ophthamology   Person(s) Educated Patient   Methods Explanation   Comprehension Verbalized understanding          PT Short Term Goals - 06/13/16 1307      PT SHORT TERM GOAL #1   Title  (TARGET DATE FOR ALL STG 07/05/16) pt will be independent with HEP   Status On-going     PT SHORT TERM GOAL #2   Title will demonstrate safe floor <> furniture transfer (for gardening/yoga) independently   Status On-going     PT SHORT  TERM GOAL #3   Title will ambulate >1000' over uneven surfaces outdoors with supervision and report symptoms of pain or dizziness as <2/10   Status On-going     PT SHORT TERM GOAL #4   Title Will negotiate 12 stairs alternating sequence, no UE at supervision level   Baseline requires one UE support for 12 stairs or supervision for no UE support   Status On-going     PT SHORT TERM GOAL #5   Title Will complete DGI and LTG set   Baseline FGA performed 06/13/16   Status Achieved           PT Long Term Goals - 06/26/16 0955      PT LONG TERM GOAL #1   Title (TARGET DATE FOR ALL LTG 08/05/16) will report symptoms  of pain, dizziness, fatigue of <2 after participating in one full yoga class   Status On-going     PT LONG TERM GOAL #2   Title Will improve functional leg strength as indicated by a decrease in 5 times sit <> stand time by 4 seconds. (6 seconds)   Baseline 10 sec   Status On-going     PT LONG TERM GOAL #3   Title Pt will demonstrate improved balance and independence with community gait as indicated by gait velocity increasing to >4 ft/sec   Baseline 3.28 ft/sec   Status On-going     PT LONG TERM GOAL #4   Title Will perform up/down 12 stairs, alternating sequence, no UE at mod I level to negotiate stairs in home safely   Status On-going     PT LONG TERM GOAL #5   Title improve FGA to >/= 28/30 for improved functional mobility   Status New               Plan - 07/03/16 2050    Clinical Impression Statement Pt continues to report daily dysequilibrium that persists for hours s/p performing HEP.  Pt re-assessed for positional vertigo with very low amplitude nystagmus observed and reports of brief vertigo; pt treated x 2.  Discussed addition of aerobic exercise to patient's weekly routine.  Continued assessment of vestibular system by assessing ocular alignment to determine otolithic organ involvement.  Pt appears to have mild esophoria.  Pt engaged in trampoline exercise to stimulate otolithic organs x 30 seconds.  Will continue to address and incorporate in PT treatment sessions.  Will continue to assess pt need for other referrals including neuro-ophthamology.      PT Treatment/Interventions ADLs/Self Care Home Management;Canalith Repostioning;Moist Heat;Gait training;Stair training;Functional mobility training;Therapeutic activities;Therapeutic exercise;Balance training;Neuromuscular re-education;Cognitive remediation;Patient/family education;Manual techniques;Passive range of motion;Energy conservation;Taping;Vestibular   PT Next Visit Plan Re-assess STG! addition of aerobic walking  program, otolithic stimulation-trampoline/therapy ball      Patient will benefit from skilled therapeutic intervention in order to improve the following deficits and impairments:  Decreased activity tolerance, Decreased balance, Decreased cognition, Decreased endurance, Decreased strength, Difficulty walking, Dizziness, Pain  Visit Diagnosis: Unsteadiness on feet  Dizziness and giddiness  Difficulty in walking, not elsewhere classified     Problem List Patient Active Problem List   Diagnosis Date Noted  . Open nasal fracture 05/29/2016  . Facial laceration 05/29/2016  . Blunt head trauma 05/29/2016  . SIADH (syndrome of inappropriate ADH production) (HCC) 05/29/2016  . TBI (traumatic brain injury) (HCC) 05/17/2016  . Rupture of plantar fascia 08/19/2015  . Degenerative tear of posterior horn of medial meniscus 06/15/2015  . Right hand pain 08/06/2013  .  Leg length inequality 09/17/2012  . Pain, joint, ankle and foot 09/26/2011   .Edman CircleAudra Hall, PT, DPT 07/03/16    9:13 PM   [facility] [address] [place], [place], [address] Phone: [phone]   Fax:  [phone]  Name: Annette Kidd MRN: [id number] Date of Birth: [birth year]

## 2016-07-06 ENCOUNTER — Ambulatory Visit: Payer: Managed Care, Other (non HMO) | Attending: Orthopedic Surgery | Admitting: Physical Therapy

## 2016-07-06 ENCOUNTER — Encounter: Payer: Self-pay | Admitting: Physical Therapy

## 2016-07-06 ENCOUNTER — Encounter: Payer: Managed Care, Other (non HMO) | Admitting: Occupational Therapy

## 2016-07-06 DIAGNOSIS — R262 Difficulty in walking, not elsewhere classified: Secondary | ICD-10-CM | POA: Insufficient documentation

## 2016-07-06 DIAGNOSIS — R2681 Unsteadiness on feet: Secondary | ICD-10-CM | POA: Diagnosis not present

## 2016-07-06 DIAGNOSIS — R41844 Frontal lobe and executive function deficit: Secondary | ICD-10-CM | POA: Insufficient documentation

## 2016-07-06 DIAGNOSIS — R41841 Cognitive communication deficit: Secondary | ICD-10-CM | POA: Diagnosis present

## 2016-07-06 DIAGNOSIS — R42 Dizziness and giddiness: Secondary | ICD-10-CM | POA: Diagnosis present

## 2016-07-06 NOTE — Therapy (Signed)
[place] [address] [place], [place], [address] Phone: [phone]   Fax:  [phone]  Physical Therapy Treatment  Patient Details  Name: Annette Kidd MRN: [id number] Date of Birth: 08-25-1955 Referring Provider: Lisette Abu, PA-C  Encounter Date: 07/06/2016      PT End of Session - 07/06/16 1702    Visit Number 9   Number of Visits 16   Date for PT Re-Evaluation 08/05/16   Authorization Type [employer]-60 visits combined PT/OT   PT Start Time 1448   PT Stop Time 1535   PT Time Calculation (min) 47 min   Activity Tolerance Patient tolerated treatment well   Behavior During Therapy [facility] for tasks assessed/performed      History reviewed. No pertinent past medical history.  Past Surgical History:  Procedure Laterality Date  . APPENDECTOMY      There were no vitals filed for this visit.      Subjective Assessment - 07/06/16 1501    Subjective Pt reports continued dizziness side > sit during habituation-minimal change.  Had root canal yesterday but feeling okay today.   Pertinent History *patient reports that she had an episode of 8/10 spinning dizziness approximately 15 years ago due to bacterial infection.     Limitations Other (comment);Walking;Standing   Patient Stated Goals Walk longer and return to regular activities-yoga, typing, gardening, hiking, fitness when cleared by MD           [facility] Adult PT Treatment/Exercise - 07/06/16 1504      Ambulation/Gait   Stairs Yes   Stairs Assistance 6: Modified independent (Device/Increase time)  due to dizziness when turning   Stair Management Technique No rails;Alternating pattern;Forwards   Number of Stairs 16   Height of Stairs 6     Therapeutic Activites    Therapeutic Activities Other Therapeutic Activities   Other Therapeutic Activities floor<>stand transfer without UE support x 2 reps each LE; also performed half kneeling <> crescent lunge x  10 reps each LE         Vestibular Treatment/Exercise - 07/06/16 1524      Vestibular Treatment/Exercise   Vestibular Treatment Provided Gaze;Habituation   Habituation Exercises Standing Horizontal Head Turns;Standing Vertical Head Turns;180 degree Turns;360 degree Turns   Gaze Exercises X1 Viewing Horizontal;X1 Viewing Vertical;X2 Viewing Horizontal;X2 Viewing Vertical     Standing Horizontal Head Turns   Number of Reps  10   Symptom Description  while bouncing and marching on trampoline     Standing Vertical Head Turns   Number of Reps  10   Symptom Description  while bouncing and marching on trampoline     180 degree Turns   Number of Reps  5   Symptom Description  90 and 180 deg turns; while marching on trampoline     360 degree Turns   Number of Reps  5   Symptom Description  while marching on trampoline   COMMENT All turns-head and body provoked mild-moderate dizziness     X1 Viewing Horizontal   Foot Position feet together on trampoline   Reps 2   Comments 60 seconds     X1 Viewing Vertical   Foot Position feet together standing on trampoline   Reps 2   Comments 60 seconds     X2 Viewing Horizontal   Foot Position seated   Reps 1    Comments 60 seconds     X2 Viewing Vertical   Foot Position seated  Reps 1   Comments 60 seconds               PT Education - 07/06/16 1701    Education provided Yes   Education Details addition of x 2 viewing to HEP, increased length of x 1 viewing   Person(s) Educated Patient   Methods Explanation   Comprehension Verbalized understanding          PT Short Term Goals - 07/06/16 1704      PT SHORT TERM GOAL #1   Title  (TARGET DATE FOR ALL STG 07/05/16) pt will be independent with HEP   Status Achieved     PT SHORT TERM GOAL #2   Title will demonstrate safe floor <> furniture transfer (for gardening/yoga) independently   Status Achieved     PT [place] #3   Title will ambulate >1000' over uneven  surfaces outdoors with supervision and report symptoms of pain or dizziness as <2/10   Status Unable to assess     PT SHORT TERM GOAL #4   Title Will negotiate 12 stairs alternating sequence, no UE at supervision level   Baseline requires one UE support for 12 stairs or supervision for no UE support   Status Achieved     PT SHORT TERM GOAL #5   Title Will complete DGI and LTG set   Baseline FGA performed 06/13/16   Status Achieved           PT Long Term Goals - 06/26/16 0955      PT LONG TERM GOAL #1   Title (TARGET DATE FOR ALL LTG 08/05/16) will report symptoms of pain, dizziness, fatigue of <2 after participating in one full yoga class   Status On-going     PT LONG TERM GOAL #2   Title Will improve functional leg strength as indicated by a decrease in 5 times sit <> stand time by 4 seconds. (6 seconds)   Baseline 10 sec   Status On-going     PT LONG TERM GOAL #3   Title Pt will demonstrate improved balance and independence with community gait as indicated by gait velocity increasing to >4 ft/sec   Baseline 3.28 ft/sec   Status On-going     PT LONG TERM GOAL #4   Title Will perform up/down 12 stairs, alternating sequence, no UE at mod I level to negotiate stairs in home safely   Status On-going     PT LONG TERM GOAL #5   Title improve FGA to >/= 28/30 for improved functional mobility   Status New               Plan - 07/06/16 1702    Clinical Impression Statement Treatment session focused on re-assessment of STG.  Pt making good progress and has met 4/5 STG with outdoor ambulation goal unable to assess today due to weather.  Continued to progress vestibular training and HEP with pt reporting mild-moderate increase in symptoms.  Pt will benefit from continued therapy services to progress towards targeted LTG.   Rehab Potential Good   PT Frequency 2x / week   PT Duration 8 weeks   PT Treatment/Interventions ADLs/Self Care Home Management;Canalith  Repostioning;Moist Heat;Gait training;Stair training;Functional mobility training;Therapeutic activities;Therapeutic exercise;Balance training;Neuromuscular re-education;Cognitive remediation;Patient/family education;Manual techniques;Passive range of motion;Energy conservation;Taping;Vestibular   PT Next Visit Plan discuss aerobic walking program, trampoline/therapy ball   Consulted and Agree with Plan of Care Patient      Patient will benefit from skilled therapeutic intervention  in order to improve the following deficits and impairments:  Decreased activity tolerance, Decreased balance, Decreased cognition, Decreased endurance, Decreased strength, Difficulty walking, Dizziness, Pain  Visit Diagnosis: Unsteadiness on feet  Dizziness and giddiness  Difficulty in walking, not elsewhere classified     Problem List Patient Active Problem List   Diagnosis Date Noted  . Open nasal fracture 05/29/2016  . Facial laceration 05/29/2016  . Blunt head trauma 05/29/2016  . SIADH (syndrome of inappropriate ADH production) ([place]) 05/29/2016  . TBI (traumatic brain injury) ([place]) 05/17/2016  . Rupture of plantar fascia 08/19/2015  . Degenerative tear of posterior horn of medial meniscus 06/15/2015  . Right hand pain 08/06/2013  . Leg length inequality 09/17/2012  . Pain, joint, ankle and foot 09/26/2011   Raylene Everts, PT, DPT 07/06/16    5:09 PM   [place] [address] [place], [place], [address] Phone: [phone]   Fax:  [phone]  Name: SWEDEN LESURE MRN: [id number] Date of Birth: [birth year]

## 2016-07-07 ENCOUNTER — Telehealth: Payer: Self-pay | Admitting: Physical Therapy

## 2016-07-07 NOTE — Telephone Encounter (Signed)
Hello Dr. Franky Machoabbell, PT and OT have been working with pt Annette Kidd, [name] DOB 1956/02/03.  OT has recently discharged pt and feels she would be more appropriate for speech therapy at this time due to ongoing cognitive issues.  If you agree please enter in [facility] or fax referral for Speech therapy eval and [facility].    Thank you, Edman CircleAudra Hall, PT, DPT 07/07/16    5:21 PM

## 2016-07-10 ENCOUNTER — Encounter: Payer: Self-pay | Admitting: Physical Therapy

## 2016-07-10 ENCOUNTER — Ambulatory Visit: Payer: Managed Care, Other (non HMO) | Admitting: Physical Therapy

## 2016-07-10 DIAGNOSIS — R2681 Unsteadiness on feet: Secondary | ICD-10-CM | POA: Diagnosis not present

## 2016-07-10 DIAGNOSIS — R262 Difficulty in walking, not elsewhere classified: Secondary | ICD-10-CM

## 2016-07-10 DIAGNOSIS — R42 Dizziness and giddiness: Secondary | ICD-10-CM

## 2016-07-10 NOTE — Therapy (Signed)
[facility] [address] [place], [place], [address] Phone: [phone]   Fax:  [phone]  Physical Therapy Treatment  Patient Details  Name: Annette Kidd MRN: [id number] Date of Birth: August 11, 1955 Referring Provider: Freeman CaldronMichael J Jeffery, PA-C  Encounter Date: 07/10/2016      PT End of Session - 07/10/16 1257    Visit Number 10   Number of Visits 16   Date for PT Re-Evaluation 08/05/16   Authorization Type [employer]-60 visits combined PT/OT   PT Start Time 1150   PT Stop Time 1230   PT Time Calculation (min) 40 min   Activity Tolerance Patient tolerated treatment well   Behavior During Therapy [facility] for tasks assessed/performed      History reviewed. No pertinent past medical history.  Past Surgical History:  Procedure Laterality Date  . APPENDECTOMY      There were no vitals filed for this visit.      Subjective Assessment - 07/10/16 1155    Subjective Reports going to grocery store and dog park this weekend, no issues just fatigued.  Is doing more housework but still avoiding quick movements.  Still performing exercises-standing on pillow and x 2 viewing still provides significant challenge.   Patient is accompained by: Family member   Pertinent History *patient reports that she had an episode of 8/10 spinning dizziness approximately 15 years ago due to bacterial infection.     Limitations Other (comment);Walking;Standing   Patient Stated Goals Walk longer and return to regular activities-yoga, typing, gardening, hiking, fitness when cleared by MD   Currently in Pain? No/denies           [facility] Adult [facility] Treatment/Exercise - 07/10/16 1201      Knee/Hip Exercises: Aerobic   Tread Mill Resting HR: 74 bpm. Performed treadmill x 8 minutes at 1.4 mph without UE support with pt demonstrating decreased step length and veering to L; verbal cues for increased stance time, step and stride length.  Added in lateral head  turns with pt requiring mod A to maintain balance.           Vestibular Treatment/Exercise - 07/10/16 1219      Vestibular Treatment/Exercise   Vestibular Treatment Provided Habituation   Habituation Exercises Comment  repeated supine <> sit 3 sets x 5 reps   Gaze Exercises X1 Viewing Horizontal;X1 Viewing Vertical;X2 Viewing Horizontal;X2 Viewing Vertical     X1 Viewing Horizontal   Foot Position feet together   Reps 1   Comments 30 seconds with busy background     X1 Viewing Vertical   Foot Position feet together   Reps 1   Comments 30 seconds with busy background     X2 Viewing Horizontal   Foot Position feet apart, standing   Reps 1    Comments 30 seconds with busy background     X2 Viewing Vertical   Foot Position feet apart, standing   Reps 1   Comments 30 seconds with busy background               PT Education - 07/10/16 1253    Education provided Yes   Education Details HEP adjustment   Person(s) Educated Patient   Methods Explanation   Comprehension Verbalized understanding          PT Short Term Goals - 07/06/16 1704      PT SHORT TERM GOAL #1   Title  (TARGET DATE FOR ALL STG 07/05/16) pt will be independent  with HEP   Status Achieved     PT SHORT TERM GOAL #2   Title will demonstrate safe floor <> furniture transfer (for gardening/yoga) independently   Status Achieved     PT SHORT TERM GOAL #3   Title will ambulate >1000' over uneven surfaces outdoors with supervision and report symptoms of pain or dizziness as <2/10   Status Unable to assess     PT SHORT TERM GOAL #4   Title Will negotiate 12 stairs alternating sequence, no UE at supervision level   Baseline requires one UE support for 12 stairs or supervision for no UE support   Status Achieved     PT SHORT TERM GOAL #5   Title Will complete DGI and LTG set   Baseline FGA performed 06/13/16   Status Achieved           PT Long Term Goals - 06/26/16 0955      PT LONG TERM  GOAL #1   Title (TARGET DATE FOR ALL LTG 08/05/16) will report symptoms of pain, dizziness, fatigue of <2 after participating in one full yoga class   Status On-going     PT LONG TERM GOAL #2   Title Will improve functional leg strength as indicated by a decrease in 5 times sit <> stand time by 4 seconds. (6 seconds)   Baseline 10 sec   Status On-going     PT LONG TERM GOAL #3   Title Pt will demonstrate improved balance and independence with community gait as indicated by gait velocity increasing to >4 ft/sec   Baseline 3.28 ft/sec   Status On-going     PT LONG TERM GOAL #4   Title Will perform up/down 12 stairs, alternating sequence, no UE at mod I level to negotiate stairs in home safely   Status On-going     PT LONG TERM GOAL #5   Title improve FGA to >/= 28/30 for improved functional mobility   Status New               Plan - 07/10/16 1257    Clinical Impression Statement Continued to focus on habituation training with adjustment of HEP to performing repeated sit <> supine instead of repeated rolling.  Also focused on gait on treadmill at increased speeds without UE support and addition of head turns for vestibular and aerobic training.  Continued adaptation training with addition of busy background to x1 and x2 viewing.  Pt tolerated well with minimal increase in symptoms.  Will continue to address.     Rehab Potential Good   PT Frequency 2x / week   PT Duration 8 weeks   PT Treatment/Interventions ADLs/Self Care Home Management;Canalith Repostioning;Moist Heat;Gait training;Stair training;Functional mobility training;Therapeutic activities;Therapeutic exercise;Balance training;Neuromuscular re-education;Cognitive remediation;Patient/family education;Manual techniques;Passive range of motion;Energy conservation;Taping;Vestibular   PT Next Visit Plan treadmill gait training, trampoline/therapy ball, progress gaze stabilization exercises   Consulted and Agree with Plan of  Care Patient      Patient will benefit from skilled therapeutic intervention in order to improve the following deficits and impairments:  Decreased activity tolerance, Decreased balance, Decreased cognition, Decreased endurance, Decreased strength, Difficulty walking, Dizziness, Pain  Visit Diagnosis: Unsteadiness on feet  Dizziness and giddiness  Difficulty in walking, not elsewhere classified     Problem List Patient Active Problem List   Diagnosis Date Noted  . Open nasal fracture 05/29/2016  . Facial laceration 05/29/2016  . Blunt head trauma 05/29/2016  . SIADH (syndrome of inappropriate ADH production) (HCC)  05/29/2016  . TBI (traumatic brain injury) (HCC) 05/17/2016  . Rupture of plantar fascia 08/19/2015  . Degenerative tear of posterior horn of medial meniscus 06/15/2015  . Right hand pain 08/06/2013  . Leg length inequality 09/17/2012  . Pain, joint, ankle and foot 09/26/2011   Edman Circle, PT, DPT 07/10/16    1:04 PM   [facility] [address] [place], [place], [address] Phone: [phone]   Fax:  [phone]  Name: Annette Kidd MRN: [id number] Date of Birth: 06-19-1955

## 2016-07-13 ENCOUNTER — Encounter: Payer: Self-pay | Admitting: Physical Therapy

## 2016-07-13 ENCOUNTER — Ambulatory Visit: Payer: Managed Care, Other (non HMO) | Admitting: Physical Therapy

## 2016-07-13 DIAGNOSIS — R42 Dizziness and giddiness: Secondary | ICD-10-CM

## 2016-07-13 DIAGNOSIS — R2681 Unsteadiness on feet: Secondary | ICD-10-CM

## 2016-07-13 DIAGNOSIS — R262 Difficulty in walking, not elsewhere classified: Secondary | ICD-10-CM

## 2016-07-13 NOTE — Therapy (Signed)
[facility] [address] [place], [place], [address] Phone: [phone]   Fax:  [phone]  Physical Therapy Treatment  Patient Details  Name: Annette Kidd MRN: [id number] Date of Birth: 1956/03/05 Referring Provider: Lisette Abu, PA-C  Encounter Date: 07/13/2016      PT End of Session - 07/13/16 1550    Visit Number 11   Number of Visits 16   Date for PT Re-Evaluation 08/05/16   Authorization Type [employer]-60 visits combined PT/OT   PT Start Time 1315   PT Stop Time 1400   PT Time Calculation (min) 45 min   Activity Tolerance Patient tolerated treatment well   Behavior During Therapy [facility] for tasks assessed/performed      History reviewed. No pertinent past medical history.  Past Surgical History:  Procedure Laterality Date  . APPENDECTOMY      There were no vitals filed for this visit.      Subjective Assessment - 07/13/16 1322    Subjective Reports going to friends house for lunch and knitting yesterday; friend told pt that she did not notice a difference in speech.  Reports walking the dog x 2 alone.  Is now able to ambulate without holding onto another person.  Brought in letter against busy background to practice with.   Pertinent History *patient reports that she had an episode of 8/10 spinning dizziness approximately 15 years ago due to bacterial infection.     Limitations Other (comment);Walking;Standing   Patient Stated Goals Walk longer and return to regular activities-yoga, typing, gardening, hiking, fitness when cleared by MD   Currently in Pain? No/denies           [facility] Adult PT Treatment/Exercise - 07/13/16 1326      Ambulation/Gait   Stairs Yes  no reports of dizziness today   Stairs Assistance 7: Independent  also performed while carrying 10lb and 7lb in UE   Stair Management Technique No rails;Alternating pattern;Forwards   Number of Stairs 16   Height of Stairs 6     Knee/Hip Exercises: Aerobic   Tread Mill 2.2 mph, no UE support without and then with partial and full head turns x 10 minutes.  Demonstrating improved bilat step and stride length and foot clearance bilaterally and increased arm swing.  Dizziness 3/10 afterwards and HR: 80 bpm         Vestibular Treatment/Exercise - 07/13/16 1341      Vestibular Treatment/Exercise   Vestibular Treatment Provided Gaze   Gaze Exercises X1 Viewing Horizontal;X1 Viewing Vertical;X2 Viewing Horizontal;X2 Viewing Vertical     X1 Viewing Horizontal   Foot Position tall kneeling on BOSU   Reps 1   Comments 60 seconds with busy background     X1 Viewing Vertical   Foot Position tall kneeling on Bosu ball   Reps 1   Comments 60 seconds with busy background     X2 Viewing Horizontal   Foot Position tall kneeling on Bosu ball   Reps 2    Comments 10 reps      X2 Viewing Vertical   Foot Position tall kneeling on Bosu ball   Reps 2   Comments 10 reps           PT Short Term Goals - 07/06/16 1704      PT SHORT TERM GOAL #1   Title  (TARGET DATE FOR ALL STG 07/05/16) pt will be independent with HEP   Status Achieved  PT SHORT TERM GOAL #2   Title will demonstrate safe floor <> furniture transfer (for gardening/yoga) independently   Status Achieved     PT SHORT TERM GOAL #3   Title will ambulate >1000' over uneven surfaces outdoors with supervision and report symptoms of pain or dizziness as <2/10   Status Unable to assess     PT SHORT TERM GOAL #4   Title Will negotiate 12 stairs alternating sequence, no UE at supervision level   Baseline requires one UE support for 12 stairs or supervision for no UE support   Status Achieved     PT SHORT TERM GOAL #5   Title Will complete DGI and LTG set   Baseline FGA performed 06/13/16   Status Achieved           PT Long Term Goals - 07/13/16 1556      PT LONG TERM GOAL #1   Title (TARGET DATE FOR ALL LTG 08/05/16) will report symptoms of pain,  dizziness, fatigue of <2 after participating in one full yoga class   Status On-going     PT LONG TERM GOAL #2   Title Will improve functional leg strength as indicated by a decrease in 5 times sit <> stand time by 4 seconds. (6 seconds)   Baseline 10 sec   Status On-going     PT LONG TERM GOAL #3   Title Pt will demonstrate improved balance and independence with community gait as indicated by gait velocity increasing to >4 ft/sec   Baseline 3.28 ft/sec   Status On-going     PT LONG TERM GOAL #4   Title Will perform up/down 12 stairs, alternating sequence, no UE at mod I level to negotiate stairs in home safely   Baseline Met 07/13/16   Status Achieved     PT LONG TERM GOAL #5   Title improve FGA to >/= 28/30 for improved functional mobility   Status New            Plan - 07/13/16 1551    Clinical Impression Statement Pt continues to make good progress in all areas and was able to progress gait on treadmill to higher speeds and maintain gait sequence without UE support; still demonstrates intermittent LOB with full head turns.  Also able to progress x 1 viewing to busy background and more challenging balance positions but continues to require increased staibility with x 2 viewing.  Pt has met stair goal.     Rehab Potential Good   PT Treatment/Interventions ADLs/Self Care Home Management;Canalith Repostioning;Moist Heat;Gait training;Stair training;Functional mobility training;Therapeutic activities;Therapeutic exercise;Balance training;Neuromuscular re-education;Cognitive remediation;Patient/family education;Manual techniques;Passive range of motion;Energy conservation;Taping;Vestibular   PT Next Visit Plan re-assess canals for cupulo-treat if indicated   Consulted and Agree with Plan of Care Patient      Patient will benefit from skilled therapeutic intervention in order to improve the following deficits and impairments:  Decreased activity tolerance, Decreased balance, Decreased  cognition, Decreased endurance, Decreased strength, Difficulty walking, Dizziness, Pain  Visit Diagnosis: Unsteadiness on feet  Dizziness and giddiness  Difficulty in walking, not elsewhere classified     Problem List Patient Active Problem List   Diagnosis Date Noted  . Open nasal fracture 05/29/2016  . Facial laceration 05/29/2016  . Blunt head trauma 05/29/2016  . SIADH (syndrome of inappropriate ADH production) ([place]) 05/29/2016  . TBI (traumatic brain injury) ([place]) 05/17/2016  . Rupture of plantar fascia 08/19/2015  . Degenerative tear of posterior horn of medial meniscus 06/15/2015  .  Right hand pain 08/06/2013  . Leg length inequality 09/17/2012  . Pain, joint, ankle and foot 09/26/2011    Raylene Everts, PT, DPT 07/13/16    3:59 PM    [facility] [address] [place], [place], [address] Phone: [phone]   Fax:  [phone]  Name: Annette Kidd MRN: [id number] Date of Birth: 12-Aug-1955

## 2016-07-17 ENCOUNTER — Ambulatory Visit: Payer: Managed Care, Other (non HMO) | Admitting: Physical Therapy

## 2016-07-17 DIAGNOSIS — R42 Dizziness and giddiness: Secondary | ICD-10-CM

## 2016-07-17 DIAGNOSIS — R2681 Unsteadiness on feet: Secondary | ICD-10-CM

## 2016-07-17 DIAGNOSIS — R262 Difficulty in walking, not elsewhere classified: Secondary | ICD-10-CM

## 2016-07-17 NOTE — Therapy (Signed)
[facility] [address] [place], [place], [address] Phone: [phone]   Fax:  [phone]  Physical Therapy Treatment  Patient Details  Name: Annette Kidd MRN: [id number] Date of Birth: 1955/11/08 Referring Provider: Freeman Caldron, PA-C  Encounter Date: 07/17/2016      PT End of Session - 07/17/16 1201    Visit Number 12   Number of Visits 16   Date for PT Re-Evaluation 08/05/16   Authorization Type [employer]-60 visits combined PT/OT   PT Start Time 1020   PT Stop Time 1058   PT Time Calculation (min) 38 min   Equipment Utilized During Treatment Gait belt   Activity Tolerance Patient tolerated treatment well   Behavior During Therapy [facility] for tasks assessed/performed      No past medical history on file.  Past Surgical History:  Procedure Laterality Date  . APPENDECTOMY      There were no vitals filed for this visit.      Subjective Assessment - 07/17/16 1022    Subjective Went to 3D movie; "just tiring to watch."   Pertinent History *patient reports that she had an episode of 8/10 spinning dizziness approximately 15 years ago due to bacterial infection.     Limitations Other (comment);Walking;Standing   Patient Stated Goals Walk longer and return to regular activities-yoga, typing, gardening, hiking, fitness when cleared by MD   Currently in Pain? No/denies                Vestibular Assessment - 07/17/16 1222      Positional Testing   Dix-Hallpike Dix-Hallpike Right;Dix-Hallpike Left   Horizontal Canal Testing Horizontal Canal Right;Horizontal Canal Left     Dix-Hallpike Right   Dix-Hallpike Right Duration 0   Dix-Hallpike Right Symptoms No nystagmus     Dix-Hallpike Left   Dix-Hallpike Left Duration 0   Dix-Hallpike Left Symptoms No nystagmus     Horizontal Canal Right   Horizontal Canal Right Duration 0   Horizontal Canal Right Symptoms Normal     Horizontal Canal Left    Horizontal Canal Left Duration 0   Horizontal Canal Left Symptoms Normal                 [facility] Adult PT Treatment/Exercise - 07/17/16 0001      Ambulation/Gait   Ambulation/Gait Yes   Ambulation/Gait Assistance 5: Supervision   Ambulation/Gait Assistance Details Gait training on treadmill working on balance and tolerance with decreased UE support and visual scanning task- cues for increasing step length increasing arm swing, speed up to 2. , 8 min.                                 Assistive device None   Gait Pattern Step-through pattern           [facility]) - 07/17/16 1218     QUADRUPED PWR! Step   x20 progressing with visual scanning; performed on mat   STANDING PWR! Up  x10   PWR! Rock x10   PWR! Twist x10   PWR Step x10   Comments Peformed on foam balance beam, cues for visual scanning          Balance Exercises - 07/17/16 1220      Balance Exercises:    Other Standing Exercises quadruped: contralateral UE/LE raises working on balance and activity tolerance.  PT Short Term Goals - 07/06/16 1704      PT SHORT TERM GOAL #1   Title  (TARGET DATE FOR ALL STG 07/05/16) pt will be independent with HEP   Status Achieved     PT SHORT TERM GOAL #2   Title will demonstrate safe floor <> furniture transfer (for gardening/yoga) independently   Status Achieved     PT SHORT TERM GOAL #3   Title will ambulate >1000' over uneven surfaces outdoors with supervision and report symptoms of pain or dizziness as <2/10   Status Unable to assess     PT SHORT TERM GOAL #4   Title Will negotiate 12 stairs alternating sequence, no UE at supervision level   Baseline requires one UE support for 12 stairs or supervision for no UE support   Status Achieved     PT SHORT TERM GOAL #5   Title Will complete DGI and LTG set   Baseline FGA performed 06/13/16   Status Achieved           PT Long Term Goals - 07/17/16 1222      PT LONG TERM GOAL #1    Title (TARGET DATE FOR ALL LTG 08/05/16) will report symptoms of pain, dizziness, fatigue of <2 after participating in one full yoga class   Status On-going     PT LONG TERM GOAL #2   Title Will improve functional leg strength as indicated by a decrease in 5 times sit <> stand time by 4 seconds. (6 seconds)   Baseline 10 sec   Status On-going     PT LONG TERM GOAL #3   Title Pt will demonstrate improved balance and independence with community gait as indicated by gait velocity increasing to >4 ft/sec   Baseline 3.28 ft/sec   Status On-going     PT LONG TERM GOAL #5   Title improve FGA to >/= 28/30 for improved functional mobility   Status On-going               Plan - 07/17/16 1222    Clinical Impression Statement Pt reported some fatigue and a little more "fuzziness" with activity. Reported that Therapeutic activity was challenging but response to treatment was better then it has been.  Pt performed high level balance activities on compliant surface without UE support, self corrected small imbalances.   Rehab Potential Good   PT Treatment/Interventions ADLs/Self Care Home Management;Canalith Repostioning;Moist Heat;Gait training;Stair training;Functional mobility training;Therapeutic activities;Therapeutic exercise;Balance training;Neuromuscular re-education;Cognitive remediation;Patient/family education;Manual techniques;Passive range of motion;Energy conservation;Taping;Vestibular   PT Next Visit Plan re-assess canals for cupulo-treat if indicated   Consulted and Agree with Plan of Care Patient      Patient will benefit from skilled therapeutic intervention in order to improve the following deficits and impairments:  Decreased activity tolerance, Decreased balance, Decreased cognition, Decreased endurance, Decreased strength, Difficulty walking, Dizziness, Pain  Visit Diagnosis: Unsteadiness on feet  Dizziness and giddiness  Difficulty in walking, not elsewhere  classified     Problem List Patient Active Problem List   Diagnosis Date Noted  . Open nasal fracture 05/29/2016  . Facial laceration 05/29/2016  . Blunt head trauma 05/29/2016  . SIADH (syndrome of inappropriate ADH production) ([facility]) 05/29/2016  . TBI (traumatic brain injury) ([facility]) 05/17/2016  . Rupture of plantar fascia 08/19/2015  . Degenerative tear of posterior horn of medial meniscus 06/15/2015  . Right hand pain 08/06/2013  . Leg length inequality 09/17/2012  . Pain, joint, ankle and foot 09/26/2011  Hortencia Conradi, PTA  07/17/16, 12:28 PM [facility] [address] [place], [place], [address] Phone: [phone]   Fax:  [phone]  Name: Annette Kidd MRN: [id number] Date of Birth: [birth year]

## 2016-07-20 ENCOUNTER — Ambulatory Visit: Payer: Managed Care, Other (non HMO) | Admitting: Physical Therapy

## 2016-07-20 ENCOUNTER — Ambulatory Visit: Payer: Managed Care, Other (non HMO) | Admitting: Speech Pathology

## 2016-07-20 ENCOUNTER — Encounter: Payer: Self-pay | Admitting: Physical Therapy

## 2016-07-20 DIAGNOSIS — R2681 Unsteadiness on feet: Secondary | ICD-10-CM | POA: Diagnosis not present

## 2016-07-20 DIAGNOSIS — R42 Dizziness and giddiness: Secondary | ICD-10-CM

## 2016-07-20 DIAGNOSIS — R262 Difficulty in walking, not elsewhere classified: Secondary | ICD-10-CM

## 2016-07-20 DIAGNOSIS — R41841 Cognitive communication deficit: Secondary | ICD-10-CM

## 2016-07-20 NOTE — Therapy (Signed)
[facility] [address] [place], [place], [address] Phone: [phone]   Fax:  [phone]  Speech Language Pathology Evaluation  Patient Details  Name: Annette GillesLee P Kidd MRN: [id number] Date of Birth: 1955/10/16 Referring Provider: Dr. Barnett AbuHenry Elsner  Encounter Date: 07/20/2016      End of Session - 07/20/16 1226    Visit Number 1   Number of Visits 12   Date for SLP Re-Evaluation 08/31/16   SLP Start Time 1150   SLP Stop Time  1230   SLP Time Calculation (min) 40 min      No past medical history on file.  Past Surgical History:  Procedure Laterality Date  . APPENDECTOMY      There were no vitals filed for this visit.      Subjective Assessment - 07/20/16 1315    Subjective "My talking is not what it was"            [facility] - 07/20/16 1205      SLP Visit Information   SLP Received On 07/20/16   Referring Provider Dr. Barnett AbuHenry Elsner   Onset Date 05/17/16   Medical Diagnosis TBI     Subjective   Patient/Family Stated Goal "To improve all of the functions - memory, enthusiam, focus"     Pain Assessment   Currently in Pain? Yes   Pain Score 2    Pain Location Nose   Pain Type Chronic pain   Pain Frequency Constant   Pain Relieving Factors none   Effect of Pain on Daily Activities no     General Information   HPI Patient is a 61 y/o female with no PMH was hit by falling branch while walking her dog. + LOC. GCS of 12 and admitted as level 1 trauma. Found to have a small supratentorial SDH with concussive syndrome of amnesia. She also has a nasal bone fracture and maxilla fracture. Head CT 05/17/16 showed acute left tentorial subdural hematoma   Mobility Status walks independently, receiving PT     Prior Functional Status   Cognitive/Linguistic Baseline Within functional limits   Type of Home House    Lives With Spouse   Available Support Family;Friend(s);Neighbor   Vocation Part time  employment     Cognition   Overall Cognitive Status Impaired/Different from baseline   Area of Impairment Attention   Current Attention Level Divided   Attention Alternating   Selective Attention Impairment Verbal complex;Functional complex   Memory Impaired   Memory Impairment Decreased recall of new information   Awareness Appears intact   Problem Solving Appears intact   Executive Function Reasoning;Organizing   Reasoning Impaired   Reasoning Impairment Verbal complex;Functional complex   Organizing Impaired   Organizing Impairment Verbal complex;Functional complex   Behaviors Restless;Impulsive     Verbal Expression   Overall Verbal Expression Impaired   Naming Impairment   Divergent 75-100% accurate   Other Naming Comments word finding episodes reported in conversation - observed ttwice in eval                         SLP Education - 07/20/16 1226    Education provided Yes   Education Details goals for ST, areas of impairment, cognitive activities you can to at home   Person(s) Educated Patient   Methods Explanation   Comprehension Verbalized understanding;Returned demonstration            SLP Long Term Goals -  07/20/16 1507      SLP LONG TERM GOAL #1   Title Pt will divide attention betwen 2 moderately complex cognitive linguistic tasks with 90% on each and rare min A   Time 4   Period Weeks   Status New     SLP LONG TERM GOAL #2   Title Pt will perform complex attention to detail tasks with 90% accuracy and rare min A   Time 4   Period Weeks   Status New     SLP LONG TERM GOAL #3   Title Pt will complete complex naming tasks with rare min A   Time 4   Period Weeks   Status New          Plan - 07/20/16 1456    Clinical Impression Statement Mrs. Donati is referred for ST s/p TBI with SDH. She was noted to have higher level cognitive impairments by OT. She reports difficulty "multitasking" and speaking with some word finding  difficulties. Pt scored a 30/30   on the [facility] Cognitive Assessment given by OT.  Assesment today revealed reduced attention to details, impulsivity and reduced divided attention on check writing task, with repeated cues rerquired to follow the written directions. Assessment of aphasia was [facility], however in conversation, pt observed to have 2 episodes of word finding difficulty. She in in [employer] and is concerned that her diffiuclty speaking will affect her work. Simple math and reasoning WFL. Pt noted to have internal distractions. I recommend a short course of ST to improve high level cognitive impairments for success at work.   Speech Therapy Frequency 2x / week   Treatment/Interventions Cognitive reorganization;Internal/external aids;SLP instruction and feedback;Functional tasks;Patient/family education;Compensatory strategies;Language facilitation   Potential to Achieve Goals Good   Consulted and Agree with Plan of Care Patient      Patient will benefit from skilled therapeutic intervention in order to improve the following deficits and impairments:   Cognitive communication deficit - Plan: SLP plan of care cert/re-cert    Problem List Patient Active Problem List   Diagnosis Date Noted  . Open nasal fracture 05/29/2016  . Facial laceration 05/29/2016  . Blunt head trauma 05/29/2016  . SIADH (syndrome of inappropriate ADH production) ([facility]) 05/29/2016  . TBI (traumatic brain injury) ([facility]) 05/17/2016  . Rupture of plantar fascia 08/19/2015  . Degenerative tear of posterior horn of medial meniscus 06/15/2015  . Right hand pain 08/06/2013  . Leg length inequality 09/17/2012  . Pain, joint, ankle and foot 09/26/2011    [name], Radene Journey MS, CCC-SLP 07/20/2016, 3:14 PM  [facility] [address] [place], [place], [address] Phone: [phone]   Fax:  [phone]  Name: Annette Kidd MRN: [id number] Date of Birth:  1955-05-16

## 2016-07-20 NOTE — Therapy (Signed)
[facility] [address] [place], [place], [address] Phone: [phone]   Fax:  [phone]  Physical Therapy Treatment  Patient Details  Name: TERISHA LOSASSO MRN: [id number] Date of Birth: 08-30-1955 Referring Provider: Freeman Caldron, PA-C  Encounter Date: 07/20/2016      PT End of Session - 07/20/16 1202    Visit Number 13   Number of Visits 16   Date for PT Re-Evaluation 08/05/16   Authorization Type [employer]-60 visits combined PT/OT   PT Start Time 1108   PT Stop Time 1150   PT Time Calculation (min) 42 min   Activity Tolerance Patient tolerated treatment well   Behavior During Therapy [facility] for tasks assessed/performed      History reviewed. No pertinent past medical history.  Past Surgical History:  Procedure Laterality Date  . APPENDECTOMY      There were no vitals filed for this visit.      Subjective Assessment - 07/20/16 1112    Subjective Still having intermittent dizziness with habituation exercises, especially with quick head movements.  Still having anxiety about driving but will need to for work soon.  Starts speech therapy today.   Patient is accompained by: Family member   Pertinent History *patient reports that she had an episode of 8/10 spinning dizziness approximately 15 years ago due to bacterial infection.     Limitations Other (comment);Walking;Standing   Patient Stated Goals Walk longer and return to regular activities-yoga, typing, gardening, hiking, fitness when cleared by MD   Currently in Pain? No/denies           [facility] Adult PT Treatment/Exercise - 07/20/16 1155      Knee/Hip Exercises: Aerobic   Tread Mill 2.7 mph, no UE support adding in vertical and horizontal head turns x 10 minutes     Knee/Hip Exercises: Standing   Lunge Walking - Round Trips 25' forwards x 2 reps with head turns to L and R while holding 5 lb hand weight with supervision         Vestibular  Treatment/Exercise - 07/20/16 1159      Vestibular Treatment/Exercise   Vestibular Treatment Provided Gaze   Gaze Exercises X1 Viewing Horizontal;X1 Viewing Vertical     X1 Viewing Horizontal   Foot Position walking forwards and retro first holding letter against busy background, blank back ground and then changing to having letter taped to door due to significant LOB/dizziness while holding letter   Reps 5   Comments 20 feet forwards and backwards     X1 Viewing Vertical   Foot Position walking forwards and retro first holding letter against busy background, blank back ground and then changing to having letter taped to door due to significant LOB/dizziness while holding letter   Reps 5   Comments 20 feet forwards and backwards           PT Education - 07/20/16 1201    Education provided Yes   Education Details HEP   Person(s) Educated Patient   Methods Explanation   Comprehension Verbalized understanding;Returned demonstration          PT Short Term Goals - 07/06/16 1704      PT SHORT TERM GOAL #1   Title  (TARGET DATE FOR ALL STG 07/05/16) pt will be independent with HEP   Status Achieved     PT SHORT TERM GOAL #2   Title will demonstrate safe floor <> furniture transfer (for gardening/yoga) independently   Status Achieved  PT SHORT TERM GOAL #3   Title will ambulate >1000' over uneven surfaces outdoors with supervision and report symptoms of pain or dizziness as <2/10   Status Unable to assess     PT SHORT TERM GOAL #4   Title Will negotiate 12 stairs alternating sequence, no UE at supervision level   Baseline requires one UE support for 12 stairs or supervision for no UE support   Status Achieved     PT SHORT TERM GOAL #5   Title Will complete DGI and LTG set   Baseline FGA performed 06/13/16   Status Achieved           PT Long Term Goals - 07/17/16 1222      PT LONG TERM GOAL #1   Title (TARGET DATE FOR ALL LTG 08/05/16) will report symptoms of  pain, dizziness, fatigue of <2 after participating in one full yoga class   Status On-going     PT LONG TERM GOAL #2   Title Will improve functional leg strength as indicated by a decrease in 5 times sit <> stand time by 4 seconds. (6 seconds)   Baseline 10 sec   Status On-going     PT LONG TERM GOAL #3   Title Pt will demonstrate improved balance and independence with community gait as indicated by gait velocity increasing to >4 ft/sec   Baseline 3.28 ft/sec   Status On-going     PT LONG TERM GOAL #5   Title improve FGA to >/= 28/30 for improved functional mobility   Status On-going           Plan - 07/20/16 1205    Clinical Impression Statement Pt continues to make good progress but continues to report unsteadiness with more dynamic, faster movements.  Focus of treatment session on faster gait speeds + head turns and more dynamic movement with x 1 viewing and head turns.  Will continue to address to reach targeted goals within the next couple of weeks.    PT Treatment/Interventions ADLs/Self Care Home Management;Canalith Repostioning;Moist Heat;Gait training;Stair training;Functional mobility training;Therapeutic activities;Therapeutic exercise;Balance training;Neuromuscular re-education;Cognitive remediation;Patient/family education;Manual techniques;Passive range of motion;Energy conservation;Taping;Vestibular   PT Next Visit Plan aerobic, more dynamic movements with x 1 viewing/x 2 viewing (gait, lunges, squats, weighted chops)   Consulted and Agree with Plan of Care Patient      Patient will benefit from skilled therapeutic intervention in order to improve the following deficits and impairments:  Decreased activity tolerance, Decreased balance, Decreased cognition, Decreased endurance, Decreased strength, Difficulty walking, Dizziness, Pain  Visit Diagnosis: Unsteadiness on feet  Dizziness and giddiness  Difficulty in walking, not elsewhere classified     Problem  List Patient Active Problem List   Diagnosis Date Noted  . Open nasal fracture 05/29/2016  . Facial laceration 05/29/2016  . Blunt head trauma 05/29/2016  . SIADH (syndrome of inappropriate ADH production) (HCC) 05/29/2016  . TBI (traumatic brain injury) (HCC) 05/17/2016  . Rupture of plantar fascia 08/19/2015  . Degenerative tear of posterior horn of medial meniscus 06/15/2015  . Right hand pain 08/06/2013  . Leg length inequality 09/17/2012  . Pain, joint, ankle and foot 09/26/2011   Edman CircleAudra Hall, PT, DPT 07/20/16    12:17 PM    [facility] [address] [place], [place], [address] Phone: [phone]   Fax:  [phone]  Name: Sherrilee GillesLee P Papandrea MRN: [id number] Date of Birth: [birth year]

## 2016-07-20 NOTE — Patient Instructions (Signed)
   Cognitive Activities you can do at home:   - Solitaire  - Majong  - Scrabble  - Chess/Checkers  - Crosswords (easy level)  - Juanna CaoUno  - Card Games  - Board Games  - Connect 4  - Simon  - the Memory Game  - dominoes  - Backgammon  On your computer, tablet or phone: Geophysical data processorBrainHQ [email or web address] Secretary/administratoreuronation App Memory Match Game App Rush Hour Chocolate Fix Scrabble pics App Photo Quiz App Mix two What's the Word?

## 2016-07-21 ENCOUNTER — Ambulatory Visit: Payer: Managed Care, Other (non HMO)

## 2016-07-24 ENCOUNTER — Encounter: Payer: Self-pay | Admitting: Physical Therapy

## 2016-07-24 ENCOUNTER — Ambulatory Visit: Payer: Managed Care, Other (non HMO) | Admitting: Physical Therapy

## 2016-07-24 DIAGNOSIS — R42 Dizziness and giddiness: Secondary | ICD-10-CM

## 2016-07-24 DIAGNOSIS — R2681 Unsteadiness on feet: Secondary | ICD-10-CM

## 2016-07-24 DIAGNOSIS — R262 Difficulty in walking, not elsewhere classified: Secondary | ICD-10-CM

## 2016-07-24 NOTE — Therapy (Signed)
[facility] [address] [place], [place], [address] Phone: [phone]   Fax:  [phone]  Physical Therapy Treatment  Patient Details  Name: Annette Kidd MRN: [id number] Date of Birth: 1956/04/07 Referring Provider: Freeman CaldronMichael J Jeffery, PA-C  Encounter Date: 07/24/2016      PT End of Session - 07/24/16 1157    Visit Number 14   Number of Visits 16   Date for PT Re-Evaluation 08/05/16   Authorization Type [employer]-60 visits combined PT/OT   PT Start Time 1100   PT Stop Time 1148   PT Time Calculation (min) 48 min   Activity Tolerance Patient tolerated treatment well   Behavior [facility] Therapy [facility] for tasks assessed/performed      History reviewed. No pertinent past medical history.  Past Surgical History:  Procedure Laterality Date  . APPENDECTOMY      There were no vitals filed for this visit.      Subjective Assessment - 07/24/16 1101    Subjective Pt reports getting extra rest this weekend and [place] with [place] issues.  Pt reports feeling better yesterday with extra rest.     Patient is accompained by: Family member   Pertinent History *patient reports that she had an episode of 8/10 spinning dizziness approximately 15 years ago due to bacterial infection.     Patient Stated Goals Walk longer and return to regular activities-yoga, typing, gardening, hiking, fitness when cleared by MD   Currently in Pain? No/denies                         [facility] Adult PT Treatment/Exercise - 07/24/16 1111      Knee/Hip Exercises: Aerobic   Tread Mill 3.0 mph x 8 minutes without UE support with vertical, horizontal and diagonal head turns.     Knee/Hip Exercises: Standing   Side Lunges Other (comment)   Side Lunges Limitations x 20' x 4 reps to L and R with bicep curnl and shoulder press with head nods   Functional Squat 10 reps   Functional Squat Limitations with diagonal  chops holding 5lb, 2 sets x 10 reps to L and R   Lunge Walking - Round Trips 20' x 4 reps holding 10 lbs and performing lateral head turns         Vestibular Treatment/Exercise - 07/24/16 1155      Vestibular Treatment/Exercise   Vestibular Treatment Provided Gaze   Gaze Exercises Comment  x 2 viewing diagonal L and R standing, blank background               PT Education - 07/24/16 1156    Education provided Yes   Education Details strengthening/endurance activities for AT&THEP   Person(s) Educated Patient   Methods Explanation;Demonstration   Comprehension Verbalized understanding;Returned demonstration          PT Short Term Goals - 07/06/16 1704      PT SHORT TERM GOAL #1   Title  (TARGET DATE FOR ALL STG 07/05/16) pt will be independent with HEP   Status Achieved     PT SHORT TERM GOAL #2   Title will demonstrate safe floor <> furniture transfer (for gardening/yoga) independently   Status Achieved     PT SHORT TERM GOAL #3   Title will ambulate >1000' over uneven surfaces outdoors with supervision and report symptoms of pain or dizziness as <2/10   Status Unable to assess  PT SHORT TERM GOAL #4   Title Will negotiate 12 stairs alternating sequence, no UE at supervision level   Baseline requires one UE support for 12 stairs or supervision for no UE support   Status Achieved     PT SHORT TERM GOAL #5   Title Will complete DGI and LTG set   Baseline FGA performed 06/13/16   Status Achieved           PT Long Term Goals - 07/17/16 1222      PT LONG TERM GOAL #1   Title (TARGET DATE FOR ALL LTG 08/05/16) will report symptoms of pain, dizziness, fatigue of <2 after participating in one full yoga class   Status On-going     PT LONG TERM GOAL #2   Title Will improve functional leg strength as indicated by a decrease in 5 times sit <> stand time by 4 seconds. (6 seconds)   Baseline 10 sec   Status On-going     PT LONG TERM GOAL #3   Title Pt will  demonstrate improved balance and independence with community gait as indicated by gait velocity increasing to >4 ft/sec   Baseline 3.28 ft/sec   Status On-going     PT LONG TERM GOAL #5   Title improve FGA to >/= 28/30 for improved functional mobility   Status On-going               Plan - 07/24/16 1201    Clinical Impression Statement Session with continued focus on gaze stability and balance during aerobic activity and dynamic activity with head movements; pt introduced to diagonal head movements during gait and various strengthening exercises as well as during x 2 viewing  Will continue to address to reach targeted goals and prepare pt for return to job duties and community activities.     PT Treatment/Interventions ADLs/Self Care Home Management;Canalith Repostioning;Moist Heat;Gait training;Stair training;Functional mobility training;Therapeutic activities;Therapeutic exercise;Balance training;Neuromuscular re-education;Cognitive remediation;Patient/family education;Manual techniques;Passive range of motion;Energy conservation;Taping;Vestibular   PT Next Visit Plan aerobic, more dynamic movements with x 1 viewing/x 2 viewing increase visual flow, dual cognitive tasks during activity   Consulted and Agree with Plan of Care Patient      Patient will benefit from skilled therapeutic intervention in order to improve the following deficits and impairments:  Decreased activity tolerance, Decreased balance, Decreased cognition, Decreased endurance, Decreased strength, Difficulty walking, Dizziness, Pain  Visit Diagnosis: Unsteadiness on feet  Dizziness and giddiness  Difficulty in walking, not elsewhere classified     Problem List Patient Active Problem List   Diagnosis Date Noted  . Open nasal fracture 05/29/2016  . Facial laceration 05/29/2016  . Blunt head trauma 05/29/2016  . SIADH (syndrome of inappropriate ADH production) (HCC) 05/29/2016  . TBI (traumatic brain  injury) (HCC) 05/17/2016  . Rupture of plantar fascia 08/19/2015  . Degenerative tear of posterior horn of medial meniscus 06/15/2015  . Right hand pain 08/06/2013  . Leg length inequality 09/17/2012  . Pain, joint, ankle and foot 09/26/2011    Edman Circle, PT, DPT 07/24/16    12:12 PM    [facility] [address] [place], [place], [address] Phone: [phone]   Fax:  [phone]  Name: Annette Kidd MRN: [id number] Date of Birth: June 03, 1955

## 2016-07-25 ENCOUNTER — Ambulatory Visit: Payer: Managed Care, Other (non HMO) | Admitting: Speech Pathology

## 2016-07-25 DIAGNOSIS — R41841 Cognitive communication deficit: Secondary | ICD-10-CM

## 2016-07-25 DIAGNOSIS — R2681 Unsteadiness on feet: Secondary | ICD-10-CM | POA: Diagnosis not present

## 2016-07-25 NOTE — Therapy (Signed)
[facility] [address] [place], [place], [address] Phone: [phone]   Fax:  [phone]  Speech Language Pathology Treatment  Patient Details  Name: Annette Kidd MRN: [id number] Date of Birth: 1956/04/27 Referring Provider: Dr. Barnett Abu  Encounter Date: 07/25/2016      End of Session - 07/25/16 1350    Visit Number 2   Number of Visits 12   Date for SLP Re-Evaluation 08/31/16   SLP Start Time 1320   SLP Stop Time  1402   SLP Time Calculation (min) 42 min   Activity Tolerance Patient tolerated treatment well      No past medical history on file.  Past Surgical History:  Procedure Laterality Date  . APPENDECTOMY      There were no vitals filed for this visit.      Subjective Assessment - 07/25/16 1326    Subjective "I still have the dizziness"   Currently in Pain? No/denies               ADULT SLP TREATMENT - 07/25/16 1326      General Information   Behavior/Cognition Alert;Cooperative;Pleasant mood     Treatment Provided   Treatment provided Cognitive-Linquistic     Pain Assessment   Pain Assessment No/denies pain     Cognitive-Linquistic Treatment   Treatment focused on Cognition   Skilled Treatment Facilitated divided attention between moderately complex deduction puzzle and simple conversation with occasional min repetition of conversation.  Verbal expression/word finding facilitated with generation of multiple meaning sentences with 3-4 meanings per word with rare min A and minimal extended time.      Assessment / Recommendations / Plan   Plan Continue with current plan of care     Progression Toward Goals   Progression toward goals Progressing toward goals              SLP Long Term Goals - 07/25/16 1350      SLP LONG TERM GOAL #1   Title Pt will divide attention betwen 2 moderately complex cognitive linguistic tasks with 90% on each and rare min A   Time 4   Period Weeks   Status On-going     SLP LONG TERM GOAL #2   Title Pt will perform complex attention to detail tasks with 90% accuracy and rare min A   Time 4   Period Weeks   Status On-going     SLP LONG TERM GOAL #3   Title Pt will complete complex naming tasks with rare min A   Time 4   Period Weeks   Status New          Plan - 07/25/16 1344    Clinical Impression Statement Pt required min A for simple divided attention and word finding. Continue skilled ST to maximize high level cognition for success at work.   Speech Therapy Frequency 2x / week   Treatment/Interventions Cognitive reorganization;Internal/external aids;SLP instruction and feedback;Functional tasks;Patient/family education;Compensatory strategies;Language facilitation   Potential to Achieve Goals Good   Consulted and Agree with Plan of Care Patient      Patient will benefit from skilled therapeutic intervention in order to improve the following deficits and impairments:   Cognitive communication deficit    Problem List Patient Active Problem List   Diagnosis Date Noted  . Open nasal fracture 05/29/2016  . Facial laceration 05/29/2016  . Blunt head trauma 05/29/2016  . SIADH (syndrome of inappropriate ADH production) (HCC) 05/29/2016  .  TBI (traumatic brain injury) (HCC) 05/17/2016  . Rupture of plantar fascia 08/19/2015  . Degenerative tear of posterior horn of medial meniscus 06/15/2015  . Right hand pain 08/06/2013  . Leg length inequality 09/17/2012  . Pain, joint, ankle and foot 09/26/2011    Iantha Titsworth, Radene JourneyLaura Ann MS, CCC-SLP 07/25/2016, 2:05 PM  [facility] [address] [place], [place], [address] Phone: [phone]   Fax:  [phone]   Name: Annette Kidd MRN: [id number] Date of Birth: Jul 01, 1955

## 2016-07-27 ENCOUNTER — Ambulatory Visit: Payer: Managed Care, Other (non HMO) | Admitting: Physical Therapy

## 2016-07-27 ENCOUNTER — Encounter: Payer: Self-pay | Admitting: Physical Therapy

## 2016-07-27 ENCOUNTER — Ambulatory Visit: Payer: Managed Care, Other (non HMO) | Admitting: Speech Pathology

## 2016-07-27 DIAGNOSIS — R2681 Unsteadiness on feet: Secondary | ICD-10-CM

## 2016-07-27 DIAGNOSIS — R42 Dizziness and giddiness: Secondary | ICD-10-CM

## 2016-07-27 DIAGNOSIS — R262 Difficulty in walking, not elsewhere classified: Secondary | ICD-10-CM

## 2016-07-27 DIAGNOSIS — R41844 Frontal lobe and executive function deficit: Secondary | ICD-10-CM

## 2016-07-27 DIAGNOSIS — R41841 Cognitive communication deficit: Secondary | ICD-10-CM

## 2016-07-27 NOTE — Therapy (Signed)
[facility] [address] [place], [place], [address] Phone: [phone]   Fax:  [phone]  Physical Therapy Treatment  Patient Details  Name: Annette Kidd MRN: [id number] Date of Birth: Oct 15, 1955 Referring Provider: Freeman Caldron, PA-C  Encounter Date: 07/27/2016      PT End of Session - 07/27/16 1157    Visit Number 15   Number of Visits 16   Date for PT Re-Evaluation 08/05/16   Authorization Type [employer]-60 visits combined PT/OT   PT Start Time 1108   PT Stop Time 1146   PT Time Calculation (min) 38 min   Activity Tolerance Patient tolerated treatment well   Behavior During Therapy [facility] for tasks assessed/performed      History reviewed. No pertinent past medical history.  Past Surgical History:  Procedure Laterality Date  . APPENDECTOMY      There were no vitals filed for this visit.      Subjective Assessment - 07/27/16 1105    Subjective Pt continues to report some days of dizziness and some days without.  Asking questions and still has concerns about return to work and return to driving.  Biggest hurdle to return to work is strength and stamina and biggest hurdle to exercising in community setting is not being able to drive or have adequate transportation.     Pertinent History *patient reports that she had an episode of 8/10 spinning dizziness approximately 15 years ago due to bacterial infection.     Limitations Other (comment);Walking;Standing   Patient Stated Goals Walk longer and return to regular activities-yoga, typing, gardening, hiking, fitness when cleared by MD          [facility] Adult PT Treatment/Exercise - 07/27/16 1151      Ambulation/Gait   Ambulation/Gait Yes   Ambulation/Gait Assistance 5: Supervision   Ambulation/Gait Assistance Details Gait training outside on unlevel grassy terrain uphill/down hill performing changes in gait speed, head turns, walking lunges, lateral squats  >2,000 feet with no LOB.     Ambulation Distance (Feet) 2000 Feet   Assistive device None   Ambulation Surface Unlevel;Outdoor;Grass             Balance Exercises - 07/27/16 1152      Balance Exercises: Standing   Other Standing Exercises Performed Gerlene Fee and warrior <> triangle <> exalted warrior poses x 3 reps with supervision with minimal c/o dizziness when transitioning from warrior > triangle pose.             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  Also discussed continuing POC after next week x 4 weeks, 1x/week.   Person(s) Educated Patient   Methods Explanation   Comprehension Verbalized understanding          PT Short Term Goals - 07/06/16 1704      PT SHORT TERM GOAL #1   Title  (TARGET DATE FOR ALL STG 07/05/16) pt will be independent with HEP   Status Achieved     PT SHORT TERM GOAL #2   Title will demonstrate safe floor <> furniture transfer (for gardening/yoga) independently   Status Achieved     PT SHORT TERM GOAL #3   Title will ambulate >1000' over uneven surfaces outdoors with supervision and report symptoms of  pain or dizziness as <2/10   Status Unable to assess     PT SHORT TERM GOAL #4   Title Will negotiate 12 stairs alternating sequence, no UE at supervision level   Baseline requires one UE support for 12 stairs or supervision for no UE support   Status Achieved     PT SHORT TERM GOAL #5   Title Will complete DGI and LTG set   Baseline FGA performed 06/13/16   Status Achieved           PT Long Term Goals - 07/17/16 1222      PT LONG TERM GOAL #1   Title (TARGET DATE FOR ALL LTG 08/05/16) will report symptoms of pain, dizziness, fatigue of <2 after participating in one full yoga class    Status On-going     PT LONG TERM GOAL #2   Title Will improve functional leg strength as indicated by a decrease in 5 times sit <> stand time by 4 seconds. (6 seconds)   Baseline 10 sec   Status On-going     PT LONG TERM GOAL #3   Title Pt will demonstrate improved balance and independence with community gait as indicated by gait velocity increasing to >4 ft/sec   Baseline 3.28 ft/sec   Status On-going     PT LONG TERM GOAL #5   Title improve FGA to >/= 28/30 for improved functional mobility   Status On-going               Plan - 07/27/16 1157    Clinical Impression Statement Session initiated with long discussion about POC and return to work and normal activities.  Pt encouraged to begin normal activities outside and with driving with supervision and for short periods of time with rest breaks for energy conservation.  Discussed that therapy 1-2 times a week was not sufficient to build stamina and strength and pt needs to return to community wellness as well.  Rest of session focused on assessing pt balance and tolerance to yoga sequences and higher level gait outside on uneven terrain.  Will continue to address and will re-assess LTG next week.     PT Treatment/Interventions ADLs/Self Care Home Management;Canalith Repostioning;Moist Heat;Gait training;Stair training;Functional mobility training;Therapeutic activities;Therapeutic exercise;Balance training;Neuromuscular re-education;Cognitive remediation;Patient/family education;Manual techniques;Passive range of motion;Energy conservation;Taping;Vestibular   PT Next Visit Plan re-assess LTG, set new LTG and recert for 1x/week x 4 more weeks.  Continue higher level gait/balance, aerobic and strengthening increasing visual flow and dual task, work simulated activities   Consulted and Agree with Plan of Care Patient      Patient will benefit from skilled therapeutic intervention in order to improve the following deficits and  impairments:  Decreased activity tolerance, Decreased balance, Decreased cognition, Decreased endurance, Decreased strength, Difficulty walking, Dizziness, Pain  Visit Diagnosis: Unsteadiness on feet  Dizziness and giddiness  Difficulty in walking, not elsewhere classified     Problem List Patient Active Problem List   Diagnosis Date Noted  . Open nasal fracture 05/29/2016  . Facial laceration 05/29/2016  . Blunt head trauma 05/29/2016  . SIADH (syndrome of inappropriate ADH production) (HCC) 05/29/2016  . TBI (traumatic brain injury) (HCC) 05/17/2016  . Rupture of plantar fascia 08/19/2015  . Degenerative tear of posterior horn of medial meniscus 06/15/2015  . Right hand pain 08/06/2013  . Leg length inequality 09/17/2012  . Pain, joint, ankle and foot 09/26/2011   Edman Circle, PT, DPT 07/27/16    12:02 PM  [facility] [address] [place], [place], [address] Phone: [phone]   Fax:  [phone]  Name: Sherrilee GillesLee P Danielsen MRN: [id number] Date of Birth: 04/23/1956

## 2016-07-27 NOTE — Therapy (Signed)
[facility] [address] [place], [place], [address] Phone: [phone]   Fax:  [phone]  Speech Language Pathology Treatment  Patient Details  Name: Annette Kidd MRN: [id number] Date of Birth: 07-Oct-1955 Referring Provider: Dr. Kristeen Miss  Encounter Date: 07/27/2016      End of Session - 07/27/16 1444    Visit Number 3   Number of Visits 12   Date for SLP Re-Evaluation 08/31/16   SLP Start Time 6962   SLP Stop Time  1105   SLP Time Calculation (min) 47 min   Activity Tolerance Patient tolerated treatment well      No past medical history on file.  Past Surgical History:  Procedure Laterality Date  . APPENDECTOMY      There were no vitals filed for this visit.      Subjective Assessment - 07/27/16 1025    Subjective "Some people say "Why don't you retire?" but that would be boring."   Currently in Pain? No/denies               ADULT SLP TREATMENT - 07/27/16 1015      General Information   Behavior/Cognition Alert;Cooperative;Pleasant mood   Patient Positioning Upright in chair   Oral care provided N/A     Treatment Provided   Treatment provided Cognitive-Linquistic     Pain Assessment   Pain Assessment No/denies pain     Cognitive-Linquistic Treatment   Treatment focused on Cognition   Skilled Treatment Facilitated divided attention between moderately complex deduction puzzle and simple conversation with occasional min repetition of conversation. 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 SLP provided suggestions for pt to practice at home; she states, "I'm thinking I should take a landscape architecture class." States she does not feel ready yet. SLP provided education re: working up to courses, engaging in functional tasks at home in the meantime. Pt frequently tangential in conversation, requires  moderate verbal redirection to topic. Encouraged pt to reflect on how her attention is impacting conversations; she states "I don't feel as in control." States she has met with 3 clients, did use visual cues to "bring myself back" to topic. Encouraged pt to use written and visual reminders to help her maintain focus.     Assessment / Recommendations / Plan   Plan Continue with current plan of care     Progression Toward Goals   Progression toward goals Progressing toward goals          SLP Education - 07/27/16 1443    Education provided Yes   Education Details attention/ memory strategies   Person(s) Educated Patient   Methods Explanation;Handout   Comprehension Verbalized understanding            SLP Long Term Goals - 07/27/16 1445      SLP LONG TERM GOAL #1   Title Pt will divide attention betwen 2 moderately complex cognitive linguistic tasks with 90% on each and rare min A   Time 4   Period Weeks   Status On-going     SLP LONG TERM GOAL #2   Title Pt will perform complex attention to detail tasks with 90% accuracy and rare min A   Time 4   Period Weeks   Status On-going     SLP LONG TERM GOAL #3   Title Pt will complete complex naming tasks with  rare min A   Time 4   Period Weeks   Status On-going          Plan - 07/27/16 1445    Clinical Impression Statement Pt required min A for simple divided attention and word finding. Required moderate cues for tangential conversation, topic maintenance. Continue skilled ST to maximize high level cognition for success at work.   Speech Therapy Frequency 2x / week   Treatment/Interventions Cognitive reorganization;Internal/external aids;SLP instruction and feedback;Functional tasks;Patient/family education;Compensatory strategies;Language facilitation   Potential to Achieve Goals Good   SLP Home Exercise Plan homework assigned   Consulted and Agree with Plan of Care Patient      Patient will benefit from skilled  therapeutic intervention in order to improve the following deficits and impairments:   Cognitive communication deficit  Frontal lobe and executive function deficit    Problem List Patient Active Problem List   Diagnosis Date Noted  . Open nasal fracture 05/29/2016  . Facial laceration 05/29/2016  . Blunt head trauma 05/29/2016  . SIADH (syndrome of inappropriate ADH production) ([place]) 05/29/2016  . TBI (traumatic brain injury) ([place]) 05/17/2016  . Rupture of plantar fascia 08/19/2015  . Degenerative tear of posterior horn of medial meniscus 06/15/2015  . Right hand pain 08/06/2013  . Leg length inequality 09/17/2012  . Pain, joint, ankle and foot 09/26/2011   Deneise Lever, MS CF-SLP Speech-Language Pathologist  Aliene Altes 07/27/2016, 2:47 PM  [facility] [address] [place], [place], [address] Phone: [phone]   Fax:  [phone]   Name: JOHNIECE HORNBAKER MRN: [id number] Date of Birth: 1955/08/12

## 2016-07-31 ENCOUNTER — Ambulatory Visit: Payer: Managed Care, Other (non HMO) | Admitting: Physical Therapy

## 2016-07-31 ENCOUNTER — Encounter: Payer: Self-pay | Admitting: Physical Therapy

## 2016-07-31 DIAGNOSIS — R262 Difficulty in walking, not elsewhere classified: Secondary | ICD-10-CM

## 2016-07-31 DIAGNOSIS — R2681 Unsteadiness on feet: Secondary | ICD-10-CM

## 2016-07-31 DIAGNOSIS — R42 Dizziness and giddiness: Secondary | ICD-10-CM

## 2016-07-31 NOTE — Therapy (Signed)
[facility] [address] [place], [place], [address] Phone: [phone]   Fax:  [phone]  Physical Therapy Treatment  Patient Details  Name: Annette Kidd MRN: [id number] Date of Birth: February 14, 1956 Referring Provider: Lisette Abu, PA-C  Encounter Date: 07/31/2016      PT End of Session - 07/31/16 1243    Visit Number 16   Number of Visits 17  1 more this week then will recert for 4 more   Date for PT Re-Evaluation 08/05/16   Authorization Type [employer]-60 visits combined PT/OT   PT Start Time 1100   PT Stop Time 1147   PT Time Calculation (min) 47 min   Activity Tolerance Patient tolerated treatment well   Behavior During Therapy [facility] for tasks assessed/performed      History reviewed. No pertinent past medical history.  Past Surgical History:  Procedure Laterality Date  . APPENDECTOMY      There were no vitals filed for this visit.      Subjective Assessment - 07/31/16 1112    Subjective Pt had a busy weekend; did practice going to a parking lot and driving with no issues.  Also discussed yoga with a surgeon friend who said to avoid positions with head inverted.     Pertinent History *patient reports that she had an episode of 8/10 spinning dizziness approximately 15 years ago due to bacterial infection.     Limitations Other (comment);Walking;Standing   Patient Stated Goals Walk longer and return to regular activities-yoga, typing, gardening, hiking, fitness when cleared by MD   Currently in Pain? No/denies            [facility] PT Assessment - 07/31/16 1115      Standardized Balance Assessment   Standardized Balance Assessment Five Times Sit to Stand;Dynamic Gait Index;10 meter walk test   Five times sit to stand comments  6 seconds   10 Meter Walk 6.84 seconds or 4.8 ft/sec self selected and fast walking speed 5.94 seconds or 5.52 ft/sec     Functional Gait  Assessment   Gait assessed  Yes   Gait Level Surface Walks 20 ft in less than 5.5 sec, no assistive devices, good speed, no evidence for imbalance, normal gait pattern, deviates no more than 6 in outside of the 12 in walkway width.   Change in Gait Speed Able to smoothly change walking speed without loss of balance or gait deviation. Deviate no more than 6 in outside of the 12 in walkway width.   Gait with Horizontal Head Turns Performs head turns smoothly with slight change in gait velocity (eg, minor disruption to smooth gait path), deviates 6-10 in outside 12 in walkway width, or uses an assistive device.   Gait with Vertical Head Turns Performs task with slight change in gait velocity (eg, minor disruption to smooth gait path), deviates 6 - 10 in outside 12 in walkway width or uses assistive device   Gait and Pivot Turn Pivot turns safely within 3 sec and stops quickly with no loss of balance.   Step Over Obstacle Is able to step over 2 stacked shoe boxes taped together (9 in total height) without changing gait speed. No evidence of imbalance.   Gait with Narrow Base of Support Is able to ambulate for 10 steps heel to toe with no staggering.   Gait with Eyes Closed Walks 20 ft, no assistive devices, good speed, no evidence of imbalance, normal gait pattern, deviates no more than  6 in outside 12 in walkway width. Ambulates 20 ft in less than 7 sec.   Ambulating Backwards Walks 20 ft, uses assistive device, slower speed, mild gait deviations, deviates 6-10 in outside 12 in walkway width.   Steps Alternating feet, no rail.   Total Score 27              PT Education - 07/31/16 1242    Education provided Yes   Education Details Progress, goals achieved, areas of continued focus and POC   Person(s) Educated Patient   Methods Explanation   Comprehension Verbalized understanding          PT Short Term Goals - 07/06/16 1704      PT SHORT TERM GOAL #1   Title  (TARGET DATE FOR ALL STG 07/05/16) pt will be independent with  HEP   Status Achieved     PT SHORT TERM GOAL #2   Title will demonstrate safe floor <> furniture transfer (for gardening/yoga) independently   Status Achieved     PT [place] #3   Title will ambulate >1000' over uneven surfaces outdoors with supervision and report symptoms of pain or dizziness as <2/10   Status Unable to assess     PT SHORT TERM GOAL #4   Title Will negotiate 12 stairs alternating sequence, no UE at supervision level   Baseline requires one UE support for 12 stairs or supervision for no UE support   Status Achieved     PT SHORT TERM GOAL #5   Title Will complete DGI and LTG set   Baseline FGA performed 06/13/16   Status Achieved           PT Long Term Goals - 07/31/16 1134      PT LONG TERM GOAL #1   Title (TARGET DATE FOR ALL LTG 08/05/16) will report symptoms of pain, dizziness, fatigue of <2 after participating in one full yoga class   Status On-going     PT LONG TERM GOAL #2   Title Will improve functional leg strength as indicated by a decrease in 5 times sit <> stand time by 4 seconds. (6 seconds)   Baseline 6 seconds-GOAL MET 07/31/16   Status Achieved     PT LONG TERM GOAL #3   Title Pt will demonstrate improved balance and independence with community gait as indicated by gait velocity increasing to >4 ft/sec   Baseline 4.8 ft/sec-GOAL MET 07/31/16   Status Achieved     PT LONG TERM GOAL #5   Title improve FGA to >/= 28/30 for improved functional mobility   Baseline 27/30 on 07/31/16   Status Partially Met     Additional Long Term Goals   Additional Long Term Goals Yes     PT LONG TERM GOAL #6   Title Pt will demonstrate ability to squat and bend down to pick up and carry 20lb weighted box >100' (up/down ramp/curb) Mod I (to simulate work requirements)   Time 4   Period Weeks   Status New     PT LONG TERM GOAL #7   Title Pt will complete 6 minute walk test outside over uneven pavement/grassy terrain with LTG to be set.   Baseline TBD    Status New               Plan - 07/31/16 1247    Clinical Impression Statement Initiated assessment of LTG with pt demonstrating improvements in LE strength as indicated by improvement in 5  times sit <> stand and gait velocity; pt still demonstrates balance deficits with higher level gait challenges.  Plan to assess endurance and balance over uneven surfaces outdoors with 6 min walk test outside over uneven grassy terrain next visit and recertify for 4-5 more visits to continue to address balance, endurance and strength deficits as pt returns to gradual work schedule.     Rehab Potential Good   PT Frequency 2x / week   PT Duration 8 weeks   PT Treatment/Interventions ADLs/Self Care Home Management;Canalith Repostioning;Moist Heat;Gait training;Stair training;Functional mobility training;Therapeutic activities;Therapeutic exercise;Balance training;Neuromuscular re-education;Cognitive remediation;Patient/family education;Manual techniques;Passive range of motion;Energy conservation;Taping;Vestibular   PT Next Visit Plan Perform 6 min walk test of endurance outside over pavement and grass-set LTG; recert for 1x/week x 4 weeks.  Other goals have been checked and updated   Consulted and Agree with Plan of Care Patient      Patient will benefit from skilled therapeutic intervention in order to improve the following deficits and impairments:  Decreased activity tolerance, Decreased balance, Decreased cognition, Decreased endurance, Decreased strength, Difficulty walking, Dizziness, Pain  Visit Diagnosis: Unsteadiness on feet  Dizziness and giddiness  Difficulty in walking, not elsewhere classified     Problem List Patient Active Problem List   Diagnosis Date Noted  . Open nasal fracture 05/29/2016  . Facial laceration 05/29/2016  . Blunt head trauma 05/29/2016  . SIADH (syndrome of inappropriate ADH production) ([place]) 05/29/2016  . TBI (traumatic brain injury) ([place]) 05/17/2016  .  Rupture of plantar fascia 08/19/2015  . Degenerative tear of posterior horn of medial meniscus 06/15/2015  . Right hand pain 08/06/2013  . Leg length inequality 09/17/2012  . Pain, joint, ankle and foot 09/26/2011    Raylene Everts, PT, DPT 07/31/16    12:53 PM    [facility] [address] [place], [place], [address] Phone: [phone]   Fax:  [phone]  Name: Annette Kidd MRN: [id number] Date of Birth: 04/24/1956

## 2016-08-03 ENCOUNTER — Ambulatory Visit: Payer: Managed Care, Other (non HMO) | Admitting: Physical Therapy

## 2016-08-03 ENCOUNTER — Encounter: Payer: Self-pay | Admitting: Physical Therapy

## 2016-08-03 DIAGNOSIS — R262 Difficulty in walking, not elsewhere classified: Secondary | ICD-10-CM

## 2016-08-03 DIAGNOSIS — R42 Dizziness and giddiness: Secondary | ICD-10-CM

## 2016-08-03 DIAGNOSIS — R2681 Unsteadiness on feet: Secondary | ICD-10-CM

## 2016-08-03 NOTE — Patient Instructions (Signed)
Gaze Stabilization - Tip Card  1.Target must remain in focus, not blurry, and appear stationary while head is in motion. 2.Perform exercises with small head movements (45 to either side of midline). 3.Increase speed of head motion so long as target is in focus. 4.If you wear eyeglasses, be sure you can see target through lens (therapist will give specific instructions for bifocal / progressive lenses). 5.These exercises may provoke dizziness or nausea. Work through these symptoms. If too dizzy, slow head movement slightly. Rest between each exercise. 6.Exercises demand concentration; avoid distractions. 7.For safety, perform standing exercises close to a counter, wall, corner, or next to someone.  Copyright  VHI. All rights reserved.   Gaze Stabilization - Standing Feet Apart   Feet shoulder width apart, keeping eyes on target on wall 3 to 6 feet away, tilt head down slightly and move head side to side for 60 seconds. Repeat while moving head up and down for 60 seconds. Do 2-3 sessions per day.  Letter on blank background: keep eyes on letter, move head side to side while walking forwards and backwards, repeat with head moving up and down Letter on busy background: stand still; move head and hands in opposite direction up/down, keep eyes on letter-performed x 30 seconds, repeat side to side.  Try diagonals x 10 reps if able.  Keep doing weighted exercises: walking lunges, side squats and diagonal chops x 10 each direction with head turns, 2 times a day  Feet Together (Compliant Surface) Head Motion - Eyes Closed    Stand on compliant surface: pillow with feet together. Close eyes and move head slowly, up and down 10 times and then side to side 10 times. Repeat 2 times per session. Do 2 sessions per day.  Copyright  VHI. All rights reserved.  Feet Partial Heel-Toe (Compliant Surface) Head Motion - Eyes Closed    Stand on compliant surface: one foot per pillow with right foot  partially in front of the other. Close eyes and move head slowly, up and down 10 times, side to side 10 times.   Place left foot partially in front of right foot.  Close eyes and move head slowly, up and down 10 times, side to side 10 times.   Repeat 2 times per session. Do 2 sessions per day.  Copyright  VHI. All rights reserved.

## 2016-08-03 NOTE — Therapy (Signed)
[facility] [address] [place], [place], [address] Phone: [phone]   Fax:  [phone]  Physical Therapy Treatment  Patient Details  Name: Annette Kidd MRN: [id number] Date of Birth: 11/17/1955 Referring Provider: Lisette Abu, PA-C  Encounter Date: 08/03/2016      PT End of Session - 08/03/16 1220    Visit Number 17   Number of Visits 22  per recertification   Date for PT Re-Evaluation 37/90/24  per recertification   Authorization Type [employer]-60 visits combined PT/OT   PT Start Time 1104   PT Stop Time 1154   PT Time Calculation (min) 50 min   Activity Tolerance Patient tolerated treatment well   Behavior During Therapy [facility] for tasks assessed/performed      History reviewed. No pertinent past medical history.  Past Surgical History:  Procedure Laterality Date  . APPENDECTOMY      There were no vitals filed for this visit.      Subjective Assessment - 08/03/16 1110    Subjective Pt had temporary tooth implant yesterday and c/o some mandibular pain today; took pain medication and is feeling a little dizzy today.  Would like to review HEP today.   Pertinent History *patient reports that she had an episode of 8/10 spinning dizziness approximately 15 years ago due to bacterial infection.     Limitations Other (comment);Walking;Standing   Patient Stated Goals Walk longer and return to regular activities-yoga, typing, gardening, hiking, fitness when cleared by MD   Currently in Pain? Yes   Pain Location Mouth   Pain Orientation Anterior   Pain Descriptors / Indicators Aching   Pain Type Acute pain  from tooth implant yesterday   Pain Onset Yesterday           [facility] PT Assessment - 08/03/16 1115      6 Minute Walk- Baseline   6 Minute Walk- Baseline yes   BP (mmHg) 116/77   HR (bpm) 72   02 Sat (%RA) 98 %   Modified Borg Scale for Dyspnea 0- Nothing at all   Perceived Rate of Exertion  (Borg) 7- Very, very light     6 Minute walk- Post Test   6 Minute Walk Post Test yes   BP (mmHg) 120/78   HR (bpm) 86   02 Sat (%RA) 99 %   Modified Borg Scale for Dyspnea 2- Mild shortness of breath   Perceived Rate of Exertion (Borg) 13- Somewhat hard     6 minute walk test results    Aerobic Endurance Distance Walked 2500   Endurance additional comments outside over grassy/uneven paved terrain           Vestibular Treatment/Exercise - 08/03/16 1216      Vestibular Treatment/Exercise   Vestibular Treatment Provided Gaze   Gaze Exercises X1 Viewing Horizontal;X1 Viewing Vertical;X2 Viewing Horizontal;X2 Viewing Vertical     X1 Viewing Horizontal   Foot Position walking forwards and retro   Reps 1   Comments 20 ft forwards, retro     X1 Viewing Vertical   Foot Position walking forwards and retro   Reps 1   Comments 20 ft forwards, retro     X2 Viewing Horizontal   Foot Position standing feet apart   Reps 1    Comments 10 reps, busy background     X2 Viewing Vertical   Foot Position standing feet apart   Reps 1   Comments 10 reps, busy background  Balance Exercises - 08/03/16 1219      Balance Exercises: Standing   Standing Eyes Closed Narrow base of support (BOS);Head turns;Foam/compliant surface;Other reps (comment)  feet together, partial tandem, 10 reps head turns           PT Education - 08/03/16 1214    Education provided Yes   Education Details POC, HEP   Person(s) Educated Patient   Methods Explanation;Handout   Comprehension Verbalized understanding          PT Short Term Goals - 07/06/16 1704      PT SHORT TERM GOAL #1   Title  (TARGET DATE FOR ALL STG 07/05/16) pt will be independent with HEP   Status Achieved     PT SHORT TERM GOAL #2   Title will demonstrate safe floor <> furniture transfer (for gardening/yoga) independently   Status Achieved     PT [place] #3   Title will ambulate >1000' over uneven  surfaces outdoors with supervision and report symptoms of pain or dizziness as <2/10   Status Unable to assess     PT SHORT TERM GOAL #4   Title Will negotiate 12 stairs alternating sequence, no UE at supervision level   Baseline requires one UE support for 12 stairs or supervision for no UE support   Status Achieved     PT SHORT TERM GOAL #5   Title Will complete DGI and LTG set   Baseline FGA performed 06/13/16   Status Achieved           PT Long Term Goals - 08/03/16 1237      PT LONG TERM GOAL #1   Title (TARGET DATE FOR ALL LTG 08/05/16; NEW TARGET DATE FOR REMAINING LTG 09/02/16) will report symptoms of pain, dizziness, fatigue of <2 after participating in one full yoga class   Status On-going     PT LONG TERM GOAL #2   Title Will improve functional leg strength as indicated by a decrease in 5 times sit <> stand time by 4 seconds. (6 seconds)   Baseline 6 seconds-GOAL MET 07/31/16   Status Achieved     PT LONG TERM GOAL #3   Title Pt will demonstrate improved balance and independence with community gait as indicated by gait velocity increasing to >4 ft/sec   Baseline 4.8 ft/sec-GOAL MET 07/31/16   Status Achieved     PT LONG TERM GOAL #5   Title improve FGA to >/= 28/30 for improved functional mobility   Baseline 27/30 on 07/31/16   Status Partially Met     PT LONG TERM GOAL #6   Title Pt will demonstrate ability to squat and bend down to pick up and carry 20lb weighted box >100' (up/down ramp/curb) Mod I (to simulate work requirements)   Time 4   Period Weeks   Status New     PT LONG TERM GOAL #7   Title Pt will improve functional endurance as indicated by improvement in 6 minute walk test by 271f at 12-13 RPE on BORG scale   Baseline 2500 outside over uneven grass and pavement   Status New               Plan - 08/03/16 1222    Clinical Impression Statement Completed pt re-assessment today with pt performing 6 minute walk of endurance outside due to pt  vocation requires pt to spend extended time outside and reviewed HEP progressing balance and gaze stability exercises as indicated.  Continued discussion  with pt regarding return to work and normal leisure activities.  Pt is making good progress and has met 3/5 LTG with pt continuing to demonstrate impaired gaze stability, impaired dynamic balance and balance during higher level gait challenges, impaired strength and endurance.  Pt will benefit from ongoing services to address these impairments to maximize functional mobility independence and return to work and [employer].   Rehab Potential Good   PT Frequency 1x / week   PT Duration 4 weeks   PT Treatment/Interventions ADLs/Self Care Home Management;Canalith Repostioning;Moist Heat;Gait training;Stair training;Functional mobility training;Therapeutic activities;Therapeutic exercise;Balance training;Neuromuscular re-education;Cognitive remediation;Patient/family education;Manual techniques;Passive range of motion;Energy conservation;Taping;Vestibular   PT Next Visit Plan functional strengthening, lifting, perform weight exercises (walking lunges, squats, chops) outside or on compliant surface   Consulted and Agree with Plan of Care Patient      Patient will benefit from skilled therapeutic intervention in order to improve the following deficits and impairments:  Decreased activity tolerance, Decreased balance, Decreased cognition, Decreased endurance, Decreased strength, Difficulty walking, Dizziness, Pain  Visit Diagnosis: Unsteadiness on feet  Dizziness and giddiness  Difficulty in walking, not elsewhere classified     Problem List Patient Active Problem List   Diagnosis Date Noted  . Open nasal fracture 05/29/2016  . Facial laceration 05/29/2016  . Blunt head trauma 05/29/2016  . SIADH (syndrome of inappropriate ADH production) ([place]) 05/29/2016  . TBI (traumatic brain injury) ([place]) 05/17/2016  . Rupture of plantar fascia  08/19/2015  . Degenerative tear of posterior horn of medial meniscus 06/15/2015  . Right hand pain 08/06/2013  . Leg length inequality 09/17/2012  . Pain, joint, ankle and foot 09/26/2011    Annette Kidd, PT, DPT 08/03/16    12:41 PM    [facility] [address] [place], [place], [address] Phone: [phone]   Fax:  [phone]  Name: Annette Kidd MRN: [id number] Date of Birth: [birth year]

## 2016-08-09 ENCOUNTER — Ambulatory Visit: Payer: Managed Care, Other (non HMO) | Attending: Orthopedic Surgery

## 2016-08-09 DIAGNOSIS — R42 Dizziness and giddiness: Secondary | ICD-10-CM | POA: Diagnosis present

## 2016-08-09 DIAGNOSIS — R262 Difficulty in walking, not elsewhere classified: Secondary | ICD-10-CM | POA: Diagnosis present

## 2016-08-09 DIAGNOSIS — R2681 Unsteadiness on feet: Secondary | ICD-10-CM | POA: Insufficient documentation

## 2016-08-09 DIAGNOSIS — R41841 Cognitive communication deficit: Secondary | ICD-10-CM

## 2016-08-10 NOTE — Patient Instructions (Signed)
  Please complete the assigned speech therapy homework prior to your next session and return it to the speech therapist at your next visit.  

## 2016-08-10 NOTE — Therapy (Signed)
[facility] [address] [place], [place], [address] Phone: [phone]   Fax:  [phone]  Speech Language Pathology Treatment  Patient Details  Name: Annette Kidd MRN: [id number] Date of Birth: [birth year] Referring Provider: Dr. Barnett Abu  Encounter Date: 08/09/2016      End of Session - 08/10/16 1716    Visit Number 4   Number of Visits 12   Date for SLP Re-Evaluation 08/31/16   SLP Start Time 0934   SLP Stop Time  1017   SLP Time Calculation (min) 43 min   Activity Tolerance Patient tolerated treatment well      No past medical history on file.  Past Surgical History:  Procedure Laterality Date  . APPENDECTOMY      There were no vitals filed for this visit.             ADULT SLP TREATMENT - 08/10/16 0001      General Information   Behavior/Cognition Alert;Cooperative;Pleasant mood     Treatment Provided   Treatment provided Cognitive-Linquistic     Pain Assessment   Pain Assessment No/denies pain     Cognitive-Linquistic Treatment   Treatment focused on Cognition   Skilled Treatment Facilitated divided attention between min-moderately complex attention to detail task and simple conversation and naming of scientific names of local plants, with occasional min repetition of plant name and/or pt stoppage of written task. Pt states she has felt "off" since the accident, but has sensed improvement with cognitive linguistic skills. Pt was occasionally tangential in conversation possibly due linguistic attention deficits vs./in addition to unfamiliar therapist.      Assessment / Recommendations / Plan   Plan Continue with current plan of care     Progression Toward Goals   Progression toward goals Progressing toward goals              SLP Long Term Goals - 08/10/16 1717      SLP LONG TERM GOAL #1   Title Pt will divide attention betwen 2 moderately complex cognitive linguistic  tasks with 90% on each and rare min A   Time 3   Period Weeks   Status On-going     SLP LONG TERM GOAL #2   Title Pt will perform complex attention to detail tasks with 90% accuracy and rare min A   Time 3   Period Weeks   Status On-going     SLP LONG TERM GOAL #3   Title Pt will complete complex naming tasks with rare min A   Time 3   Period Weeks   Status On-going          Plan - 08/10/16 1716    Clinical Impression Statement Pt pleasant and agreeable to work with unfamiliar thearpist. She required extra time for simple divided attention tasks today. Tangential conversation was again observed today. Continue skilled ST to maximize high level cognition for success at work.   Speech Therapy Frequency 2x / week   Treatment/Interventions Cognitive reorganization;Internal/external aids;SLP instruction and feedback;Functional tasks;Patient/family education;Compensatory strategies;Language facilitation   Potential to Achieve Goals Good   SLP Home Exercise Plan homework assigned   Consulted and Agree with Plan of Care Patient      Patient will benefit from skilled therapeutic intervention in order to improve the following deficits and impairments:   Cognitive communication deficit    Problem List Patient Active Problem List   Diagnosis Date Noted  . Open nasal fracture 05/29/2016  .  Facial laceration 05/29/2016  . Blunt head trauma 05/29/2016  . SIADH (syndrome of inappropriate ADH production) ([facility]) 05/29/2016  . TBI (traumatic brain injury) ([facility]) 05/17/2016  . Rupture of plantar fascia 08/19/2015  . Degenerative tear of posterior horn of medial meniscus 06/15/2015  . Right hand pain 08/06/2013  . Leg length inequality 09/17/2012  . Pain, joint, ankle and foot 09/26/2011    [facility] ,MS, CCC-SLP  08/10/2016, 5:18 PM  [facility] [address] [place], [place], [address] Phone: [phone]   Fax:   [phone]   Name: MARGURIETE WOOTAN MRN: [id number] Date of Birth: 1955/12/28

## 2016-08-11 ENCOUNTER — Ambulatory Visit: Payer: Managed Care, Other (non HMO)

## 2016-08-11 DIAGNOSIS — R41841 Cognitive communication deficit: Secondary | ICD-10-CM | POA: Diagnosis not present

## 2016-08-11 NOTE — Therapy (Signed)
[facility] [address] [place], [place], [address] Phone: [phone]   Fax:  [phone]  Speech Language Pathology Treatment  Patient Details  Name: Annette Kidd MRN: [id number] Date of Birth: [birth year] Referring Provider: Dr. Barnett Abu  Encounter Date: 08/11/2016      End of Session - 08/11/16 1601    Visit Number 5   Number of Visits 12   Date for SLP Re-Evaluation 08/31/16   SLP Start Time 0935   SLP Stop Time  1018   SLP Time Calculation (min) 43 min   Activity Tolerance Patient tolerated treatment well      No past medical history on file.  Past Surgical History:  Procedure Laterality Date  . APPENDECTOMY      There were no vitals filed for this visit.      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, research (physical sciences) and it would be hard for (her) to listen to multitask."               ADULT SLP TREATMENT - 08/11/16 0945      General Information   Behavior/Cognition Alert;Cooperative;Pleasant mood     Treatment Provided   Treatment provided Cognitive-Linquistic     Pain Assessment   Pain Assessment 0-10   Pain Score 1    Pain Location upper jaw   Pain Descriptors / Indicators Sore   Pain Intervention(s) Limited activity within patient's tolerance     Cognitive-Linquistic Treatment   Treatment focused on Cognition   Skilled Treatment "I'm still working some - I lay down after lunch and konk out." 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 SLP mod A needed rarely as pt generated some good ideas. SLP suggested pt take copious notes for each client, as she has pared down her client list and has turned down jobs in order to manage what she can at this time.      Assessment / Recommendations / Plan   Plan Continue with current plan of care     Progression Toward Goals    Progression toward goals Progressing toward goals          SLP Education - 08/11/16 1601    Education provided Yes   Education Details Apps (constant therapy) to improve cognition-linguistics   Person(s) Educated Patient   Methods Explanation   Comprehension Verbalized understanding            SLP Long Term Goals - 08/11/16 1604      SLP LONG TERM GOAL #1   Title Pt will divide attention betwen a moderately complex and a simple cognitive linguistic tasks with 90% on each and rare min A   Time 3   Period Weeks   Status Revised     SLP LONG TERM GOAL #2   Title Pt will perform complex attention to detail tasks with 90% accuracy and rare min A   Time 3   Period Weeks   Status On-going     SLP LONG TERM GOAL #3   Title Pt will complete complex naming tasks with rare min A   Time 3   Period Weeks   Status On-going          Plan - 08/11/16 1602    Clinical Impression Statement Pt with tangential conversation was again today, ? pt's personality trait(?). Pt demo'd good  to excellent awareness skills, and functional/improving reasoning and problem solving skills. Continue skilled ST to maximize high level cognition for success at work.   Speech Therapy Frequency 2x / week   Duration --  6 weeks or 12 total visits   Treatment/Interventions Cognitive reorganization;Internal/external aids;SLP instruction and feedback;Functional tasks;Patient/family education;Compensatory strategies;Language facilitation   Potential to Achieve Goals Good      Patient will benefit from skilled therapeutic intervention in order to improve the following deficits and impairments:   Cognitive communication deficit    Problem List Patient Active Problem List   Diagnosis Date Noted  . Open nasal fracture 05/29/2016  . Facial laceration 05/29/2016  . Blunt head trauma 05/29/2016  . SIADH (syndrome of inappropriate ADH production) ([facility]) 05/29/2016  . TBI (traumatic brain injury) ([facility])  05/17/2016  . Rupture of plantar fascia 08/19/2015  . Degenerative tear of posterior horn of medial meniscus 06/15/2015  . Right hand pain 08/06/2013  . Leg length inequality 09/17/2012  . Pain, joint, ankle and foot 09/26/2011    [facility] ,MS, CCC-SLP  08/11/2016, 4:05 PM  [facility] [address] [place], [place], [address] Phone: [phone]   Fax:  [phone]   Name: Annette Kidd MRN: [id number] Date of Birth: 07-Feb-1956

## 2016-08-11 NOTE — Patient Instructions (Signed)
Look at Constant [employer] and see what you tihnk.

## 2016-08-14 ENCOUNTER — Ambulatory Visit: Payer: Managed Care, Other (non HMO)

## 2016-08-14 DIAGNOSIS — R41841 Cognitive communication deficit: Secondary | ICD-10-CM | POA: Diagnosis not present

## 2016-08-14 NOTE — Patient Instructions (Signed)
   Constant Therapy [id number]  -15% off (good until 09-04-16) PARTNER10 - 10% off (does not expire)

## 2016-08-16 NOTE — Therapy (Signed)
[facility] [address] [place], [place], [address] Phone: [phone]   Fax:  [phone]  Speech Language Pathology Treatment  Patient Details  Name: Annette Kidd MRN: [id number] Date of Birth: November 25, 1955 Referring Provider: Dr. Barnett Abu  Encounter Date: 08/14/2016      End of Session - 08/16/16 0843    Visit Number 6   Number of Visits 12   Date for SLP Re-Evaluation 08/31/16   SLP Start Time 1104   SLP Stop Time  1145   SLP Time Calculation (min) 41 min   Activity Tolerance Patient tolerated treatment well      No past medical history on file.  Past Surgical History:  Procedure Laterality Date  . APPENDECTOMY      There were no vitals filed for this visit.             ADULT SLP TREATMENT - 08/16/16 0001      General Information   Behavior/Cognition Alert;Cooperative;Pleasant mood     Treatment Provided   Treatment provided Cognitive-Linquistic     Cognitive-Linquistic Treatment   Treatment focused on Cognition   Skilled Treatment Pt and SLP discussed pt's gradual return to work and options that pt has. At this time, SLP explained what ST has to offer and pt commented she feels she is still "in a fog" which makes tangential conversational more prevalent than pre-accident. Pt reports her preference pre-accident was to lessen or eliminate competing stimuli when needing to focus.     Assessment / Recommendations / Plan   Plan Continue with current plan of care     Progression Toward Goals   Progression toward goals Progressing toward goals              SLP Long Term Goals - 08/16/16 0845      SLP LONG TERM GOAL #1   Title Pt will divide attention betwen a moderately complex and a simple cognitive linguistic tasks with 90% on each and rare min A   Time 2   Period Weeks   Status Revised     SLP LONG TERM GOAL #2   Title Pt will perform complex attention to detail tasks with  90% accuracy and rare min A   Time 2   Period Weeks   Status On-going     SLP LONG TERM GOAL #3   Title Pt will complete complex naming tasks with rare min A   Time 2   Period Weeks   Status On-going          Plan - 08/16/16 0843    Clinical Impression Statement SLP ascertained today that tangential conversation is partially pt's personality trait, however pt states is mildly exacerbated now. Pt with con't good/ excellent awareness skills, and reasoning and problem solving skills. Pt to look at Constant Therapy app this week. Continue skilled ST to maximize high level cognition for success at work. Tx will continue at once/week due to progress.   Speech Therapy Frequency 2x / week   Duration --  6 weeks or 12 total visits   Treatment/Interventions Cognitive reorganization;Internal/external aids;SLP instruction and feedback;Functional tasks;Patient/family education;Compensatory strategies;Language facilitation   Potential to Achieve Goals Good      Patient will benefit from skilled therapeutic intervention in order to improve the following deficits and impairments:   Cognitive communication deficit    Problem List Patient Active Problem List   Diagnosis Date Noted  . Open nasal fracture 05/29/2016  . Facial  laceration 05/29/2016  . Blunt head trauma 05/29/2016  . SIADH (syndrome of inappropriate ADH production) ([facility]) 05/29/2016  . TBI (traumatic brain injury) ([facility]) 05/17/2016  . Rupture of plantar fascia 08/19/2015  . Degenerative tear of posterior horn of medial meniscus 06/15/2015  . Right hand pain 08/06/2013  . Leg length inequality 09/17/2012  . Pain, joint, ankle and foot 09/26/2011    [facility] ,MS, CCC-SLP  08/16/2016, 8:47 AM  [facility] [address] [place], [place], [address] Phone: [phone]   Fax:  [phone]   Name: Annette Kidd MRN: [id number] Date of Birth: [birth year]

## 2016-08-17 ENCOUNTER — Encounter: Payer: Self-pay | Admitting: Physical Therapy

## 2016-08-17 ENCOUNTER — Ambulatory Visit: Payer: Managed Care, Other (non HMO) | Admitting: Physical Therapy

## 2016-08-17 ENCOUNTER — Ambulatory Visit: Payer: Managed Care, Other (non HMO)

## 2016-08-17 DIAGNOSIS — R2681 Unsteadiness on feet: Secondary | ICD-10-CM

## 2016-08-17 DIAGNOSIS — R42 Dizziness and giddiness: Secondary | ICD-10-CM

## 2016-08-17 DIAGNOSIS — R41841 Cognitive communication deficit: Secondary | ICD-10-CM | POA: Diagnosis not present

## 2016-08-17 DIAGNOSIS — R262 Difficulty in walking, not elsewhere classified: Secondary | ICD-10-CM

## 2016-08-17 NOTE — Therapy (Signed)
[facility] [address] [place], [place], [address] Phone: [phone]   Fax:  [phone]  Speech Language Pathology Treatment  Patient Details  Name: Annette Kidd MRN: [id number] Date of Birth: 09-Apr-1956 Referring Provider: Dr. Barnett Abu  Encounter Date: 08/17/2016      End of Session - 08/17/16 1121    Visit Number 7   Number of Visits 12   Date for SLP Re-Evaluation 09/29/16   SLP Start Time 1017   SLP Stop Time  1100   SLP Time Calculation (min) 43 min   Activity Tolerance Patient tolerated treatment well      No past medical history on file.  Past Surgical History:  Procedure Laterality Date  . APPENDECTOMY      There were no vitals filed for this visit.      Subjective Assessment - 08/17/16 1053    Subjective Pt contacted clients this week and told them about the probable inability for her to drive over the summer.               ADULT SLP TREATMENT - 08/17/16 1058      General Information   Behavior/Cognition Alert;Cooperative;Pleasant mood     Treatment Provided   Treatment provided Cognitive-Linquistic     Cognitive-Linquistic Treatment   Treatment focused on Cognition   Skilled Treatment SLP and pt engaged in 35 minutes of conversation with SLP tracking pt's tangential conversation - pt did excellent job with staying on topic appropriately - pt stated, "See Baldo Ash if I pay attention to it I can do it." Pt suggests her attention skills are likely commensurate with premorbid ability as she would not do two mod complex tasks at once.     Assessment / Recommendations / Plan   Plan Continue with current plan of care  at once a week     Progression Toward Goals   Progression toward goals Progressing toward goals              SLP Long Term Goals - 08/17/16 1129      SLP LONG TERM GOAL #1   Title Pt will divide attention betwen a moderately complex and a simple cognitive  linguistic tasks with 90% on each and rare min A   Time --   Period --   Status Deferred  at baseline, per pt     SLP LONG TERM GOAL #2   Title Pt will perform complex attention to detail tasks with 90% accuracy and rare min A   Time 4   Period Weeks   Status On-going     SLP LONG TERM GOAL #3   Title Pt will complete complex naming tasks with rare min A   Time 4   Period Weeks   Status On-going     SLP LONG TERM GOAL #4   Title Pt will demo WNL topic mainenance over a 30 minute conversation in three consecutive sessions   Time 4   Period Weeks   Status New          Plan - 08/17/16 1122    Clinical Impression Statement No tangential conversation noted today in 35 minute conversation. Pt cont'd to demo anticiapatory awareness/executive function WNL. Pt states her attention may be at baseline. Pt downloaded Constant Therapy from app store and has begun to work with her free trial. Continue skilled ST at once per week to maximize cognitive linguistic skills.    Speech Therapy Frequency 1x /week  Duration --  4 weeks/11 total visits   Treatment/Interventions Cognitive reorganization;Internal/external aids;SLP instruction and feedback;Functional tasks;Patient/family education;Compensatory strategies;Language facilitation   Potential to Achieve Goals Good   Consulted and Agree with Plan of Care Patient      Patient will benefit from skilled therapeutic intervention in order to improve the following deficits and impairments:   Cognitive communication deficit - Plan: SLP plan of care cert/re-cert    Problem List Patient Active Problem List   Diagnosis Date Noted  . Open nasal fracture 05/29/2016  . Facial laceration 05/29/2016  . Blunt head trauma 05/29/2016  . SIADH (syndrome of inappropriate ADH production) (HCC) 05/29/2016  . TBI (traumatic brain injury) (HCC) 05/17/2016  . Rupture of plantar fascia 08/19/2015  . Degenerative tear of posterior horn of medial meniscus  06/15/2015  . Right hand pain 08/06/2013  . Leg length inequality 09/17/2012  . Pain, joint, ankle and foot 09/26/2011    [facility] ,MS, CCC-SLP  08/17/2016, 11:54 AM  [facility] [address] [place], [place], [address] Phone: [phone]   Fax:  [phone]   Name: Annette Kidd MRN: [id number] Date of Birth: Dec 02, 1955

## 2016-08-17 NOTE — Therapy (Signed)
[facility] [address] [place], [place], [address] Phone: [phone]   Fax:  [phone]  Physical Therapy Treatment  Patient Details  Name: Annette Kidd MRN: [id number] Date of Birth: 1955/07/03 Referring Provider: Lisette Abu, PA-C  Encounter Date: 08/17/2016      PT End of Session - 08/17/16 1051    Visit Number 18   Number of Visits 22   Date for PT Re-Evaluation 09/02/16   Authorization Type [employer]-60 visits combined PT/OT   PT Start Time 0930   PT Stop Time 1016   PT Time Calculation (min) 46 min   Activity Tolerance Patient tolerated treatment well   Behavior During Therapy [facility] for tasks assessed/performed      History reviewed. No pertinent past medical history.  Past Surgical History:  Procedure Laterality Date  . APPENDECTOMY      There were no vitals filed for this visit.      Subjective Assessment - 08/17/16 0946    Subjective Pt feeling discouraged; reports feeling more dizzy but reports being more active now, fatigued and stressed about returning to work.   Pertinent History *patient reports that she had an episode of 8/10 spinning dizziness approximately 15 years ago due to bacterial infection.     Limitations Other (comment);Walking;Standing   Patient Stated Goals Walk longer and return to regular activities-yoga, typing, gardening, hiking, fitness when cleared by MD   Currently in Pain? No/denies                Vestibular Assessment - 08/17/16 0950      Positional Testing   Dix-Hallpike Dix-Hallpike Right;Dix-Hallpike Left   Horizontal Canal Testing --     Dix-Hallpike Left   Dix-Hallpike Left Duration 0   Dix-Hallpike Left Symptoms No nystagmus     Sidelying Right   Sidelying Right Duration 10 seconds   Sidelying Right Symptoms Upbeat, right rotatory nystagmus                 [facility] Adult PT Treatment/Exercise - 08/17/16 1057      Self-Care    Self-Care Other Self-Care Comments   Lifting squatting vs. bending down    Other Self-Care Comments  propping head up on towel or rolled mat to keep head elevated above shoulders during body pump exercises         Vestibular Treatment/Exercise - 08/17/16 1048      Vestibular Treatment/Exercise   Vestibular Treatment Provided Canalith Repositioning   Canalith Repositioning Epley Manuever Right   Habituation Exercises Nestor Lewandowsky      EPLEY MANUEVER RIGHT   Number of Reps  2   Overall Response Improved Symptoms               PT Education - 08/17/16 1049    Education provided Yes   Education Details discussed options for emotional support/coping, self monitoring of symptoms, role of fatigue and anxiety on symptoms, BPPV re-occurence, self treatment with Brandt-daroff and CRM with pillow on bed, propping head up when on floor for Body Pump exercises   Person(s) Educated Patient   Methods Explanation;Demonstration   Comprehension Verbalized understanding;Returned demonstration          PT Short Term Goals - 07/06/16 1704      PT SHORT TERM GOAL #1   Title  (TARGET DATE FOR ALL STG 07/05/16) pt will be independent with HEP   Status Achieved     PT SHORT TERM GOAL #2   Title  will demonstrate safe floor <> furniture transfer (for gardening/yoga) independently   Status Achieved     PT [place] #3   Title will ambulate >1000' over uneven surfaces outdoors with supervision and report symptoms of pain or dizziness as <2/10   Status Unable to assess     PT SHORT TERM GOAL #4   Title Will negotiate 12 stairs alternating sequence, no UE at supervision level   Baseline requires one UE support for 12 stairs or supervision for no UE support   Status Achieved     PT SHORT TERM GOAL #5   Title Will complete DGI and LTG set   Baseline FGA performed 06/13/16   Status Achieved           PT Long Term Goals - 08/03/16 1237      PT LONG TERM GOAL #1   Title (TARGET  DATE FOR ALL LTG 08/05/16; NEW TARGET DATE FOR REMAINING LTG 09/02/16) will report symptoms of pain, dizziness, fatigue of <2 after participating in one full yoga class   Status On-going     PT LONG TERM GOAL #2   Title Will improve functional leg strength as indicated by a decrease in 5 times sit <> stand time by 4 seconds. (6 seconds)   Baseline 6 seconds-GOAL MET 07/31/16   Status Achieved     PT LONG TERM GOAL #3   Title Pt will demonstrate improved balance and independence with community gait as indicated by gait velocity increasing to >4 ft/sec   Baseline 4.8 ft/sec-GOAL MET 07/31/16   Status Achieved     PT LONG TERM GOAL #5   Title improve FGA to >/= 28/30 for improved functional mobility   Baseline 27/30 on 07/31/16   Status Partially Met     PT LONG TERM GOAL #6   Title Pt will demonstrate ability to squat and bend down to pick up and carry 20lb weighted box >100' (up/down ramp/curb) Mod I (to simulate work requirements)   Time 4   Period Weeks   Status New     PT LONG TERM GOAL #7   Title Pt will improve functional endurance as indicated by improvement in 6 minute walk test by 216f at 12-13 RPE on BORG scale   Baseline 2500 outside over uneven grass and pavement   Status New               Plan - 08/17/16 1051    Clinical Impression Statement Due to pt reporting spinning/vertigo in supine positions and transitional movements and pt found to have R posterior canal canalithiasis.  Pt treated with CRM x 2 with improved symptoms.  Discussed mechanisms/head positions that may cause re-occurence of BPPV (head below body when on floor for exercise class), head in dependent position during household/gardening tasks and ways to adjust tasks/movements to minimize re-occurence of vertigo.  Also discussed symptom self monitoring and self care (rest, stress management, coping skills, speaking with a counseling professional).  Pt educated on self treatment options for home-will  practice next session.  Will continue to address to reach targeted goals.    Rehab Potential Good   PT Treatment/Interventions ADLs/Self Care Home Management;Canalith Repostioning;Moist Heat;Gait training;Stair training;Functional mobility training;Therapeutic activities;Therapeutic exercise;Balance training;Neuromuscular re-education;Cognitive remediation;Patient/family education;Manual techniques;Passive range of motion;Energy conservation;Taping;Vestibular   PT Next Visit Plan re-assess canals if pt continues to report positional vertigo; functional strengthening, lifting, perform weight exercises (walking lunges, squats, chops) outside or on compliant surface   Consulted and Agree with  Plan of Care Patient      Patient will benefit from skilled therapeutic intervention in order to improve the following deficits and impairments:  Decreased activity tolerance, Decreased balance, Decreased cognition, Decreased endurance, Decreased strength, Difficulty walking, Dizziness, Pain  Visit Diagnosis: Unsteadiness on feet  Dizziness and giddiness  Difficulty in walking, not elsewhere classified     Problem List Patient Active Problem List   Diagnosis Date Noted  . Open nasal fracture 05/29/2016  . Facial laceration 05/29/2016  . Blunt head trauma 05/29/2016  . SIADH (syndrome of inappropriate ADH production) ([place]) 05/29/2016  . TBI (traumatic brain injury) ([place]) 05/17/2016  . Rupture of plantar fascia 08/19/2015  . Degenerative tear of posterior horn of medial meniscus 06/15/2015  . Right hand pain 08/06/2013  . Leg length inequality 09/17/2012  . Pain, joint, ankle and foot 09/26/2011   Raylene Everts, PT, DPT 08/17/16    10:59 AM    [facility] [address] [place], [place], [address] Phone: [phone]   Fax:  [phone]  Name: Annette Kidd MRN: [id number] Date of Birth: 03/09/1956

## 2016-08-22 ENCOUNTER — Ambulatory Visit: Payer: Managed Care, Other (non HMO) | Admitting: Speech Pathology

## 2016-08-22 DIAGNOSIS — R41841 Cognitive communication deficit: Secondary | ICD-10-CM

## 2016-08-22 NOTE — Therapy (Signed)
[facility] [address] [place], [place], [address] Phone: [phone]   Fax:  [phone]  Speech Language Pathology Treatment  Patient Details  Name: Annette Kidd MRN: [id number] Date of Birth: [birth year] Referring Provider: Dr. Barnett Abu  Encounter Date: 08/22/2016      End of Session - 08/22/16 1543    Visit Number 8   Number of Visits 12   Date for SLP Re-Evaluation 09/29/16   SLP Start Time 1317   SLP Stop Time  1400   SLP Time Calculation (min) 43 min   Activity Tolerance Patient tolerated treatment well      No past medical history on file.  Past Surgical History:  Procedure Laterality Date  . APPENDECTOMY      There were no vitals filed for this visit.      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 Pt stayed on topic in 30 minute conversation      Assessment / Recommendations / Plan   Plan Continue with current plan of care     Progression Toward Goals   Progression toward goals Progressing toward goals              SLP Long Term Goals - 08/22/16 1543      SLP LONG TERM GOAL #1   Title Pt will divide attention betwen a moderately  complex and a simple cognitive linguistic tasks with 90% on each and rare min A   Status Deferred  at baseline, per pt     SLP LONG TERM GOAL #2   Title Pt will perform complex attention to detail tasks with 90% accuracy and rare min A   Time 3   Period Weeks   Status On-going     SLP LONG TERM GOAL #3   Title Pt will complete complex naming tasks with rare min A   Time 3   Period Weeks   Status On-going     SLP LONG TERM GOAL #4   Title Pt will demo WNL topic mainenance over a 30 minute conversation in three consecutive sessions   Time 3   Period Weeks   Status On-going          Plan - 08/22/16 1539    Clinical Impression Statement Pt participated in conversation today on topic with mod I. She reports being more aware of not being tangential. Moderately complex naming task with rare min A. Pt reports enjoying Constant Therapy and doing other cognitive activities at home. She is anticipating appropriate work load and problem solving how to reduce work load with occasional min A. Continue skilled ST to maximize high level cognition for possible return to PLOF  at work.    Speech Therapy Frequency 1x /week   Treatment/Interventions Cognitive reorganization;Internal/external aids;SLP instruction and feedback;Functional tasks;Patient/family education;Compensatory strategies;Language facilitation   Potential to Achieve Goals Good   Consulted and Agree with Plan of Care Patient      Patient will benefit from skilled therapeutic intervention in order to improve the following deficits and impairments:   Cognitive communication deficit    Problem List Patient Active Problem List   Diagnosis Date Noted  . Open nasal fracture 05/29/2016  . Facial laceration 05/29/2016  . Blunt head trauma 05/29/2016  . SIADH (syndrome of inappropriate ADH production) (HCC) 05/29/2016  . TBI (traumatic brain injury) (HCC) 05/17/2016  . Rupture of plantar fascia 08/19/2015  . Degenerative tear of  posterior horn of medial meniscus 06/15/2015  . Right hand pain 08/06/2013  . Leg length inequality 09/17/2012  . Pain, joint, ankle and foot 09/26/2011    Roczen Waymire, Radene Journey MS, CCC-SLP 08/22/2016, 3:44 PM  [facility] [address] [place], [place], [address] Phone: [phone]   Fax:  [phone]   Name: Annette Kidd MRN: [id number] Date of Birth: 02-26-1956

## 2016-08-22 NOTE — Patient Instructions (Signed)
  Use letters of alphabet and choose a topic (colors, candies, designers, actors, songs etc - anything that interests you) and think of several words for each letter that you can  Continue taking notes when talking with clients or writing down topics that you need to tell them prior to talking to them  You are doing great thinking of ways to make your schedule and work load work for you.    Group conversations, high stimulation environments such as large stores, restaurants, concerts may be harder for you to process and cause fatigue.  If you have something important to do in the evening, rest up earlier  Do complicated tasks when you are most alert/awake  Turn down TV, step away from noisy appliances, fans, running water ect  When you need to concentrate on what someone is saying

## 2016-08-25 ENCOUNTER — Encounter: Payer: Managed Care, Other (non HMO) | Admitting: *Deleted

## 2016-08-25 ENCOUNTER — Encounter: Payer: Self-pay | Admitting: Physical Therapy

## 2016-08-25 ENCOUNTER — Ambulatory Visit: Payer: Managed Care, Other (non HMO) | Admitting: Physical Therapy

## 2016-08-25 DIAGNOSIS — R42 Dizziness and giddiness: Secondary | ICD-10-CM

## 2016-08-25 DIAGNOSIS — R262 Difficulty in walking, not elsewhere classified: Secondary | ICD-10-CM

## 2016-08-25 DIAGNOSIS — R2681 Unsteadiness on feet: Secondary | ICD-10-CM

## 2016-08-25 DIAGNOSIS — R41841 Cognitive communication deficit: Secondary | ICD-10-CM | POA: Diagnosis not present

## 2016-08-25 NOTE — Patient Instructions (Signed)
How to Perform the Epley Maneuver The Epley maneuver is an exercise that relieves symptoms of vertigo. Vertigo is the feeling that you or your surroundings are moving when they are not. When you feel vertigo, you may feel like the room is spinning and have trouble walking. Dizziness is a little different than vertigo. When you are dizzy, you may feel unsteady or light-headed. You can do this maneuver at home whenever you have symptoms of vertigo. You can do it up to 3 times a day until your symptoms go away. Even though the Epley maneuver may relieve your vertigo for a few weeks, it is possible that your symptoms will return. This maneuver relieves vertigo, but it does not relieve dizziness. What are the risks? If it is done correctly, the Epley maneuver is considered safe. Sometimes it can lead to dizziness or nausea that goes away after a short time. If you develop other symptoms, such as changes in vision, weakness, or numbness, stop doing the maneuver and call your health care provider. How to perform the Epley maneuver 1. Sit on the edge of a bed or table with your back straight and your legs extended or hanging over the edge of the bed or table. 2. Turn your head halfway toward the affected ear or side (right side). 3. Lie backward quickly with your head turned until you are lying flat on your back. Position a pillow under your shoulders so your head is lower than your shoulders. 4. Hold this position for 30-40 seconds. You may experience an attack of vertigo. This is normal. 5. Turn your head to the opposite direction (left) until your unaffected ear is facing the floor. 6. Hold this position for 30-40 seconds. You may experience an attack of vertigo. This is normal. Hold this position until the vertigo stops. 7. Turn your whole body to the same side as your head (roll to L side-you will be looking down at the bed). Hold for another 30-40 seconds. 8. Tuck your chin into your chest and then Sit  back up slowly. You can repeat this exercise up to 3 times a day. Follow these instructions at home:  After doing the Epley maneuver, you can return to your normal activities.  Ask your health care provider if there is anything you should do at home to prevent vertigo. He or she may recommend that you:  Keep your head raised (elevated) with two or more pillows while you sleep.  Do not sleep on the side of your affected ear.  Get up slowly from bed.  Avoid sudden movements during the day.  Avoid extreme head movement, like looking up or bending over. Contact a health care provider if:  Your vertigo gets worse.  You have other symptoms, including:  Nausea.  Vomiting.  Headache. Get help right away if:  You have vision changes.  You have a severe or worsening headache or neck pain.  You cannot stop vomiting.  You have new numbness or weakness in any part of your body. Summary  Vertigo is the feeling that you or your surroundings are moving when they are not.  The Epley maneuver is an exercise that relieves symptoms of vertigo.  If the Epley maneuver is done correctly, it is considered safe. You can do it up to 3 times a day. This information is not intended to replace advice given to you by your health care provider. Make sure you discuss any questions you have with your health care provider. Document  Released: 04/29/2013 Document Revised: 03/14/2016 Document Reviewed: 03/14/2016 Elsevier Interactive Patient Education  2017 [employer].

## 2016-08-25 NOTE — Therapy (Signed)
[facility] [address] [place], [place], [address] Phone: [phone]   Fax:  [phone]  Physical Therapy Treatment  Patient Details  Name: Annette Kidd MRN: [id number] Date of Birth: Dec 11, 1955 Referring Provider: Lisette Abu, PA-C  Encounter Date: 08/25/2016      PT End of Session - 08/25/16 1248    Visit Number 19   Number of Visits 22   Date for PT Re-Evaluation 09/02/16   Authorization Type [employer]-60 visits combined PT/OT   PT Start Time 1103   PT Stop Time 1151   PT Time Calculation (min) 48 min   Activity Tolerance Patient tolerated treatment well   Behavior During Therapy [facility] for tasks assessed/performed      History reviewed. No pertinent past medical history.  Past Surgical History:  Procedure Laterality Date  . APPENDECTOMY      There were no vitals filed for this visit.      Subjective Assessment - 08/25/16 1117    Subjective Pt continues to feel frustrated due to feeling dizzy and very fatigued with increased activity and working; notices more dizziness after working and when she does not rest well at night; still feels "foggy".   Patient is accompained by: Family member   Pertinent History *patient reports that she had an episode of 8/10 spinning dizziness approximately 15 years ago due to bacterial infection.     Limitations Other (comment);Walking;Standing   Patient Stated Goals Walk longer and return to regular activities-yoga, typing, gardening, hiking, fitness when cleared by MD   Currently in Pain? No/denies                Vestibular Assessment - 08/25/16 1144      Positional Testing   Dix-Hallpike Dix-Hallpike Right;Dix-Hallpike Left     Dix-Hallpike Right   Dix-Hallpike Right Duration 2   Dix-Hallpike Right Symptoms Upbeat, right rotatory nystagmus     Dix-Hallpike Left   Dix-Hallpike Left Duration 0   Dix-Hallpike Left Symptoms No nystagmus                  [facility] Adult PT Treatment/Exercise - 08/25/16 1243      Self-Care   Self-Care Other Self-Care Comments   Other Self-Care Comments  self treatment for BPPV-CRM with pillow at shoulder level so head stays supported on bed         Vestibular Treatment/Exercise - 08/25/16 1145      Vestibular Treatment/Exercise   Vestibular Treatment Provided Canalith Repositioning   Canalith Repositioning Epley Manuever Right      EPLEY MANUEVER RIGHT   Number of Reps  2  one self treatment   Overall Response Improved Symptoms               PT Education - 08/25/16 1244    Education provided Yes   Education Details Self treatment for BPPV-Epley with pillow behind shoulders.  Continued education on role of fatigue, anxiety and stress on symptoms and self-care/self monitoring symptoms with activities.  Encouraged pt to discuss with PCP at annual physical next month.   Person(s) Educated Patient   Methods Explanation;Demonstration;Handout   Comprehension Verbalized understanding;Returned demonstration          PT Short Term Goals - 07/06/16 1704      PT SHORT TERM GOAL #1   Title  (TARGET DATE FOR ALL STG 07/05/16) pt will be independent with HEP   Status Achieved     PT SHORT TERM GOAL #2  Title will demonstrate safe floor <> furniture transfer (for gardening/yoga) independently   Status Achieved     PT [place] #3   Title will ambulate >1000' over uneven surfaces outdoors with supervision and report symptoms of pain or dizziness as <2/10   Status Unable to assess     PT SHORT TERM GOAL #4   Title Will negotiate 12 stairs alternating sequence, no UE at supervision level   Baseline requires one UE support for 12 stairs or supervision for no UE support   Status Achieved     PT SHORT TERM GOAL #5   Title Will complete DGI and LTG set   Baseline FGA performed 06/13/16   Status Achieved           PT Long Term Goals - 08/03/16 1237      PT LONG  TERM GOAL #1   Title (TARGET DATE FOR ALL LTG 08/05/16; NEW TARGET DATE FOR REMAINING LTG 09/02/16) will report symptoms of pain, dizziness, fatigue of <2 after participating in one full yoga class   Status On-going     PT LONG TERM GOAL #2   Title Will improve functional leg strength as indicated by a decrease in 5 times sit <> stand time by 4 seconds. (6 seconds)   Baseline 6 seconds-GOAL MET 07/31/16   Status Achieved     PT LONG TERM GOAL #3   Title Pt will demonstrate improved balance and independence with community gait as indicated by gait velocity increasing to >4 ft/sec   Baseline 4.8 ft/sec-GOAL MET 07/31/16   Status Achieved     PT LONG TERM GOAL #5   Title improve FGA to >/= 28/30 for improved functional mobility   Baseline 27/30 on 07/31/16   Status Partially Met     PT LONG TERM GOAL #6   Title Pt will demonstrate ability to squat and bend down to pick up and carry 20lb weighted box >100' (up/down ramp/curb) Mod I (to simulate work requirements)   Time 4   Period Weeks   Status New     PT LONG TERM GOAL #7   Title Pt will improve functional endurance as indicated by improvement in 6 minute walk test by 23f at 12-13 RPE on BORG scale   Baseline 2500 outside over uneven grass and pavement   Status New               Plan - 08/25/16 1248    Clinical Impression Statement Pt continues to present with R posterior canal canalithiasis but less severe today; performed CRM and educated pt on self treatment for home due to rate of re-occurence.  Continued to provide emotional support and education on symptom management and discussion with PCP about issues related to fatigue and anxiety and treatment options.  Pt verbalized understanding.  Will continue to address and monitor.   Rehab Potential Good   PT Treatment/Interventions ADLs/Self Care Home Management;Canalith Repostioning;Moist Heat;Gait training;Stair training;Functional mobility training;Therapeutic  activities;Therapeutic exercise;Balance training;Neuromuscular re-education;Cognitive remediation;Patient/family education;Manual techniques;Passive range of motion;Energy conservation;Taping;Vestibular   PT Next Visit Plan review self-treatment/epley; functional strengthening, lifting, perform weight exercises (walking lunges, squats, chops) outside or on compliant surface, dual tasking (naming plants/trees/etc).   Consulted and Agree with Plan of Care Patient      Patient will benefit from skilled therapeutic intervention in order to improve the following deficits and impairments:  Decreased activity tolerance, Decreased balance, Decreased cognition, Decreased endurance, Decreased strength, Difficulty walking, Dizziness, Pain  Visit Diagnosis: Unsteadiness  on feet  Dizziness and giddiness  Difficulty in walking, not elsewhere classified     Problem List Patient Active Problem List   Diagnosis Date Noted  . Open nasal fracture 05/29/2016  . Facial laceration 05/29/2016  . Blunt head trauma 05/29/2016  . SIADH (syndrome of inappropriate ADH production) ([place]) 05/29/2016  . TBI (traumatic brain injury) ([place]) 05/17/2016  . Rupture of plantar fascia 08/19/2015  . Degenerative tear of posterior horn of medial meniscus 06/15/2015  . Right hand pain 08/06/2013  . Leg length inequality 09/17/2012  . Pain, joint, ankle and foot 09/26/2011   Raylene Everts, PT, DPT 08/25/16    12:54 PM    [facility] [address] [place], [place], [address] Phone: [phone]   Fax:  [phone]  Name: Annette Kidd MRN: [id number] Date of Birth: 1955/11/26

## 2016-08-28 ENCOUNTER — Ambulatory Visit: Payer: Managed Care, Other (non HMO) | Admitting: Physical Therapy

## 2016-08-31 ENCOUNTER — Encounter: Payer: Self-pay | Admitting: Physical Therapy

## 2016-08-31 ENCOUNTER — Ambulatory Visit: Payer: Managed Care, Other (non HMO) | Admitting: Physical Therapy

## 2016-08-31 ENCOUNTER — Ambulatory Visit: Payer: Managed Care, Other (non HMO) | Admitting: Speech Pathology

## 2016-08-31 DIAGNOSIS — R262 Difficulty in walking, not elsewhere classified: Secondary | ICD-10-CM

## 2016-08-31 DIAGNOSIS — R41841 Cognitive communication deficit: Secondary | ICD-10-CM

## 2016-08-31 DIAGNOSIS — R2681 Unsteadiness on feet: Secondary | ICD-10-CM

## 2016-08-31 DIAGNOSIS — R42 Dizziness and giddiness: Secondary | ICD-10-CM

## 2016-08-31 NOTE — Therapy (Signed)
[facility] [address] [place], [place], [address] Phone: [phone]   Fax:  [phone]  Speech Language Pathology Treatment  Patient Details  Name: PARTICIA STRAHM MRN: [id number] Date of Birth: 10/05/1955 Referring Provider: Dr. Barnett Abu  Encounter Date: 08/31/2016      End of Session - 08/31/16 1455    Visit Number 9   Number of Visits 12   Date for SLP Re-Evaluation 09/29/16   SLP Start Time 1233   SLP Stop Time  1315   SLP Time Calculation (min) 42 min   Activity Tolerance Patient tolerated treatment well      No past medical history on file.  Past Surgical History:  Procedure Laterality Date  . APPENDECTOMY      There were no vitals filed for this visit.      Subjective Assessment - 08/31/16 1237    Subjective "I've decided I'm not going to take anymore jobs and go on sabbatical"               ADULT SLP TREATMENT - 08/31/16 1238      General Information   Behavior/Cognition Alert;Cooperative;Pleasant mood     Treatment Provided   Treatment provided Cognitive-Linquistic     Pain Assessment   Pain Assessment No/denies pain     Cognitive-Linquistic Treatment   Treatment focused on Cognition   Skilled Treatment Pt continues to do work on constant therapy and did ABC lists  for naming. Moderately complex attention to detail with occasional min A. Complex reasoning/functional math with rare min A.     Assessment / Recommendations / Plan   Plan Continue with current plan of care     Progression Toward Goals   Progression toward goals Progressing toward goals              SLP Long Term Goals - 08/31/16 1454      SLP LONG TERM GOAL #1   Title Pt will divide attention betwen a moderately complex and a simple cognitive linguistic tasks with 90% on each and rare min A   Status Deferred  at baseline, per pt     SLP LONG TERM GOAL #2   Title Pt will perform complex  attention to detail tasks with 90% accuracy and rare min A   Time 2   Period Weeks   Status On-going     SLP LONG TERM GOAL #3   Title Pt will complete complex naming tasks with rare min A   Time 2   Period Weeks   Status Achieved     SLP LONG TERM GOAL #4   Title Pt will demo WNL topic mainenance over a 30 minute conversation in three consecutive sessions   Time 2   Period Weeks   Status On-going          Plan - 08/31/16 1453    Clinical Impression Statement Pt continues to do cognitive enchancing activities at home, and reducing work load. Complex reasoning and attention to detail with occasional min A. Continue skilled ST 1-2 more sessions to maximize high level cognition for independence and QOL.   Speech Therapy Frequency 1x /week   Treatment/Interventions Cognitive reorganization;Internal/external aids;SLP instruction and feedback;Functional tasks;Patient/family education;Compensatory strategies;Language facilitation   Potential to Achieve Goals Good   SLP Home Exercise Plan homework assigned   Consulted and Agree with Plan of Care Patient      Patient will benefit from skilled therapeutic intervention in order to improve  the following deficits and impairments:   Cognitive communication deficit    Problem List Patient Active Problem List   Diagnosis Date Noted  . Open nasal fracture 05/29/2016  . Facial laceration 05/29/2016  . Blunt head trauma 05/29/2016  . SIADH (syndrome of inappropriate ADH production) ([facility]) 05/29/2016  . TBI (traumatic brain injury) ([facility]) 05/17/2016  . Rupture of plantar fascia 08/19/2015  . Degenerative tear of posterior horn of medial meniscus 06/15/2015  . Right hand pain 08/06/2013  . Leg length inequality 09/17/2012  . Pain, joint, ankle and foot 09/26/2011    Lovvorn, Radene Journey MS, CCC-SLP 08/31/2016, 2:56 PM  [facility] [address] [place], [place],  [address] Phone: [phone]   Fax:  [phone]   Name: AVAREE GILBERTI MRN: [id number] Date of Birth: 25-Sep-1955

## 2016-08-31 NOTE — Therapy (Signed)
[facility] [address] [place], [place], [address] Phone: [phone]   Fax:  [phone]  Physical Therapy Treatment  Patient Details  Name: Annette Kidd MRN: [id number] Date of Birth: Feb 04, 1956 Referring Provider: Lisette Abu, PA-C  Encounter Date: 08/31/2016      PT End of Session - 08/31/16 1457    Visit Number 20   Number of Visits 22   Date for PT Re-Evaluation 09/02/16   Authorization Type [employer]-60 visits combined PT/OT   PT Start Time 5188   PT Stop Time 1403   PT Time Calculation (min) 46 min   Activity Tolerance Patient tolerated treatment well   Behavior During Therapy [facility] for tasks assessed/performed      History reviewed. No pertinent past medical history.  Past Surgical History:  Procedure Laterality Date  . APPENDECTOMY      There were no vitals filed for this visit.      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  Pt feels that self-CRM has been helping with dizziness.   Patient is accompained by: Family member   Pertinent History *patient reports that she had an episode of 8/10 spinning dizziness approximately 15 years ago due to bacterial infection.     Limitations Other (comment);Walking;Standing   Patient Stated Goals Walk longer and return to regular activities-yoga, typing, gardening, hiking, fitness when cleared by MD   Currently in Pain? No/denies           [facility] Adult PT Treatment/Exercise - 08/31/16 1351      Knee/Hip Exercises: Standing   Lateral Step Up Right;Left;10 reps   Lateral Step Up Limitations holding 6 lb weights in each hand, on BOSU   Forward Step Up Right;Left;10 reps   Forward Step Up Limitations holding 6 lb weights in each hand, on BOSU   Functional Squat 15 reps    Functional Squat Limitations standing on Bosu ball holding 6 lb weight   Other Standing Knee Exercises Performed standing balance on BOSU ball and performing overhead press with 6lb hand weight with vertical head turns and mod A for balance; also performed functional lifting and squatting reaching to floor every 10 steps to pick up or place BOSU ball on floor and carry at chest level or over head over level ground 115', up/down stairs and over ramp/curb and retro gait x 115'             PT Education - 08/31/16 1456    Education provided Yes   Education Details HEP-cease performing habituation, use CRM PRN.  Adjust x 1 viewing during gait to busy background, x 2 viewing work up to 2 minutes.     Person(s) Educated Patient   Methods Explanation   Comprehension Verbalized understanding          PT Short Term Goals - 07/06/16 1704      PT SHORT TERM GOAL #1   Title  (TARGET DATE FOR ALL STG 07/05/16) pt will be independent with HEP   Status Achieved     PT SHORT TERM GOAL #2   Title will demonstrate safe floor <> furniture transfer (for gardening/yoga) independently   Status Achieved     PT SHORT TERM GOAL #3   Title will ambulate >1000' over uneven surfaces outdoors with supervision  and report symptoms of pain or dizziness as <2/10   Status Unable to assess     PT SHORT TERM GOAL #4   Title Will negotiate 12 stairs alternating sequence, no UE at supervision level   Baseline requires one UE support for 12 stairs or supervision for no UE support   Status Achieved     PT SHORT TERM GOAL #5   Title Will complete DGI and LTG set   Baseline FGA performed 06/13/16   Status Achieved           PT Long Term Goals - 08/03/16 1237      PT LONG TERM GOAL #1   Title (TARGET DATE FOR ALL LTG 08/05/16; NEW TARGET DATE FOR REMAINING LTG 09/02/16) will report symptoms of pain, dizziness, fatigue of <2 after participating in one full yoga class   Status On-going     PT LONG TERM GOAL #2    Title Will improve functional leg strength as indicated by a decrease in 5 times sit <> stand time by 4 seconds. (6 seconds)   Baseline 6 seconds-GOAL MET 07/31/16   Status Achieved     PT LONG TERM GOAL #3   Title Pt will demonstrate improved balance and independence with community gait as indicated by gait velocity increasing to >4 ft/sec   Baseline 4.8 ft/sec-GOAL MET 07/31/16   Status Achieved     PT LONG TERM GOAL #5   Title improve FGA to >/= 28/30 for improved functional mobility   Baseline 27/30 on 07/31/16   Status Partially Met     PT LONG TERM GOAL #6   Title Pt will demonstrate ability to squat and bend down to pick up and carry 20lb weighted box >100' (up/down ramp/curb) Mod I (to simulate work requirements)   Time 4   Period Weeks   Status New     PT LONG TERM GOAL #7   Title Pt will improve functional endurance as indicated by improvement in 6 minute walk test by 263f at 12-13 RPE on BORG scale   Baseline 2500 outside over uneven grass and pavement   Status New               Plan - 08/31/16 1457    Clinical Impression Statement Pt with less frustration today but still seeking answers about prognosis of recovery and timeline for recovery.  Treatment session continued to focus on education regarding progression of activity level, symptom self monitoring and management, and progression of HEP.  Also progressed balance and UE/LE strengthening exercises to include more compliant surfaces and lifting/carrying to simulate work activities.     Rehab Potential Good   PT Treatment/Interventions ADLs/Self Care Home Management;Canalith Repostioning;Moist Heat;Gait training;Stair training;Functional mobility training;Therapeutic activities;Therapeutic exercise;Balance training;Neuromuscular re-education;Cognitive remediation;Patient/family education;Manual techniques;Passive range of motion;Energy conservation;Taping;Vestibular   PT Next Visit Plan Re-assess LTG-D/C?    Consulted and Agree with Plan of Care Patient      Patient will benefit from skilled therapeutic intervention in order to improve the following deficits and impairments:  Decreased activity tolerance, Decreased balance, Decreased cognition, Decreased endurance, Decreased strength, Difficulty walking, Dizziness, Pain  Visit Diagnosis: Unsteadiness on feet  Dizziness and giddiness  Difficulty in walking, not elsewhere classified     Problem List Patient Active Problem List   Diagnosis Date Noted  . Open nasal fracture 05/29/2016  . Facial laceration 05/29/2016  . Blunt head trauma 05/29/2016  . SIADH (syndrome of inappropriate ADH production) ([place] 05/29/2016  . TBI (traumatic  brain injury) ([place]) 05/17/2016  . Rupture of plantar fascia 08/19/2015  . Degenerative tear of posterior horn of medial meniscus 06/15/2015  . Right hand pain 08/06/2013  . Leg length inequality 09/17/2012  . Pain, joint, ankle and foot 09/26/2011    Raylene Everts, PT, DPT 08/31/16    3:01 PM    [facility] [address] [place], [place], [address] Phone: [phone]   Fax:  [phone]  Name: Annette Kidd MRN: [id number] Date of Birth: 03-30-1956

## 2016-09-04 ENCOUNTER — Ambulatory Visit: Payer: Managed Care, Other (non HMO) | Admitting: Physical Therapy

## 2016-09-06 ENCOUNTER — Encounter: Payer: Self-pay | Admitting: Neurology

## 2016-09-07 ENCOUNTER — Ambulatory Visit: Payer: Managed Care, Other (non HMO) | Attending: Orthopedic Surgery | Admitting: Speech Pathology

## 2016-09-07 ENCOUNTER — Encounter: Payer: Self-pay | Admitting: Physical Therapy

## 2016-09-07 ENCOUNTER — Ambulatory Visit: Payer: Managed Care, Other (non HMO) | Admitting: Physical Therapy

## 2016-09-07 DIAGNOSIS — R2681 Unsteadiness on feet: Secondary | ICD-10-CM | POA: Insufficient documentation

## 2016-09-07 DIAGNOSIS — R42 Dizziness and giddiness: Secondary | ICD-10-CM | POA: Insufficient documentation

## 2016-09-07 DIAGNOSIS — R262 Difficulty in walking, not elsewhere classified: Secondary | ICD-10-CM | POA: Diagnosis present

## 2016-09-07 DIAGNOSIS — R41841 Cognitive communication deficit: Secondary | ICD-10-CM | POA: Diagnosis not present

## 2016-09-07 NOTE — Therapy (Signed)
[facility] [address] [place], [place], [address] Phone: [phone]   Fax:  [phone]  Speech Language Pathology Treatment  Patient Details  Name: Annette Kidd MRN: [id number] Date of Birth: 07-23-1955 Referring Provider: Dr. Barnett Abu  Encounter Date: 09/07/2016      End of Session - 09/07/16 1210    Visit Number 10   SLP Start Time 0932   SLP Stop Time  1017   SLP Time Calculation (min) 45 min   Activity Tolerance Patient tolerated treatment well      No past medical history on file.  Past Surgical History:  Procedure Laterality Date  . APPENDECTOMY      There were no vitals filed for this visit.      Subjective Assessment - 09/07/16 0938    Subjective "We had a hard time figuring out the cube problem"   Currently in Pain? No/denies               ADULT SLP TREATMENT - 09/07/16 0941      General Information   Behavior/Cognition Alert;Cooperative;Pleasant mood     Treatment Provided   Treatment provided Cognitive-Linquistic     Pain Assessment   Pain Assessment No/denies pain     Cognitive-Linquistic Treatment   Treatment focused on Cognition   Skilled Treatment Pt somewhat emotional today, overwhelmed with course of treatment, unknown plans for nasal surgery, work and travel plans. Pt maintained appropriate related topics over 20 minutes. Moderately complex reasoning/problem solving with rare min A.     Assessment / Recommendations / Plan   Plan Continue with current plan of care     Progression Toward Goals   Progression toward goals Progressing toward goals              SLP Long Term Goals - 09/07/16 1210      SLP LONG TERM GOAL #1   Title Pt will divide attention betwen a moderately complex and a simple cognitive linguistic tasks with 90% on each and rare min A   Status Deferred  at baseline, per pt     SLP LONG TERM GOAL #2   Title Pt will perform complex  attention to detail tasks with 90% accuracy and rare min A   Time 2   Period Weeks   Status On-going     SLP LONG TERM GOAL #3   Title Pt will complete complex naming tasks with rare min A   Time 2   Period Weeks   Status Achieved     SLP LONG TERM GOAL #4   Title Pt will demo WNL topic mainenance over a 30 minute conversation in three consecutive sessions   Time 2   Period Weeks   Status Achieved          Plan - 09/07/16 1208    Clinical Impression Statement Pt continues to  make gains with high level cognition, antipcatory awareness and reduction of tangential topics. She reports lisp, reduced speech accuracy, however she is 100% intellgible. I provided her simple HEP for speech to do at home. Cotninue skilled ST likely 1-2 more sessions to maximize high level cogntion for possible return to work and QOL.    Speech Therapy Frequency 1x /week   Treatment/Interventions Cognitive reorganization;Internal/external aids;SLP instruction and feedback;Functional tasks;Patient/family education;Compensatory strategies;Language facilitation   Potential to Achieve Goals Good   Consulted and Agree with Plan of Care Patient      Patient will benefit from skilled  therapeutic intervention in order to improve the following deficits and impairments:   Cognitive communication deficit    Problem List Patient Active Problem List   Diagnosis Date Noted  . Open nasal fracture 05/29/2016  . Facial laceration 05/29/2016  . Blunt head trauma 05/29/2016  . SIADH (syndrome of inappropriate ADH production) ([facility]) 05/29/2016  . TBI (traumatic brain injury) ([facility]) 05/17/2016  . Rupture of plantar fascia 08/19/2015  . Degenerative tear of posterior horn of medial meniscus 06/15/2015  . Right hand pain 08/06/2013  . Leg length inequality 09/17/2012  . Pain, joint, ankle and foot 09/26/2011    Eural Holzschuh, Radene JourneyLaura Ann MS, CCC-SLP 09/07/2016, 12:11 PM  [facility] [address] [place], [place], [address] Phone: [phone]   Fax:  [phone]   Name: Annette Kidd MRN: [id number] Date of Birth: 1955-08-19

## 2016-09-07 NOTE — Patient Instructions (Signed)
  Processing may be a little slower - especially in rapid group conversation    Do exercises 20x each, 3x a day - in the mirror, slow and big  1. Alternate pucker and smile - OOO-EEE  2. Open mouth big Ahh-OOO with mouth open big  3. Pucker and move your lips side to side  4. Puff up your cheeks with air BIG - swish air from side to side  5. Press lips together flat and pop them open  6. Pucker and kiss big  7. Elvis lips  8. Snarl  9. Puff upper lip with air  Say the following slow and exaggerated - move your mouth big  [place]  BUTTERCUP  CATERPILLAR  BASEBALLL PLAYER  [place]  [place]  SLOW AND BIG - EXAGGERATE YOUR MOUTH, MAKE EACH CONSONANT   .

## 2016-09-07 NOTE — Therapy (Signed)
[facility] [address] [place], [place], [address] Phone: [phone]   Fax:  [phone]  Physical Therapy Treatment  Patient Details  Name: Annette Kidd MRN: [id number] Date of Birth: [birth year] Referring Provider: Lisette Abu, PA-C  Encounter Date: 09/07/2016      PT End of Session - 09/07/16 1207    Visit Number 21   Number of Visits 22   Date for PT Re-Evaluation 09/02/16   Authorization Type [employer]-60 visits combined PT/OT   Authorization - Visit Number 21   Authorization - Number of Visits 30   PT Start Time 1020   PT Stop Time 1106   PT Time Calculation (min) 46 min   Activity Tolerance Patient tolerated treatment well   Behavior During Therapy [facility] for tasks assessed/performed      History reviewed. No pertinent past medical history.  Past Surgical History:  Procedure Laterality Date  . APPENDECTOMY      There were no vitals filed for this visit.      Subjective Assessment - 09/07/16 1026    Subjective Pt had appointment with PCP who referred her to neurologist; still asking lots of questions about driving and length of time to heal.   Pertinent History *patient reports that she had an episode of 8/10 spinning dizziness approximately 15 years ago due to bacterial infection.     Limitations Other (comment);Walking;Standing   Patient Stated Goals Walk longer and return to regular activities-yoga, typing, gardening, hiking, fitness when cleared by MD   Currently in Pain? No/denies            [facility] PT Assessment - 09/07/16 1033      Observation/Other Assessments   Focus on Therapeutic Outcomes (FOTO)  84% (16% limited)   Neuro Quality of Life  47.8 LE     6 Minute Walk- Baseline   6 Minute Walk- Baseline yes   BP (mmHg) 115/68   HR (bpm) 67   02 Sat (%RA) 98 %   Modified Borg Scale for Dyspnea 0- Nothing at all   Perceived Rate of Exertion (Borg) 7- Very, very light     6 Minute  walk- Post Test   6 Minute Walk Post Test yes   BP (mmHg) 128/76   HR (bpm) 72   02 Sat (%RA) 99 %   Modified Borg Scale for Dyspnea 2- Mild shortness of breath   Perceived Rate of Exertion (Borg) 13- Somewhat hard     6 minute walk test results    Aerobic Endurance Distance Walked 2500   Endurance additional comments outside over tall grassy/uneven paved terrain     Functional Gait  Assessment   Gait assessed  Yes   Gait Level Surface Walks 20 ft in less than 5.5 sec, no assistive devices, good speed, no evidence for imbalance, normal gait pattern, deviates no more than 6 in outside of the 12 in walkway width.   Change in Gait Speed Able to smoothly change walking speed without loss of balance or gait deviation. Deviate no more than 6 in outside of the 12 in walkway width.   Gait with Horizontal Head Turns Performs head turns smoothly with no change in gait. Deviates no more than 6 in outside 12 in walkway width   Gait with Vertical Head Turns Performs head turns with no change in gait. Deviates no more than 6 in outside 12 in walkway width.   Gait and Pivot Turn Pivot turns safely within 3  sec and stops quickly with no loss of balance.   Step Over Obstacle Is able to step over 2 stacked shoe boxes taped together (9 in total height) without changing gait speed. No evidence of imbalance.   Gait with Narrow Base of Support Is able to ambulate for 10 steps heel to toe with no staggering.   Gait with Eyes Closed Walks 20 ft, no assistive devices, good speed, no evidence of imbalance, normal gait pattern, deviates no more than 6 in outside 12 in walkway width. Ambulates 20 ft in less than 7 sec.   Ambulating Backwards Walks 20 ft, no assistive devices, good speed, no evidence for imbalance, normal gait   Steps Alternating feet, no rail.   Total Score 30   FGA comment: 30/30                     [facility] Adult PT Treatment/Exercise - 09/07/16 1100      Therapeutic Activites     Therapeutic Activities Lifting;Work Economist                PT Education - 09/07/16 1206    Education provided Yes   Education Details goals met and progress made, ways to continue to focus on wellness, D/C from therapy   Person(s) Educated Patient   Methods Explanation   Comprehension Verbalized understanding          PT Short Term Goals - 07/06/16 1704      PT SHORT TERM GOAL #1   Title  (TARGET DATE FOR ALL STG 07/05/16) pt will be independent with HEP   Status Achieved     PT SHORT TERM GOAL #2   Title will demonstrate safe floor <> furniture transfer (for gardening/yoga) independently   Status Achieved     PT [place] #3   Title will ambulate >1000' over uneven surfaces outdoors with supervision and report symptoms of pain or dizziness as <2/10   Status Unable to assess     PT SHORT TERM GOAL #4   Title Will negotiate 12 stairs alternating sequence, no UE at supervision level   Baseline requires one UE support for 12 stairs or supervision for no UE support   Status Achieved     PT SHORT TERM GOAL #5   Title Will complete DGI and LTG set   Baseline FGA performed 06/13/16   Status Achieved           PT Long Term Goals - 09/07/16 1205      PT LONG TERM GOAL #1   Title (TARGET DATE FOR ALL LTG 08/05/16; NEW TARGET DATE FOR REMAINING LTG 09/02/16) will report symptoms of pain, dizziness, fatigue of <2 after participating in one full yoga class   Baseline deferred-pt not cleared from medical stand point to participate in yoga   Status Deferred     PT LONG TERM GOAL #5   Title improve FGA to >/= 28/30 for improved functional mobility   Baseline 30/30 on 5/3   Status Achieved     PT LONG TERM GOAL #6   Title Pt will demonstrate ability to squat and bend down to pick up and carry 20lb weighted box >100' (up/down ramp/curb) Mod I (to simulate work requirements)   Status Achieved     PT LONG TERM GOAL #7   Title Pt will improve functional endurance  as indicated by improvement in 6 minute walk test by 262f at 12-13 RPE on BORG scale  Baseline 2500 outside over uneven grass and pavement-remained unchanged    Status Not Met               Plan - 09/07/16 1208    Clinical Impression Statement Pt has made good progress and has met 5/7 overall LTG with yoga goal being deferred due to lack of medical clearance and 6 minute walk test was unchanged from initial assessment.  Pt has made improvements in strength, balance, gait and vestibular system with endurance remaining relatively unchanged with this re-certification.  Pt has progressed well and is motivated to initiate community wellness activities and focus on driving and work.  Pt agreeable to D/C today.   PT Treatment/Interventions ADLs/Self Care Home Management;Canalith Repostioning;Moist Heat;Gait training;Stair training;Functional mobility training;Therapeutic activities;Therapeutic exercise;Balance training;Neuromuscular re-education;Cognitive remediation;Patient/family education;Manual techniques;Passive range of motion;Energy conservation;Taping;Vestibular   PT Next Visit Plan D/C today   Consulted and Agree with Plan of Care Patient      Patient will benefit from skilled therapeutic intervention in order to improve the following deficits and impairments:  Decreased activity tolerance, Decreased balance, Decreased cognition, Decreased endurance, Decreased strength, Difficulty walking, Dizziness, Pain  Visit Diagnosis: Unsteadiness on feet  Dizziness and giddiness  Difficulty in walking, not elsewhere classified     Problem List Patient Active Problem List   Diagnosis Date Noted  . Open nasal fracture 05/29/2016  . Facial laceration 05/29/2016  . Blunt head trauma 05/29/2016  . SIADH (syndrome of inappropriate ADH production) ([place]) 05/29/2016  . TBI (traumatic brain injury) ([place]) 05/17/2016  . Rupture of plantar fascia 08/19/2015  . Degenerative tear of posterior  horn of medial meniscus 06/15/2015  . Right hand pain 08/06/2013  . Leg length inequality 09/17/2012  . Pain, joint, ankle and foot 09/26/2011   PHYSICAL THERAPY DISCHARGE SUMMARY  Visits from Start of Care: 21  Current functional level related to goals / functional outcomes: See above   Remaining deficits: Endurance, functional strength, disequilibrium   Education / Equipment: HEP  Plan: Patient agrees to discharge.  Patient goals were met. Patient is being discharged due to meeting the stated rehab goals.  ?????    Raylene Everts, PT, DPT 09/07/16    12:15 PM    [facility] [address] [place], [place], [address] Phone: [phone]   Fax:  [phone]  Name: DEMARIS BOUSQUET MRN: [id number] Date of Birth: [birth year]

## 2016-09-14 ENCOUNTER — Ambulatory Visit: Payer: Managed Care, Other (non HMO) | Admitting: Speech Pathology

## 2016-09-14 DIAGNOSIS — R41841 Cognitive communication deficit: Secondary | ICD-10-CM | POA: Diagnosis not present

## 2016-09-14 NOTE — Therapy (Signed)
[facility] [address] [place], [place], [address] Phone: [phone]   Fax:  [phone]  Speech Language Pathology Treatment  Patient Details  Name: Annette Kidd MRN: [id number] Date of Birth: 1955-08-14 Referring Provider: Dr. Kristeen Miss  Encounter Date: 09/14/2016      End of Session - 09/14/16 1418    Visit Number 11   Number of Visits 12   Date for SLP Re-Evaluation 09/29/16   SLP Start Time 7793   SLP Stop Time  1400   SLP Time Calculation (min) 42 min      No past medical history on file.  Past Surgical History:  Procedure Laterality Date  . APPENDECTOMY      There were no vitals filed for this visit.      Subjective Assessment - 09/14/16 1323    Subjective "I have to ask you about the Constant Therapy"   Currently in Pain? No/denies               ADULT SLP TREATMENT - 09/14/16 1324      General Information   Behavior/Cognition Alert;Cooperative;Pleasant mood     Treatment Provided   Treatment provided Cognitive-Linquistic     Pain Assessment   Pain Assessment No/denies pain     Cognitive-Linquistic Treatment   Treatment focused on Cognition   Skilled Treatment Pt reports fatigue after doing some work today. Facilitated complex reasoning and attention to detail with mod I. She verbalizes compensations for attention with mod I     Assessment / Recommendations / Plan   Plan All goals met     Progression Toward Goals   Progression toward goals Goals met, education completed, patient discharged from [facility] - 09/14/16 1413    Education provided Yes   Education Details goals met, progress made, continue cognitive activities at home   Person(s) Educated Patient   Methods Explanation   Comprehension Verbalized understanding     SPEECH THERAPY DISCHARGE SUMMARY  Visits from Start of Care: 11  Current functional level related to goals / functional  outcomes: See goals below   Remaining deficits: Mild high level cognitive changes in attention and word finding   Education / Equipment: Compensations for memory, attention and word finding;  Plan: Patient agrees to discharge.  Patient goals were met. Patient is being discharged due to meeting the stated rehab goals.  ?????            SLP Long Term Goals - 09/14/16 1418      SLP LONG TERM GOAL #1   Title Pt will divide attention betwen a moderately complex and a simple cognitive linguistic tasks with 90% on each and rare min A   Status Deferred  at baseline, per pt     SLP LONG TERM GOAL #2   Title Pt will perform complex attention to detail tasks with 90% accuracy and rare min A   Time 2   Period Weeks   Status Achieved     SLP LONG TERM GOAL #3   Title Pt will complete complex naming tasks with rare min A   Time 2   Period Weeks   Status Achieved     SLP LONG TERM GOAL #4   Title Pt will demo WNL topic mainenance over a 30 minute conversation in three consecutive sessions   Time 2   Period Weeks   Status Achieved  Plan - 09/14/16 1422    Clinical Impression Statement Pt has met all of her goals in ST, she reports still feeling like her word finding is "weak" however word finding episodes have not been observed over past 2-3 sessions. Annette Kidd has an appointment with neurologist in June and is questioning driving due to some slight visual vs vestibular issues.  Pt is highly intelligent and has very high expectations of herself and her cognitive function. Education that she will continue to improve with time. She will continue to do high leve, challenging cognitive tasks at home. I provided her a packet of complex reasoning/math based logic. She continues to do Constant Therapy app at home. D/c ST at this time.       Patient will benefit from skilled therapeutic intervention in order to improve the following deficits and impairments:   Cognitive communication  deficit    Problem List Patient Active Problem List   Diagnosis Date Noted  . Open nasal fracture 05/29/2016  . Facial laceration 05/29/2016  . Blunt head trauma 05/29/2016  . SIADH (syndrome of inappropriate ADH production) ([place]) 05/29/2016  . TBI (traumatic brain injury) ([place]) 05/17/2016  . Rupture of plantar fascia 08/19/2015  . Degenerative tear of posterior horn of medial meniscus 06/15/2015  . Right hand pain 08/06/2013  . Leg length inequality 09/17/2012  . Pain, joint, ankle and foot 09/26/2011    Bernardine Langworthy, Annye Rusk MS, CCC-SLP 09/14/2016, 2:23 PM  [facility] [address] [place], [place], [address] Phone: [phone]   Fax:  [phone]   Name: Annette Kidd MRN: [id number] Date of Birth: [birth year]

## 2016-09-21 ENCOUNTER — Ambulatory Visit: Payer: Managed Care, Other (non HMO) | Admitting: Speech Pathology

## 2016-10-30 ENCOUNTER — Ambulatory Visit (INDEPENDENT_AMBULATORY_CARE_PROVIDER_SITE_OTHER): Payer: Managed Care, Other (non HMO) | Admitting: Psychology

## 2016-10-30 DIAGNOSIS — S069X1S Unspecified intracranial injury with loss of consciousness of 30 minutes or less, sequela: Secondary | ICD-10-CM

## 2016-10-30 DIAGNOSIS — F419 Anxiety disorder, unspecified: Secondary | ICD-10-CM

## 2016-10-30 DIAGNOSIS — R413 Other amnesia: Secondary | ICD-10-CM

## 2016-10-30 NOTE — Progress Notes (Signed)
   Neuropsychology Note  Annette Kidd completed 1 hour of neuropsychological testing with technician, Annette Kidd, BS, under the supervision of Dr. Elvis CoilMaryBeth Bailar. Rest breaks were offered, but the patient still became very tired after one hour of testing and requested to finish on another date. She will return to complete the testing on Thursday, November 02, 2016. Annette Kidd will return next month, after her vacation, for a feedback session with Dr. Alinda DoomsBailar at which time her test performances, clinical impressions and treatment recommendations will be reviewed in detail. The patient understands she can contact our office should she require our assistance before this time.  Full report to follow.

## 2016-10-30 NOTE — Progress Notes (Signed)
NEUROPSYCHOLOGICAL INTERVIEW (CPT: [id number])  Name: Annette Kidd Date of Birth: April 29, 1956 Date of Interview: 10/30/2016  Reason for Referral:  Annette Kidd is a 61 y.o. female who is referred for neuropsychological evaluation by Dr. Maurice Small of [facility] at [facility] due to concerns about cognitive changes status post TBI in January 2018. This patient is accompanied in the office by her husband who supplements the history.  History of Presenting Problem:  Annette Kidd was in her usual state of good health when she was walking her dog on 05/17/2016 and a tree limb fell, hitting another branch, which ricocheted and hit her face causing her to fall to the ground. She lost consciousness. (The patient does not recall the tree branch hitting her or falling. The last thing she remembers is walking her dog. The first memory she has afterwards is being in the ICU. A friend who was with her witnessed the event, and landscapers who were working on the trees called EMS. She was brought to [facility] ED by EMS with a GCS of 12 and borderline hypotension. On arrival her GCS was 14. She sustained nasal fracture. Head CT revealed left tentorial subdural hematoma, with no mass effect or midline shift. Neurosurgery recommended non-operative treatment of her brain injury. A repeat CT on 05/23/2016 showed decreased size of small left tentorial subdural hematoma. Over the course of her hospitalization she developed SIADH which proved difficult to treat. Nephrology was consulted and this was eventually controlled. She was moderately symptomatic from her brain injury which waxed and waned over the course of her hospitalization. She was evaluated by the TBI therapy team who noted steady progress. Once her sodium had stabilized she was discharged home on 05/29/2016. She underwent outpatient PT, OT and speech/cognitive therapy, all of which were completed by the beginning of May.   The patient demonstrated improvement in  functioning over time since her injury. She is pretty active during the day with household tasks. She initially slept a lot and energy level was too low to do very much. Over time, activity level increased. She still complains of reduced energy. She takes a daytime nap after lunch. She continues to have some vertigo which increases throughout the day and occurs when she turns her head quickly. This has worried her about returning to driving (she has not yet returned to driving and is considering completing a driving evaluation with an OT). She Iis managing the finances, her appointments and cooking without any problems. She is not taking medications but she is able to manage her vitamins without any problems. She is able to do laundry and household tasks. She is a Tax inspector (self employed). She finished up some jobs that were started before her injury but she has not started any new jobs. She does not feel she has the energy or enthusiasm to do so. She and her husband used to entertain friends at their home regularly but she has not had the energy to do this either.   Cognitively, she reports that her memory and word retrieval are not back to baseline although they have improved over time since the injury. She does not forget recent events or conversations. She no longer repeats questions. She is not misplacing or losing items. She is not having difficulty with attention or concentration but she does get tired more easily. Her husband feels her information processing is still a bit reduced, especially when she is tired. She has always been somewhat tangential or nonlinear  in conversation/communication, per her husband. Cognitively, the patient is mostly concerned about word finding difficulties and she admits she is likely more sensitive to this than most people because she comes from a "background of literary association and large vocabulary". Her husband agrees that her recall of words and names is not  what it was before, but it is still better than average and better than his. Annette Kidd denies difficulty with visual-spatial perception or navigation. She complains of a general sense of "haziness" or "fog" in her head that keeps her from doing as much as she used to.  Emotionally, she reports that she is "beginning to get angry" that the injury occurred and affected her. She states, "I didn't realize how long it would take [to recover], or that it might change me permanently." Since the injury she has felt more down. She reports that she has always been a somewhat anxious person and she is not necessarily more anxious now but she is worried that her symptoms won't improve and the "fogginess won't ever go away". She has no history of mental health treatment. She has considered whether therapy might be helpful for her. She denies any problems with sleep or appetite. Her husband denies any personality changes.   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 Very mild word finding difficulty but it is upsetting to the patient when it occurs. Thought process: Nonlinear, with difficulty staying on topic but responds well to cueing/structure.  Affect: Full, anxious Interpersonal: Pleasant, appropriate   TESTING: There is medical necessity to proceed with neuropsychological assessment as  the results will be used to aid in differential diagnosis and clinical decision-making and to inform specific treatment recommendations. 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  Following the clinical interview, the patient completed a full battery of neuropsychological testing with my psychometrician under my supervision.   PLAN: The patient will return to see me for a follow-up session at which time her test performances and my impressions and treatment recommendations will be reviewed in detail.  Full report to follow.

## 2016-10-31 ENCOUNTER — Encounter: Payer: Self-pay | Admitting: Psychology

## 2016-11-02 ENCOUNTER — Ambulatory Visit (INDEPENDENT_AMBULATORY_CARE_PROVIDER_SITE_OTHER): Payer: Managed Care, Other (non HMO) | Admitting: Psychology

## 2016-11-02 DIAGNOSIS — S069X1S Unspecified intracranial injury with loss of consciousness of 30 minutes or less, sequela: Secondary | ICD-10-CM

## 2016-11-02 NOTE — Progress Notes (Signed)
   Neuropsychology Note  Annette Kidd returned today for 2 hours of neuropsychological testing with technician, Annette Kidd, BS, under the supervision of Dr. Elvis CoilMaryBeth Bailar. The patient did not appear overtly distressed by the testing session, per behavioral observation or via self-report to the technician. Rest breaks were offered. Annette GillesLee P Kidd will return next month, after her vacation, for a feedback session with Dr. Alinda DoomsBailar at which time her test performances, clinical impressions and treatment recommendations will be reviewed in detail. The patient understands she can contact our office should she require our assistance before this time.  Full report to follow.

## 2016-11-23 ENCOUNTER — Telehealth: Payer: Self-pay | Admitting: Psychology

## 2016-11-23 NOTE — Telephone Encounter (Signed)
Hi Dr. Alinda DoomsBailar This patient called and left a message regarding needing a Formal  Driving Evaluation Assessment . She needs the referral from our office. She left a fax # [phone] for [facility].  Please Advise.  Thanks

## 2016-11-24 NOTE — Telephone Encounter (Signed)
I will send the referral on Monday when I return to the office. Thanks!

## 2016-12-05 ENCOUNTER — Encounter: Payer: Managed Care, Other (non HMO) | Admitting: Psychology

## 2016-12-09 NOTE — Progress Notes (Signed)
NEUROPSYCHOLOGICAL EVALUATION   Name:    Annette Kidd  Date of Birth:   1956/01/18 Date of Interview:  10/30/2016 Date of Testing:  10/30/2016 and 11/02/2016   Date of Feedback:  12/12/2016       Background Information:  Reason for Referral:  Annette Kidd is a 61 y.o. female referred by Dr. Maurice Small to assess her current level of cognitive functioning and assist in differential diagnosis. The current evaluation consisted of a review of available medical records, an interview with the patient and her husband, and the completion of a neuropsychological testing battery. Informed consent was obtained.  History of Presenting Problem:  Annette Kidd was in her usual state of good health when she was walking her dog on 05/17/2016 and a tree limb fell, hitting another branch, which ricocheted and hit her face causing her to fall to the ground. She lost consciousness. (The patient does not recall the tree branch hitting her or falling. The last thing she remembers is walking her dog. The first memory she has afterwards is being in the ICU. A friend who was with her witnessed the event, and landscapers who were working on the trees called EMS. She was brought to [facility] ED by EMS with a GCS of 12 and borderline hypotension. On arrival her GCS was 14. She sustained nasal fracture. Head CT revealed left tentorial subdural hematoma, with no mass effect or midline shift. Neurosurgery recommended non-operative treatment of her brain injury. A repeat CT on 05/23/2016 showed decreased size of small left tentorial subdural hematoma. Over the course of her hospitalization she developed SIADH which proved difficult to treat. Nephrology was consulted and this was eventually controlled. She was moderately symptomatic from her brain injury which waxed and waned over the course of her hospitalization. She was evaluated by the TBI therapy team who noted steady progress. Once her sodium had stabilized she was discharged  home on 05/29/2016. She underwent outpatient PT, OT and speech/cognitive therapy, all of which were completed by the beginning of May.   The patient demonstrated improvement in functioning over time since her injury. She is pretty active during the day with household tasks. She initially slept a lot and energy level was too low to do very much. Over time, activity level increased. She still complains of reduced energy. She takes a daytime nap after lunch. She continues to have some vertigo which increases throughout the day and occurs when she turns her head quickly. This has worried her about returning to driving (she has not yet returned to driving and is considering completing a driving evaluation with an OT). She Iis managing the finances, her appointments and cooking without any problems. She is not taking medications but she is able to manage her vitamins without any problems. She is able to do laundry and household tasks. She is a Tax inspector (self employed). She finished up some jobs that were started before her injury but she has not started any new jobs. She does not feel she has the energy or enthusiasm to do so. She and her husband used to entertain friends at their home regularly but she has not had the energy to do this either.   Cognitively, she reports that her memory and word retrieval are not back to baseline although they have improved over time since the injury. She does not forget recent events or conversations. She no longer repeats questions. She is not misplacing or losing items. She is not  having difficulty with attention or concentration but she does get tired more easily. Her husband feels her information processing is still a bit reduced, especially when she is tired. She has always been somewhat tangential or nonlinear in conversation/communication, per her husband. Cognitively, the patient is mostly concerned about word finding difficulties and she admits she is likely more  sensitive to this than most people because she comes from a "background of literary association and large vocabulary". Her husband agrees that her recall of words and names is not what it was before, but it is still better than average and better than his. [name] denies difficulty with visual-spatial perception or navigation. She complains of a general sense of "haziness" or "fog" in her head that keeps her from doing as much as she used to.  Emotionally, she reports that she is "beginning to get angry" that the injury occurred and affected her. She states, "I didn't realize how long it would take [to recover], or that it might change me permanently." Since the injury she has felt more down. She reports that she has always been a somewhat anxious person and she is not necessarily more anxious now but she is worried that her symptoms won't improve and the "fogginess won't ever go away". She has no history of mental health treatment. She has considered whether therapy might be helpful for her. She denies any problems with sleep or appetite. Her husband denies any personality changes.   Social History: Born/Raised: [place], [place] Education: Engineer, maintenance (IT) - bachelor's degree, and then an associates in [employer] after college (in [employer]) Occupational history: Animal nutritionist (self employed) Marital history: Married x29 years, 1 daughter Alcohol: couple of glasses of wine every night Tobacco: Never   Medical History: Traumatic brain injury 05/2016   Current Medications:  No medications Only vitamins  Current Examination:  Behavioral Observations:  Appearance: Neatly, casually and appropriately dressed and groomed Gait: Ambulated independently, no gross abnormalities observed Speech: Fluent; normal rate, rhythm and volume. Very mild word finding difficulty but it is upsetting to the patient when it occurs. Thought process: Nonlinear, with difficulty  staying on topic but responds well to cueing/structure.  Affect: Full, anxious Interpersonal: Pleasant, appropriate Orientation: Oriented to all spheres. Accurately named the current President and his predecessor.  Of note, the patient became very fatigued after an hour of testing on the first day and had to discontinue and return a different day to complete the battery.  Tests Administered: . Test of Premorbid Functioning (TOPF) . Wechsler Adult Intelligence Scale-Fourth Edition (WAIS-IV): Similarities, Information, Block Design, Matrix Reasoning, Arithmetic, Symbol Search, Coding and Digit Span subtests . Wechsler Memory Scale-Fourth Edition (WMS-IV) Adult Version (ages 28-69): Logical Memory I, II and Recognition subtests  . [place] Verbal Learning Test - 2nd Edition (CVLT-2) Standard Form . Conners Continuous Performance Test 3rd Edition (CPT3) . [employer] (WCST) . Repeatable Battery for the Assessment of Neuropsychological Status (RBANS) Form A:  Figure Copy and Figure Recall subtests and Semantic Fluency subtest . Neuropsychological Assessment Battery (NAB) Language Module, Form 1: Naming subtest . Controlled Oral Word Association Test (COWAT) . Trail Making Test A and B . [place] Diagnostic Aphasia Examination (BDAE): Complex Ideational Material Subtest . Clock Drawing Test . Beck Depression Inventory - Second edition (BDI-II) . Beck Anxiety Inventory (BAI) . Generalized Anxiety Disorder - 7 item screener (GAD-7)  Test Results: Note: Standardized scores are presented only for use by appropriately trained professionals and to allow for any  future test-retest comparison. These scores should not be interpreted without consideration of all the information that is contained in the rest of the report. The most recent standardization samples from the test publisher or other sources were used whenever possible to derive standard scores; scores were corrected for age,  gender, ethnicity and education when available.   Test Scores:  Test Name Raw Score Standardized Score Descriptor  TOPF 68/70 SS= 127 Superior  WAIS-IV Subtests     Similarities 32/36 ss= 14 Superior  Information 18/26 ss= 12 High average  Block Design 49/66 ss= 13 High average  Matrix Reasoning 23/26 ss= 16 Very superior  Arithmetic 20/22 ss= 15 Superior  Symbol Search 27/60 ss= 10 Average  Coding [id number] ss= 11 Average  Digit Span 42/48 ss= 18 Very superior  WAIS-IV Index Scores     Verbal Comprehension  SS= 116 High average  Perceptual Reasoning  SS= 127 Superior  Working Memory  SS= 136 Very superior  Processing Speed  SS= 102 Average  Full Scale IQ (8 subtest)  SS= 126 Superior  WMS-IV Subtests     LM I 36/50 ss= 14 Superior  LM II 34/50 ss= 15 Superior  LM II Recognition 30/30 Cum %: >75 Above average  CVLT-II Scores     Trial 1 4/16 Z= -1 Low average  Trial 5 14/16 Z= 1 High average  Trials 1-5 total 51/80 T= 55 Average  SD Free Recall 6/16 Z= -1 Low average  SD Cued Recall 10/16 Z= -0.5 Average  LD Free Recall 8/16 Z= -1 Low average  LD Cued Recall 10/16 Z= -0.5 Average  Recognition Discriminability 15/16, 0 false positives Z= 1 High average  Forced Choice Recognition 16/16  WNL  CPT3     Detectability  T= 52 Average  Omissions  T= 47 Average  Commissions  T= 52 Average  Perseverations  T= 47 Average  WCST     Total Errors 16 T= 49 Average  Perseverative Responses 13 T= 40 Low average  Perseverative Errors 11 T= 41 Low average  Conceptual Level Responses 41 T= 43 Average  Categories Completed 2 11-16% Below average  Trials to Complete 1st Category 11 >16% WNL  Failure to Maintain Set 2  Below expectation  RBANS Subtests     Figure Copy 20/20 Z= 1.1 High average  Figure Recall 12/20 Z= -0.4 Average  Semantic Fluency 22 Z= 0.2 Average  NAB Language Naming 31/31 T= 55 Average  COWAT-FAS 49 T= 56 Average  COWAT-Animals 20 T= 54 Average  Trail Making Test A   32" 1 error T= 53 Average  Trail Making Test B 49" 0 errors T= 58 High average  BDAE Complex Ideational Material 12/12  WNL  Clock Drawing Test   WNL  BDI-II 7/63  WNL  BAI 12/63  Mild  GAD-7 3/21  WNL     Description of Test Results:  Premorbid verbal intellectual abilities were estimated to have been within the superior range based on a test of word reading. Her current full scale IQ fell within the superior range. There was significant variability among indices of her FSIQ. Specifically, processing speed was significantly lower than other aspects of her FSIQ including verbal comprehension, perceptual reasoning and working memory.   Psychomotor processing speed was average, and as mentioned above, this was a relative weakness compared to many other aspects of cognitive function. Auditory attention and working memory were very superior. On a test of sustained visual attention, she performed within normal limits  with no indications of inattentiveness, impulsivity, problems with sustained attention or problems with vigilance. Visual-spatial construction was high average. Language abilities were within normal limits. Specifically, confrontation naming was intact, and semantic verbal fluency was average. Auditory comprehension of complex ideational material was intact. With regard to verbal memory, encoding and acquisition of non-contextual information (i.e., 16-item word list) was average overall. She demonstrated a steep positive learning curve across the five learning trials (i.e., 4/16 items on Trial 1 to 14/16 items on Trial 5). After an interference task, free recall was low average (6/10 items recalled) and demonstrated retroactive interference in memory retrieval. With semantic cueing, recall improved to the average range (10/16 items recalled). After a delay, free recall was low average (8/16 items recalled). Cued recall was average (10/16 items recalled). Across all recall trials, the patient  committed an elevated number of repetition errors but no intrusion errors, suggesting possible low self-monitoring and/or high anxiety. Performance on a yes/no recognition task was high average. On another verbal memory test, encoding and acquisition of contextual auditory information (i.e., short stories) was superior. After a delay, free recall was superior, with excellent retention of previously encoded information. Performance on a yes/no recognition task was well above average with 100% accuracy. With regard to non-verbal memory, delayed free recall of visual information was average. Executive functioning was somewhat variable. Mental flexibility and set-shifting were high average on Trails B. Verbal fluency with phonemic search restrictions was average. Verbal abstract reasoning was superior. Non-verbal abstract reasoning was very superior. Performance on a clock drawing task was intact. She performed below expectation on a test of deductive reasoning and problem solving. Specifically, while she easily and quickly identified the first "rule" or pattern, she had great difficulty identifying the second pattern when the rule changed, and she demonstrated difficulty with maintaining set.   On a self-report measure of mood, the patient's responses were not indicative of clinically significant depression at the present time. On a self-report measure of generalized anxiety, the patient did not endorse clinically significant symptoms at the present time. On a self-report measure of various symptoms that can indicate various forms of anxiety, she did endorse several physical symptoms that could be secondary to anxiety and/or postconcussion syndrome. These included: dizziness/lightheaded (severe), unsteadiness (moderate), difficulty in breathing (moderate), faintness (moderate), and numbness or tingling (mild). She also endorsed mild difficulty relaxing and mild fear of the worst happening.   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 Results of neurocognitive testing revealed many areas of above-average functioning. No performances were in the impaired range compared to age-corrected normative sample, but there are some areas of probable decline relative to her own baseline abilities.  Specifically, relative to estimated premorbid abilities in the superior range, declines were noted in processing speed (commonly one of the last things to improve after head injury) and aspects of executive functioning (e.g., inefficient encoding/retrieval on list learning task with retroactive interference; deductive reasoning on WCST). This cognitive profile is suggestive of mild frontal-subcortical dysfunction, and is consistent with her history of mild TBI 7 months ago.  Areas of deficits likely will continue to improve to some degree. Additionally, cognitive symptoms are likely moderated by adjustment related anxiety, and if anxiety level is improved this would likely enhance daily cognitive functioning and her perception of it.   Recommendations/Plan: Based on the findings of the present evaluation, the following recommendations are offered:  1. From a neurocognitive standpoint, she is cleared to drive. 2.  Treatment for adjustment related anxiety/depressed mood is indicated. She accepted a referral to psychologist Dr. Kieth Brightlyodenbough and I will gladly place this order for her.  3. Behavioral strategies to compensate for areas of cognitive weakness were provided and reviewed. 4. She was encouraged to gradually increase cognitive activity and return to work gradually, utilizing compensatory strategies.   Feedback to Patient: Sherrilee GillesLee P Fraley and her husband returned for a feedback appointment on 12/12/2016 to review the results of her neuropsychological evaluation with this provider. 45 minutes face-to-face time was spent reviewing her test results, my impressions and my  recommendations as detailed above.    Total time spent on this patient's case: [id number] unit for interview with psychologist; [phone] units of testing by psychometrician under psychologist's supervision; [phone] units for medical record review, scoring of neuropsychological tests, interpretation of test results, preparation of this report, and review of results to the patient by psychologist.      Thank you for your referral of Sherrilee GillesLee P Hottenstein. Please feel free to contact me if you have any questions or concerns regarding this report.

## 2016-12-12 ENCOUNTER — Ambulatory Visit (INDEPENDENT_AMBULATORY_CARE_PROVIDER_SITE_OTHER): Payer: Managed Care, Other (non HMO) | Admitting: Psychology

## 2016-12-12 ENCOUNTER — Encounter: Payer: Self-pay | Admitting: Psychology

## 2016-12-12 DIAGNOSIS — IMO0001 Reserved for inherently not codable concepts without codable children: Secondary | ICD-10-CM

## 2016-12-12 DIAGNOSIS — G3184 Mild cognitive impairment, so stated: Secondary | ICD-10-CM | POA: Diagnosis not present

## 2016-12-12 DIAGNOSIS — S069X9S Unspecified intracranial injury with loss of consciousness of unspecified duration, sequela: Secondary | ICD-10-CM

## 2016-12-12 NOTE — Patient Instructions (Signed)
Strengths / WNL Relative Weakness  Abstract reasoning (both verbal and nonverbal) Processing speed (commonly one of the last things to resolve/improve after a head injury)   Learning and memory for new information that is "contextual"  Some aspects of executive functioning . With "non-contextual information": Inefficient learning/retrieval with retroactive interference . Deductive reasoning/Problem solving   Visual-spatial skills    Attention span / Working Systems developermemory    Language abilities / Emergency planning/management officeremantic retrieval      Mental flexibility / Set shifting      On testing, none of your performances are in the impaired range compared to same-age peers, but relative to your superior level of intellectual function (i.e., baseline ability), I do think there is some mild decline in aspects of executive functioning and processing speed. These are commonly associated with head injury. These areas are likely to continue improving.   Additionally, cognitive symptoms may be moderated by adjustment-related anxiety (e.g., worry about prognosis, hypervigilance of cognitive functioning). If anxiety level is improved, this would likely enhance cognitive functioning and your perception of it.  From a neurocognitive standpoint, you are cleared to drive.  I recommend you gradually increase cognitive activity with a gradual return to work, utilizing strategies below.   Cognitive strategies recommended based on your test performance: 1. Increased time to complete tasks  2. For cognitive fatigue: Pacing of cognitive tasks, Planning steps ahead of time, Building in breaks   3. Additional recommendations below  Strategies to enhance cognitive functioning Attention, concentration, memory encoding and consolidation    . Make a plan and be prepared o If you find that you are more attentive at certain times of the day (i.e., the morning), plan important activities and discussions at that time o Determine which  activities take the most time and which are most important, then prioritize your "to do list" based on this information o Break tasks into simpler parts, understand the steps involved before starting o Rehearse the steps mentally or write them down. If you write them down, you can use this as a checklist to check off as you complete them. o Visualize completing the task  . Use external aids  o Write everything down that you do not need to know or work with right now. Don't store extra information in your brain that you don't need right now.  o Use a calendar or planner to make checklists, due dates and "to do" lists. o Use ONLY ONE calendar or planner and look at it frequently o Set alarms for important deadlines or appointments  . Minimize interruptions and distractions  o Find a good work environment, e.g., quiet room with a desk, close curtains, use earplugs, mask sounds with a fan or white noise machine o Turn off cell phone and/or email alerts during important tasks. In fact, it is helpful to schedule a block of time each day where you limit phone and email interruptions and focus on just the more detailed work you have. o Try to minimize the amount of background noise (i.e., television, music) when engaged in important tasks or conversations with others (note that some individuals find soft background music helpful in minimize distraction, so you may need to experiment with optimal level of noise for specific situations)  . Use active effort = consciously attending to details, closely analyzing o Failures of encoding may reflect failure to attend to one's own actions o Be prepared to work more slowly than you usually do  o When reading, allow time for  re-reading sections  o Check your work for errors  . Avoid multitasking o Do not attempt to complete more than one task at one time. Focus on one task until it is completed and then move on to the next one. o Avoid other activities while  engaged in important tasks, such as talking on the phone while balancing the checkbook, making a shopping list during a meeting.   . Use self-talk during tasks o Repeat the steps of the activity to yourself as you complete them o Talk to yourself about your progress o This helps you maintain focus on the task and makes it easier to remember completing the task (Similar to "active effort" above)  . Conserve energy o Conserve energy to avoid fatigue and its effects on cognition - Get enough sleep - Pace yourself  and make sure to take breaks - Be open to receiving help - Exercise for increased energy  . Conversational vigilance = paying attention during a conversation  o Listen actively: focus on the speaker and position yourself so that you can clearly hear the him/her, and have open/relaxed posture  o Eye contact: Maintaining eye contact with the person you are speaking with may increase the likelihood that important information is properly received  o Ask questions: Ask questions for clarification (e.g., request that the speaker explain something in a different way) or ask for information to be repeated if you become distracted, or if you do not hear or understand something during a conversation  o Paraphrase: Summarize or repeat back important information from a conversation in your own words to facilitate communication and ensure that you have heard correctly and understand

## 2016-12-18 ENCOUNTER — Other Ambulatory Visit (HOSPITAL_COMMUNITY)
Admission: RE | Admit: 2016-12-18 | Discharge: 2016-12-18 | Disposition: A | Discharge status: Home / Self Care | Payer: Managed Care, Other (non HMO) | Source: Ambulatory Visit | Attending: Family Medicine | Admitting: Family Medicine

## 2016-12-18 ENCOUNTER — Other Ambulatory Visit: Payer: Self-pay | Admitting: Family Medicine

## 2016-12-18 DIAGNOSIS — Z124 Encounter for screening for malignant neoplasm of cervix: Secondary | ICD-10-CM | POA: Insufficient documentation

## 2016-12-20 ENCOUNTER — Other Ambulatory Visit: Payer: Self-pay | Admitting: Family Medicine

## 2016-12-20 DIAGNOSIS — Z1231 Encounter for screening mammogram for malignant neoplasm of breast: Secondary | ICD-10-CM

## 2016-12-20 LAB — CYTOLOGY - PAP: Diagnosis: NEGATIVE

## 2017-01-09 ENCOUNTER — Ambulatory Visit
Admission: RE | Admit: 2017-01-09 | Discharge: 2017-01-09 | Disposition: A | Discharge status: Home / Self Care | Payer: Managed Care, Other (non HMO) | Source: Ambulatory Visit | Attending: Family Medicine | Admitting: Family Medicine

## 2017-01-09 DIAGNOSIS — Z1231 Encounter for screening mammogram for malignant neoplasm of breast: Secondary | ICD-10-CM

## 2017-01-10 ENCOUNTER — Other Ambulatory Visit: Payer: Self-pay | Admitting: Family Medicine

## 2017-01-10 DIAGNOSIS — R928 Other abnormal and inconclusive findings on diagnostic imaging of breast: Secondary | ICD-10-CM

## 2017-01-15 ENCOUNTER — Ambulatory Visit
Admission: RE | Admit: 2017-01-15 | Discharge: 2017-01-15 | Disposition: A | Discharge status: Home / Self Care | Payer: Managed Care, Other (non HMO) | Source: Ambulatory Visit | Attending: Family Medicine | Admitting: Family Medicine

## 2017-01-15 ENCOUNTER — Other Ambulatory Visit: Payer: Managed Care, Other (non HMO)

## 2017-01-15 ENCOUNTER — Ambulatory Visit: Payer: Managed Care, Other (non HMO)

## 2017-01-15 DIAGNOSIS — R928 Other abnormal and inconclusive findings on diagnostic imaging of breast: Secondary | ICD-10-CM

## 2017-01-18 ENCOUNTER — Encounter: Payer: Managed Care, Other (non HMO) | Attending: Psychology | Admitting: Psychology

## 2017-01-18 DIAGNOSIS — S069X3A Unspecified intracranial injury with loss of consciousness of 1 hour to 5 hours 59 minutes, initial encounter: Secondary | ICD-10-CM

## 2017-01-18 DIAGNOSIS — X58XXXA Exposure to other specified factors, initial encounter: Secondary | ICD-10-CM | POA: Diagnosis not present

## 2017-01-18 DIAGNOSIS — F321 Major depressive disorder, single episode, moderate: Secondary | ICD-10-CM | POA: Diagnosis not present

## 2017-01-18 DIAGNOSIS — F0781 Postconcussional syndrome: Secondary | ICD-10-CM

## 2017-01-31 ENCOUNTER — Encounter: Payer: Self-pay | Admitting: Psychology

## 2017-01-31 NOTE — Progress Notes (Signed)
Neuropsychological Consultation   Patient:   Annette Kidd   DOB:   Sep 04, 1955  MR Number:  [id number]  Location:  [facility] FOR PAIN AND [facility] MEDICINE [facility] PHYSICAL MEDICINE AND REHABILITATION [address] [place] [address] [id number] [place] [address] Dept: [phone]           Date of Service:   01/18/2017  Start Time:   9 AM End Time:   10 AM  Provider/Observer:  Arley Phenix, Psy.D.       Clinical Neuropsychologist       Billing Code/Service: Psychological diagnostic clinical interview  Chief Complaint:    The patient comes in today reporting that she has continued to have negative feelings including depression and anxiety about her recent head trauma/TBI and how these issues may affect her future life. The patient reports that she lacks interest and motivation for past activities. Patient reports that major stressors right now have to do with work and not having the energy or strength to complete basic past. She reports that these symptoms of low energy and low and these he has some also include lack of motivation. The patient reports that she is more irritable and experiences depression and anxiety along with her ongoing cognitive difficulties including memory problems and changes in information processing speed and poor concentration.  Reason for Service:  [name] was referred by Dr. Koleen Distance for psychotherapeutic interventions. The patient had seen Dr. Alinda Dooms for neuropsychological evaluation back in the summer for formal evaluation due to residual effects of her traumatic brain injury. The patient describes an incident where she was walking her dog in January 2018 and a tree limb fell hitting another branch. Apparently, the collision between these 2 treatment of his resulted in the patient getting hit in the face causing her to fall to the ground. Apparently there was a loss of consciousness. The patient has no recall for the  events at the time of the specific impact and does not remember events after this until she was being treated in the ICU. Reviewing imaging in her medical records from May 17, 2016 leave an impression of acute left temporal subdural hematoma. They identified a mildly displaced nasal bone fracture as well. There was a nondisplaced fracture involving the left lateral maxillary incisor. Follow-up CT scan conducted on January 16 showed a small decreased size  left tentorial subdural hematoma.  While the patient does report that she feels like she is improved her cognitive function over time there continue to be a number of residual effects that she continues to experience. The patient describes increased and ongoing issues of anxiety and depression around the loss of function, changes in energy level, enthusiasm and lack of motivation, memory problems, irritability, excessive worrying, low energy, and poor concentration. She feels that the residual effects of this traumatic brain injury are having a significant effect on her life. The patient reports that she "feels compromised periods" she reports that she also continues to have balance and interference issues.  The patient had a neuropsychological evaluation completed in August 2018. I will not go over the totality of these results as they are available in her medical records. However, I have reviewed the raw data presented and would concur that there are indications that the patient had been doing quite well cognitively throughout her life. Many of her hold tests, which are measures that tended be stable over time were often in the superior range of functioning. The most sensitive  measures 2 traumatic brain injury and postconcussive syndrome were found to be significantly below the levels that would be predicted on her other neuropsychological test measures. The gated data indicates that there are issues with reasoning and problem-solving, and overall speed  of mental operations, and some encoding deficits.   Current Status:  [name] reports that she is continuing to have some significant neuro behavioral and emotional disturbance following a traumatic brain injury/significant concussive event that occurred in January 2018. The patient is experiencing reduced information processing speed, difficulties with encoding abilities, and some executive functioning deficits. Her intellectual measures that are often stable even after concussive effects were all in the superior range suggesting that she has been very bright individual who is quite capable and functional throughout the years. The patient reports that she experiences ongoing issues of increased depression anxiety although she does acknowledge she has had some anxiety issues in the past. The patient reports that her frustration, lack of motivation, and low energy are causing her some significant ongoing difficulties. She has an underlying fear that she will not return back to baseline functioning.  Reliability of Information: Information is derived from 1 hour face-to-face clinical interview with the patient as well as review of available medical records.  Behavioral Observation: PAYTEN HOBIN  presents as a 61 y.o.-year-old Right Caucasian Female who appeared her stated age. her dress was Appropriate and she was Well Groomed and her manners were Appropriate to the situation.  her participation was indicative of Appropriate behaviors.  There were not any physical disabilities noted.  she displayed an appropriate level of cooperation and motivation.     Interactions:    Active Appropriate and Redirectable  Attention:   abnormal and attention span appeared shorter than expected for age  Memory:   within normal limits; while the patient's functional memory during the clinical interview appeared to be appropriate she does have significant retrograde an enteric grade amnesia around the events in January  2018.  Visuo-spatial:  within normal limits  Speech (Volume):  normal  Speech:   normal; normal  Thought Process:  Coherent and Relevant  Though Content:  WNL; not suicidal  Orientation:   person, place, time/date and situation  Judgment:   Good  Planning:   Good  Affect:    Angry, Anxious and Depressed  Mood:    Anxious and Depressed  Insight:   Good  Intelligence:   very high  Marital Status/Living: The patient was born in [place] and grew up in [place] and [place]. The patient is married and has one child who is a 62 year old female. She currently lives with her husband.  Substance Use:  No concerns of substance abuse are reported.  But the patient does report averaging about 2 glasses of alcohol per day. She denies any significant increase recently.  Education:   Control and instrumentation engineer History:  No past medical history on file.      Psychiatric History:  The patient denies any past psychiatric history.  Family Med/Psych History: No family history on file.  Risk of Suicide/Violence: low the patient denies any current suicidal or homicidal ideation.  Impression/DX:  The patient comes in today reporting that she has continued to have negative feelings including depression and anxiety about her recent head trauma/TBI and how these issues may affect her future life. The patient reports that she lacks interest and motivation for past activities. Patient reports that major stressors right now have to do with work and not having the  energy or strength to complete basic past. She reports that these symptoms of low energy and low and these he has some also include lack of motivation. The patient reports that she is more irritable and experiences depression and anxiety along with her ongoing cognitive difficulties including memory problems and changes in information processing speed and poor concentration.  Given the fact that the patient showed specific imaging of a subdural  hematoma would classify her formal diagnosed as traumatic brain injury although it is likely that the majority of the residual neuropsychological deficits have to do with postconcussive syndrome. There are lingering neuro behavioral changes and emotional disturbance that may not be simply due to difficulties adjusting to the functional changes the patient is experiencing but may also be residual effects of the postconcussion syndrome.  Given the fact that we are now 9 months after the event and she is having the same or worsening symptoms of depression and anxiety and would use a diagnosis of major depressive disorder. However, he could just as easily be diagnosed as an adjustment disorder with both depression anxiety as the primary stressor is still present even though there is been more than 9 months since onset.  Disposition/Plan:  We'll set the patient up for individual psychotherapeutic interventions and neuropsychological consultation to help the patient better cope and adjust to the residual neuropsychological deficits following a traumatic brain injury/post concussive syndrome. The patient did have a subdural hematoma imaged on CT exam and is continued to have improving but residual neuropsychological deficits. The patient is having a difficult time adjusting to the residual neuropsychological/neurocognitive changes. The patient feels overwhelmed, depressed and "compromised."  Diagnosis:    Traumatic brain injury with loss of consciousness of 1 hour to 5 hours 59 minutes, initial encounter (HCC)  Postconcussive syndrome  Major depressive disorder, single episode, moderate (HCC)         Electronically Signed   _______________________ Arley Phenix, Psy.D.

## 2017-02-22 ENCOUNTER — Encounter: Payer: Managed Care, Other (non HMO) | Attending: Psychology | Admitting: Psychology

## 2017-02-22 DIAGNOSIS — F321 Major depressive disorder, single episode, moderate: Secondary | ICD-10-CM | POA: Diagnosis not present

## 2017-02-22 DIAGNOSIS — S069X3A Unspecified intracranial injury with loss of consciousness of 1 hour to 5 hours 59 minutes, initial encounter: Secondary | ICD-10-CM | POA: Insufficient documentation

## 2017-02-22 DIAGNOSIS — F0781 Postconcussional syndrome: Secondary | ICD-10-CM | POA: Insufficient documentation

## 2017-03-02 ENCOUNTER — Encounter: Payer: Self-pay | Admitting: Psychology

## 2017-03-02 NOTE — Progress Notes (Signed)
Patient:  Annette Kidd   DOB: 04-20-1956  MR Number: [id number]  Location: [facility] FOR PAIN AND [facility] MEDICINE [facility] PHYSICAL MEDICINE AND REHABILITATION [address] [place] [address] [id number] [place] [address] Dept: [phone]  Start: 9 AM End: 10 AM  Provider/Observer:     Hershal Coria PSYD  Chief Complaint:      Chief Complaint  Patient presents with  . Memory Loss  . Headache  . Depression    Reason For Service:     Annette Kidd was referred by Dr. Koleen Distance for psychotherapeutic interventions. The patient had seen Dr. Alinda Dooms for neuropsychological evaluation back in the summer for formal evaluation due to residual effects of her traumatic brain injury. The patient describes an incident where she was walking her dog in January 2018 and a tree limb fell hitting another branch. Apparently, the collision between these 2 treatment of his resulted in the patient getting hit in the face causing her to fall to the ground. Apparently there was a loss of consciousness. The patient has no recall for the events at the time of the specific impact and does not remember events after this until she was being treated in the ICU. Reviewing imaging in her medical records from May 17, 2016 leave an impression of acute left temporal subdural hematoma. They identified a mildly displaced nasal bone fracture as well. There was a nondisplaced fracture involving the left lateral maxillary incisor. Follow-up CT scan conducted on January 16 showed a small decreased size  left tentorial subdural hematoma.  While the patient does report that she feels like she is improved her cognitive function over time there continue to be a number of residual effects that she continues to experience. The patient describes increased and ongoing issues of anxiety and depression around the loss of function, changes in energy level, enthusiasm and lack of motivation, memory  problems, irritability, excessive worrying, low energy, and poor concentration. She feels that the residual effects of this traumatic brain injury are having a significant effect on her life. The patient reports that she "feels compromised periods" she reports that she also continues to have balance and interference issues.  The patient had a neuropsychological evaluation completed in August 2018. I will not go over the totality of these results as they are available in her medical records. However, I have reviewed the raw data presented and would concur that there are indications that the patient had been doing quite well cognitively throughout her life. Many of her hold tests, which are measures that tended be stable over time were often in the superior range of functioning. The most sensitive measures 2 traumatic brain injury and postconcussive syndrome were found to be significantly below the levels that would be predicted on her other neuropsychological test measures. The gated data indicates that there are issues with reasoning and problem-solving, and overall speed of mental operations, and some encoding deficits.  Interventions Strategy:  Cognitive/behavioral another therapeutic interventions around residual neuropsychological effects related to postconcussion syndrome and depressive symptoms.  Participation Level:   Active  Participation Quality:  Appropriate      Behavioral Observation:  Well Groomed, Alert, and Appropriate.   Current Psychosocial Factors: The patient reports that she continues to struggle dealing with her neuro behavioral and emotional disturbance following traumatic brain injury with significant concussive event. The patient continues to experience reduced information processing speed as well as difficulties with encoding and some executive functioning measures.  Content  of Session:   Reviewed current symptoms and worked on therapeutic interventions around adapted to an  adjusting to her current residual neuropsychological deficits.  Current Status:   The patient continues to struggle with reduced information processing speed, difficulties with memory and encoding as well as executive functioning abilities.  Patient Progress:   The patient has seen some improvement since the event occurred in January 2018, however, she continues to have residual neuropsychological deficits and difficulties coping.  Last Reviewed:   02/22/2017  Impression/Diagnosis:   The patient comes in today reporting that she has continued to have negative feelings including depression and anxiety about her recent head trauma/TBI and how these issues may affect her future life. The patient reports that she lacks interest and motivation for past activities. Patient reports that major stressors right now have to do with work and not having the energy or strength to complete basic past. She reports that these symptoms of low energy and low and these he has some also include lack of motivation. The patient reports that she is more irritable and experiences depression and anxiety along with her ongoing cognitive difficulties including memory problems and changes in information processing speed and poor concentration.  Given the fact that the patient showed specific imaging of a subdural hematoma would classify her formal diagnosed as traumatic brain injury although it is likely that the majority of the residual neuropsychological deficits have to do with postconcussive syndrome. There are lingering neuro behavioral changes and emotional disturbance that may not be simply due to difficulties adjusting to the functional changes the patient is experiencing but may also be residual effects of the postconcussion syndrome.  Given the fact that we are now 9 months after the event and she is having the same or worsening symptoms of depression and anxiety and would use a diagnosis of major depressive disorder.  However, he could just as easily be diagnosed as an adjustment disorder with both depression anxiety as the primary stressor is still present even though there is been more than 9 months since onset.  Diagnosis:   Traumatic brain injury with loss of consciousness of 1 hour to 5 hours 59 minutes, initial encounter (HCC)  Postconcussive syndrome  Major depressive disorder, single episode, moderate (HCC)

## 2017-03-19 ENCOUNTER — Encounter: Payer: Managed Care, Other (non HMO) | Attending: Psychology | Admitting: Psychology

## 2017-03-19 DIAGNOSIS — F321 Major depressive disorder, single episode, moderate: Secondary | ICD-10-CM | POA: Diagnosis not present

## 2017-03-19 DIAGNOSIS — F0781 Postconcussional syndrome: Secondary | ICD-10-CM | POA: Insufficient documentation

## 2017-03-19 DIAGNOSIS — S069X3A Unspecified intracranial injury with loss of consciousness of 1 hour to 5 hours 59 minutes, initial encounter: Secondary | ICD-10-CM | POA: Diagnosis not present

## 2017-04-02 ENCOUNTER — Ambulatory Visit: Payer: Managed Care, Other (non HMO) | Admitting: Psychology

## 2017-04-03 ENCOUNTER — Ambulatory Visit: Payer: Managed Care, Other (non HMO) | Admitting: Psychology

## 2017-04-03 ENCOUNTER — Encounter: Payer: Self-pay | Admitting: Psychology

## 2017-04-03 NOTE — Progress Notes (Signed)
Patient:  Annette Kidd   DOB: 07-29-1955  MR Number: [id number]  [facility] [address], [place] [address] [id number] [place] [id number] Dept: [phone]  Start: 2 PM End: 3 PM  Provider/Observer:     Hershal CoriaJohn R Macy Polio PSYD  Chief Complaint:      Chief Complaint  Patient presents with  . Memory Loss  . Depression  . Headache    Reason For Service:     Annette GillesLee P Midkiff was referred by Dr. Koleen DistanceMarybeth Bailar-Heath for psychotherapeutic interventions. The patient had seen Dr. Alinda DoomsBailar for neuropsychological evaluation back in the summer for formal evaluation due to residual effects of her traumatic brain injury. The patient describes an incident where she was walking her dog in January 2018 and a tree limb fell hitting another branch. Apparently, the collision between these 2 treatment of his resulted in the patient getting hit in the face causing her to fall to the ground. Apparently there was a loss of consciousness. The patient has no recall for the events at the time of the specific impact and does not remember events after this until she was being treated in the ICU. Reviewing imaging in her medical records from May 17, 2016 leave an impression of acute left temporal subdural hematoma. They identified a mildly displaced nasal bone fracture as well. There was a nondisplaced fracture involving the left lateral maxillary incisor. Follow-up CT scan conducted on January 16 showed a small decreased size  left tentorial subdural hematoma.  While the patient does report that she feels like she is improved her cognitive function over time there continue to be a number of residual effects that she continues to experience. The patient describes increased and ongoing issues of anxiety and depression around the loss of function, changes in energy level, enthusiasm and lack of motivation, memory  problems, irritability, excessive worrying, low energy, and poor concentration. She feels that the residual effects of this traumatic brain injury are having a significant effect on her life. The patient reports that she "feels compromised periods" she reports that she also continues to have balance and interference issues.  The patient had a neuropsychological evaluation completed in August 2018. I will not go over the totality of these results as they are available in her medical records. However, I have reviewed the raw data presented and would concur that there are indications that the patient had been doing quite well cognitively throughout her life. Many of her hold tests, which are measures that tended be stable over time were often in the superior range of functioning. The most sensitive measures 2 traumatic brain injury and postconcussive syndrome were found to be significantly below the levels that would be predicted on her other neuropsychological test measures. The gated data indicates that there are issues with reasoning and problem-solving, and overall speed of mental operations, and some encoding deficits.  Interventions Strategy:  Cognitive/behavioral another therapeutic interventions around residual neuropsychological effects related to postconcussion syndrome and depressive symptoms.  Participation Level:   Active  Participation Quality:  Appropriate      Behavioral Observation:  Well Groomed, Alert, and Appropriate.   Current Psychosocial Factors: The patient reports that she has been coping better and actively working on the therapeutic interventions we did develop to be applied in day-to-day situations.  Content of Session:   Reviewed current symptoms and worked on therapeutic interventions around adapted to an adjusting to her  current residual neuropsychological deficits.  Current Status:   The patient continues to struggle with reduced information processing speed,  difficulties with memory and encoding as well as executive functioning abilities.  Patient Progress:   The patient has seen some improvement since the event occurred in January 2018, however, she continues to have residual neuropsychological deficits and difficulties coping.  Last Reviewed:   03/19/2017  Impression/Diagnosis:   The patient comes in today reporting that she has continued to have negative feelings including depression and anxiety about her recent head trauma/TBI and how these issues may affect her future life. The patient reports that she lacks interest and motivation for past activities. Patient reports that major stressors right now have to do with work and not having the energy or strength to complete basic past. She reports that these symptoms of low energy and low and these he has some also include lack of motivation. The patient reports that she is more irritable and experiences depression and anxiety along with her ongoing cognitive difficulties including memory problems and changes in information processing speed and poor concentration.  Given the fact that the patient showed specific imaging of a subdural hematoma would classify her formal diagnosed as traumatic brain injury although it is likely that the majority of the residual neuropsychological deficits have to do with postconcussive syndrome. There are lingering neuro behavioral changes and emotional disturbance that may not be simply due to difficulties adjusting to the functional changes the patient is experiencing but may also be residual effects of the postconcussion syndrome.  Given the fact that we are now 9 months after the event and she is having the same or worsening symptoms of depression and anxiety and would use a diagnosis of major depressive disorder. However, he could just as easily be diagnosed as an adjustment disorder with both depression anxiety as the primary stressor is still present even though there is  been more than 9 months since onset.  Diagnosis:   Traumatic brain injury with loss of consciousness of 1 hour to 5 hours 59 minutes, initial encounter (HCC)  Postconcussive syndrome  Major depressive disorder, single episode, moderate (HCC)

## 2017-04-12 ENCOUNTER — Encounter: Payer: Self-pay | Admitting: Sports Medicine

## 2017-04-12 ENCOUNTER — Ambulatory Visit (INDEPENDENT_AMBULATORY_CARE_PROVIDER_SITE_OTHER): Payer: Managed Care, Other (non HMO) | Admitting: Sports Medicine

## 2017-04-12 VITALS — BP 110/70 | Ht 67.0 in | Wt 138.0 lb

## 2017-04-12 DIAGNOSIS — S069X1S Unspecified intracranial injury with loss of consciousness of 30 minutes or less, sequela: Secondary | ICD-10-CM

## 2017-04-12 DIAGNOSIS — M23322 Other meniscus derangements, posterior horn of medial meniscus, left knee: Secondary | ICD-10-CM

## 2017-04-12 NOTE — Patient Instructions (Addendum)
Balance exercises: - Standing on one leg with eye closed for count of 10, Alternate. - Standing on one leg, reaching down with each hand in turn. Alternate.  Mini leg drops to strengthen quads  Wall slides to strength knees and quads  Practice balance in every day life. Wear the knee brace on uneven surfaces. Can use the compression sleeve at other times.  Ice is good for irritation of your knee.  Riding stationary bike is good exercise for knees

## 2017-04-12 NOTE — Assessment & Plan Note (Signed)
While major SXs have resolved she still has residual SXs Balance is poor Confidence in left leg has worsened with no clinical change in degenerative meniscus tear  Cont her walking and exercise program and I think she will still make progress

## 2017-04-12 NOTE — Assessment & Plan Note (Signed)
Previously stable and minimally symptomatic now exacerbated by MSK instability from balance issues secondary to TBI. Knee and quadriceps strengthening exercises as well as balance exercises reviewed with patient. Hinged knee brace for use on uneven surfaces or times that patient may need extra support.

## 2017-04-12 NOTE — Progress Notes (Signed)
    Subjective:  Annette Kidd is a 61 y.o. 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 In January 2018 she suffered a traumatic brain injury with a L sided subdural hematoma and has since been working with several specialists for subsequent issues but has continued to have balance problems. Since worsening of her balance issues she has noticed more L knee pain, particularly on medial side. Worse with twisting motions of her knees. No falls. No weakness.  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  No tenderness to palpation over L patella or joint lines.   L hip strength and knee strength 5/5 .  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 Knee and quadriceps strengthening exercises as well as balance exercises reviewed with patient. Hinged knee brace for use on uneven surfaces or times that patient may need extra support.  Closed Head Injury with subdural - has recovered but still has residual SXs and balance is not back to baseline.  Annette Kidd HerElsia J Amandajo Gonder, DO PGY-2, [facility] 04/12/2017 1:54 PM   I observed and examined the patient with the  resident and agree with assessment and plan.  Note reviewed and modified by me. Enid BaasKarl Fields, MD

## 2017-05-22 ENCOUNTER — Ambulatory Visit: Payer: Managed Care, Other (non HMO) | Admitting: Psychology

## 2017-05-28 ENCOUNTER — Encounter: Payer: Managed Care, Other (non HMO) | Attending: Psychology | Admitting: Psychology

## 2017-05-28 DIAGNOSIS — F321 Major depressive disorder, single episode, moderate: Secondary | ICD-10-CM | POA: Diagnosis not present

## 2017-05-28 DIAGNOSIS — S069X3A Unspecified intracranial injury with loss of consciousness of 1 hour to 5 hours 59 minutes, initial encounter: Secondary | ICD-10-CM | POA: Insufficient documentation

## 2017-05-28 DIAGNOSIS — F0781 Postconcussional syndrome: Secondary | ICD-10-CM

## 2017-06-08 ENCOUNTER — Encounter: Payer: Self-pay | Admitting: Psychology
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
## 2017-07-02 ENCOUNTER — Ambulatory Visit: Payer: Managed Care, Other (non HMO) | Admitting: Psychology

## 2017-07-04 ENCOUNTER — Encounter: Payer: Self-pay | Admitting: Sports Medicine

## 2017-07-04 ENCOUNTER — Ambulatory Visit (INDEPENDENT_AMBULATORY_CARE_PROVIDER_SITE_OTHER): Payer: Managed Care, Other (non HMO) | Admitting: Sports Medicine

## 2017-07-04 DIAGNOSIS — S069X1S Unspecified intracranial injury with loss of consciousness of 30 minutes or less, sequela: Secondary | ICD-10-CM

## 2017-07-04 DIAGNOSIS — M23322 Other meniscus derangements, posterior horn of medial meniscus, left knee: Secondary | ICD-10-CM | POA: Diagnosis not present

## 2017-07-04 NOTE — Assessment & Plan Note (Signed)
Still having some prolonged sxs  Suggest keeping work on balance

## 2017-07-04 NOTE — Progress Notes (Signed)
CC: Left knee pain  Deg meniscus tear 10/17 After trauma and subdural worse balance - Jan 2018 She is doing HEP Goes to gym and rides stationary bike Body Helix really helps Worried about walking up hills Walks well on flat  Past Hx Still with post concussive sxs/ subdural 1/18 Balance is poor since injury  ROS No swelling of left knee No locking No giving way  PE Pleasant older F BP 124/82   Ht 5\' 6"  (1.676 m)   Wt 138 lb (62.6 kg)   BMI 22.27 kg/m   Knee:left Normal to inspection with no erythema or effusion or obvious bony abnormalities. Palpation normal with no warmth or joint line tenderness or patellar tenderness or condyle tenderness. ROM normal in flexion and extension and lower leg rotation. Ligaments with solid consistent endpoints including ACL, PCL, LCL, MCL. Negative Mcmurray's and provocative meniscal tests. Non painful patellar compression. Patellar and quadriceps tendons unremarkable. Hamstring and quadriceps strength is normal.  With all tests normal seems that this has improved Now with good  quad strength  Weakness of left abductor  1 foot balance improved from last visit/ even with closed eyes

## 2017-07-04 NOTE — Assessment & Plan Note (Signed)
This has improved  Pain may be triggered by some hip abduction weakness  Start a hip abduction series Use body helix  OK to start some hiking

## 2017-07-10 ENCOUNTER — Encounter: Payer: Managed Care, Other (non HMO) | Attending: Psychology | Admitting: Psychology

## 2017-07-10 DIAGNOSIS — S069X3A Unspecified intracranial injury with loss of consciousness of 1 hour to 5 hours 59 minutes, initial encounter: Secondary | ICD-10-CM | POA: Diagnosis not present

## 2017-07-10 DIAGNOSIS — F321 Major depressive disorder, single episode, moderate: Secondary | ICD-10-CM | POA: Diagnosis not present

## 2017-07-10 DIAGNOSIS — F0781 Postconcussional syndrome: Secondary | ICD-10-CM | POA: Diagnosis not present

## 2017-07-27 ENCOUNTER — Telehealth: Payer: Self-pay | Admitting: Psychology

## 2017-07-27 NOTE — Telephone Encounter (Signed)
Pt phoned and stated she was having oral surgery and would like your opinion about the type of anesthesia she was to receive for the procedure. Please advise.

## 2017-08-02 ENCOUNTER — Encounter: Payer: Self-pay | Admitting: Psychology
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
## 2017-09-13 ENCOUNTER — Encounter: Payer: Managed Care, Other (non HMO) | Attending: Psychology | Admitting: Psychology

## 2017-09-13 ENCOUNTER — Encounter: Payer: Self-pay | Admitting: Psychology

## 2017-09-13 DIAGNOSIS — S069X3A Unspecified intracranial injury with loss of consciousness of 1 hour to 5 hours 59 minutes, initial encounter: Secondary | ICD-10-CM | POA: Diagnosis not present

## 2017-09-13 DIAGNOSIS — F0781 Postconcussional syndrome: Secondary | ICD-10-CM

## 2017-09-13 DIAGNOSIS — F321 Major depressive disorder, single episode, moderate: Secondary | ICD-10-CM | POA: Insufficient documentation
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
## 2017-10-16 ENCOUNTER — Encounter: Payer: Self-pay | Admitting: Sports Medicine

## 2017-10-16 ENCOUNTER — Ambulatory Visit (INDEPENDENT_AMBULATORY_CARE_PROVIDER_SITE_OTHER): Payer: Managed Care, Other (non HMO) | Admitting: Sports Medicine

## 2017-10-16 VITALS — BP 126/60

## 2017-10-16 DIAGNOSIS — M25562 Pain in left knee: Secondary | ICD-10-CM

## 2017-10-16 DIAGNOSIS — G8929 Other chronic pain: Secondary | ICD-10-CM | POA: Diagnosis not present

## 2017-10-16 NOTE — Progress Notes (Signed)
Chief complaint: Follow-up of chronic left knee pain  History of present illness: Annette Kidd is a 62 year old female presents to sports medicine office today for follow-up of left knee pain.  She was last here in the office about 3-1/2 months ago.  She does have known history of degenerative medial meniscal tear that was diagnosed in October 2017.  Unfortunately, shortly afterward she suffered from traumatic brain injury after being hit very hard by a falling branch while walking her dog.  She was also found to have a small supratentorial dural hematoma.  Since this time, she has been having postconcussive syndrome, vestibular symptoms, as well as issues related to insomnia.  In regards to her left knee, she was found to have weakness of her left hip abductors and she was given home exercise program to work on gluteus medius strengthening as well as hip abductor strengthening.  Today, she does report of interval improvement in symptoms today.  She reports every now and then she will feel a throbbing pain on the medial aspect of her left knee.  She reports and rates the pain as no more than a 2-3.  She does not report any radiation of pain.  She does not report of any painful popping, locking, catching, or symptoms of giving way.  She does not report of any interval injury or trauma.  She does not report of any numbness, tingling, burning paresthesias.  She did go [place] ago.  She reports that the trip did not affect her knees much, she mainly has symptoms related to vestibular ocular symptoms as well as postconcussive symptoms.  She has not had to use any medicines for pain.  She has been using a body helix sleeve which has helped for stability.  She has been very diligent to do her home exercise program.  Review of systems:  As stated above  Interval past medical history, surgical history, family history, and social history obtained and unchanged.  Notable past medical history  notable for postconcussive syndrome after traumatic brain injury and suffering sub-subdural hematoma, history of nasal fracture; surgical history notable for appendectomy; she does not report of any current tobacco use; she does not report a family history of hypertension or type 2 diabetes; allergies and medications are reviewed and are reflected in EMR.  Physical exam: Vital signs are reviewed and are documented in the chart Gen.: Alert, oriented, appears stated age, in no apparent distress HEENT: Moist oral mucosa Respiratory: Normal respirations, able to speak in full sentences Cardiac: Regular rate, distal pulses 2+ Integumentary: No rashes on visible skin:  Neurologic: She does have intact strength with hip flexion, hip abduction, knee flexion, knee extension, ankle dorsiflexion, ankle plantar flexion strength on both sides, sensation 2+ in bilateral lower extremities Psych: Normal affect, mood is described as good Musculoskeletal: Inspection of her left knee reveals no obvious deformity or muscle atrophy, no warmth, erythema, ecchymosis, or effusion, slight tenderness to deep palpation over the left medial joint line, no signs of ligamentous instability is Lachman, anterior drawer, valgus, varus stress testing negative, McMurray negative, she does have full range of motion of her left knee to get a going from 0 degrees to 135 degrees she does not have any pain with single leg stance, hop, or squat  Assessment and plan: 1.  Left knee pain, with ultrasound imaging of degenerative medial meniscal tear, doing well today 2.  Left gluteus medius syndrome, doing well today 3.  History  of traumatic brain injury with postconcussive symptoms and vestibular ocular symptoms status post falling branch hitting her head back in January 2018, currently undergoing vestibular ocular PT and neuropsych treatment   Plan: Discussed with Makiyah today that she has made gains with strength with her left hip abductors  as well as with quads and hamstrings.  She does have questions today related to how often she needs to do therapy.  As long as she keeps up with her home exercise program few times a week, this will do well for her in terms of maintenance.  Discussed the mainly focused on vestibular ocular and neuropsych treatment modalities as it seems that this is the forefront of what is causing her the most distress right now.  Discussed to continue wearing the body helix sleeve.  Discussed usefulness of restful sleep and triggers such as travel and disturbance in sleep patterns.  She will otherwise follow-up here on as-needed basis.    Haynes Kerns, M.D. Primary Care Sports Medicine Fellow [facility] Sports Medicine  I observed and examined the patient with the [facility] Fellow and agree with assessment and plan.  Note reviewed and modified by me. Enid Baas, MD

## 2017-11-15 ENCOUNTER — Encounter: Payer: Self-pay | Admitting: Psychology

## 2017-11-15 ENCOUNTER — Encounter: Payer: Managed Care, Other (non HMO) | Attending: Psychology | Admitting: Psychology

## 2017-11-15 DIAGNOSIS — F321 Major depressive disorder, single episode, moderate: Secondary | ICD-10-CM | POA: Diagnosis not present

## 2017-11-15 DIAGNOSIS — F0781 Postconcussional syndrome: Secondary | ICD-10-CM | POA: Insufficient documentation

## 2017-11-15 DIAGNOSIS — S069X3A Unspecified intracranial injury with loss of consciousness of 1 hour to 5 hours 59 minutes, initial encounter: Secondary | ICD-10-CM

## 2017-11-15 NOTE — Progress Notes (Signed)
Patient:  Annette Kidd   DOB: July 01, 1955  MR Number: [id number]  Location: [facility] FOR PAIN AND [facility] MEDICINE [facility] PHYSICAL MEDICINE AND REHABILITATION [address], [place] [id number] [place] [address] Dept: [phone]  Start: 10 AM End: 11 AM  Provider/Observer:     Hershal Coria PsyD  Chief Complaint:      Chief Complaint  Patient presents with  . Memory Loss  . Headache  . Depression  . Sleeping Problem    Reason For Service:     SHAENA PARKERSON was referred by Dr. Koleen Distance for psychotherapeutic interventions. The patient had seen Dr. Alinda Dooms for neuropsychological evaluation back in the summer for formal evaluation due to residual effects of her traumatic brain injury. The patient describes an incident where she was walking her dog in January 2018 and a tree limb fell hitting another branch. Apparently, the collision between these 2 treatment of his resulted in the patient getting hit in the face causing her to fall to the ground. Apparently there was a loss of consciousness. The patient has no recall for the events at the time of the specific impact and does not remember events after this until she was being treated in the ICU. Reviewing imaging in her medical records from May 17, 2016 leave an impression of acute left temporal subdural hematoma. They identified a mildly displaced nasal bone fracture as well. There was a nondisplaced fracture involving the left lateral maxillary incisor. Follow-up CT scan conducted on January 16 showed a small decreased size  left tentorial subdural hematoma.  While the patient does report that she feels like she is improved her cognitive function over time there continue to be a number of residual effects that she continues to experience. The patient describes increased and ongoing issues of anxiety and depression around the loss of function, changes in energy level, enthusiasm and lack  of motivation, memory problems, irritability, excessive worrying, low energy, and poor concentration. She feels that the residual effects of this traumatic brain injury are having a significant effect on her life. The patient reports that she "feels compromised periods" she reports that she also continues to have balance and interference issues.  The patient had a neuropsychological evaluation completed in August 2018. I will not go over the totality of these results as they are available in her medical records. However, I have reviewed the raw data presented and would concur that there are indications that the patient had been doing quite well cognitively throughout her life. Many of her hold tests, which are measures that tended be stable over time were often in the superior range of functioning. The most sensitive measures 2 traumatic brain injury and postconcussive syndrome were found to be significantly below the levels that would be predicted on her other neuropsychological test measures. The gated data indicates that there are issues with reasoning and problem-solving, and overall speed of mental operations, and some encoding deficits.  The above reason for service has been reviewed and remains pertinent and valid for the current appointment.  Interventions Strategy:  Cognitive/behavioral therapeutic interventions and working on coping skills and strategies to manage residual neuropsychological deficits related to postconcussion syndrome and depressive symptoms.  Participation Level:   Active  Participation Quality:  Appropriate      Behavioral Observation:  Well Groomed, Alert, and Appropriate.   Current Psychosocial Factors: The patient reports that she has continued to struggle with organizing her thoughts and dealing with headaches/fatigue  and doing a limited amount of work that she is doing.  She has had to reduce how many clients she has in the increasingly selective deciding what  projects to take on.  The patient reports that her husband is gone out of town recently and that has stressed her more after her deficits became an issue.  Content of Session:   Reviewed current symptoms and worked on therapeutic strategies and interventions around adapting and adjusting to her current residual neuropsychological deficits  Current Status:   The patient reports that she continues to have information processing speed, memory and encoding deficits as well as executive functioning deficits.  The patient reports that her mood is continue to be better she is taking very cautious approach to any work responsibility and jobs she takes on.  Patient Progress:   The patient continues to show improvement regarding her overall mood but residual neuropsychological deficits continue to be problematic.  Last Reviewed:   11/15/2017  Impression/Diagnosis:   The patient comes in today reporting that she has continued to have negative feelings including depression and anxiety about her recent head trauma/TBI and how these issues may affect her future life. The patient reports that she lacks interest and motivation for past activities. Patient reports that major stressors right now have to do with work and not having the energy or strength to complete basic past. She reports that these symptoms of low energy and low and these he has some also include lack of motivation. The patient reports that she is more irritable and experiences depression and anxiety along with her ongoing cognitive difficulties including memory problems and changes in information processing speed and poor concentration.  Given the fact that the patient showed specific imaging of a subdural hematoma would classify her formal diagnosed as traumatic brain injury although it is likely that the majority of the residual neuropsychological deficits have to do with postconcussive syndrome. There are lingering neuro behavioral changes and  emotional disturbance that may not be simply due to difficulties adjusting to the functional changes the patient is experiencing but may also be residual effects of the postconcussion syndrome.  Given the fact that we are now 9 months after the event and she is having the same or worsening symptoms of depression and anxiety and would use a diagnosis of major depressive disorder. However, he could just as easily be diagnosed as an adjustment disorder with both depression anxiety as the primary stressor is still present even though there is been more than 9 months since onset.  The above impressions and diagnoses remain valid.  They have been reviewed for this appointment.  Diagnosis:   Traumatic brain injury with loss of consciousness of 1 hour to 5 hours 59 minutes, initial encounter (HCC)  Postconcussive syndrome  Major depressive disorder, single episode, moderate (HCC)

## 2018-01-01 ENCOUNTER — Ambulatory Visit (INDEPENDENT_AMBULATORY_CARE_PROVIDER_SITE_OTHER): Payer: Managed Care, Other (non HMO) | Admitting: Sports Medicine

## 2018-01-01 VITALS — BP 122/80 | Ht 66.5 in | Wt 135.0 lb

## 2018-01-01 DIAGNOSIS — M25562 Pain in left knee: Secondary | ICD-10-CM

## 2018-01-01 DIAGNOSIS — G8929 Other chronic pain: Secondary | ICD-10-CM | POA: Diagnosis not present

## 2018-01-02 NOTE — Progress Notes (Signed)
   Subjective:    Patient ID: Annette Kidd, female    DOB: 1955-08-11, 62 y.o.   MRN: [id number]  HPI chief complaint: Left knee pain  Very pleasant 62 year old female who is a well-known patient of Dr. Darrick PennaFields comes in today for reevaluation of left knee pain. Please see the office note from 10/16/2017 for details regarding history regarding her left knee pain. In short, she has a degenerative medial meniscal tear which has been treated conservatively with fairly good results. She has been given home exercises, specifically hip strengthening and quad strengthening exercises which have been helpful. She has also been given a compression sleeve which she finds helpful. Her main complaint is intermittent instability in the knee and occasional sharp medial pain. She denies any significant swelling. Denies locking of the knee. She admits that she has not been doing her exercises quite as diligently as she used to. She denies any new trauma. No numbness or tingling.   Review of Systems    as above Objective:   Physical Exam  Well-developed, well-nourished. No acute distress. Awake alert and oriented 3. Vital signs reviewed  Left hip: Patient has significant weakness with resisted hip abduction as well as a positive Trendelenburg. No tenderness to palpation over the greater trochanter. Smooth painless hip range of motion with a negative logroll.  Left knee: Full range of motion. No obvious effusion.Slight tenderness to palpation along the medial joint line but a negative McMurray's. Her left quad measures 16 inches mid thigh. This is in comparison to her right quadriceps which measures 18 inches mid thigh. She does have moderate quad weakness Good joint stability. She is neurovascularly intact distally. Walking without significant limp.      Assessment & Plan:  Left knee pain secondary to degenerative meniscal tear Left hip weakness  Patient's left quadriceps is 2 inches smaller than the right  quadriceps. I've explained to Annette Kidd the importance of working hard to regain strength in her quadriceps and her hip. I spent time with her going over her exercises and we provided her with a new  isometric quad exercises to start doing. She'll continue with her compression sleeve as well. Follow-up with me or Dr. Darrick PennaFields in 3 months. If symptoms persist or worsen then consider merits of an intra-articular cortisone injection. Patient is encouraged to call with questions or concerns prior to her follow-up visit.

## 2018-01-14 ENCOUNTER — Encounter: Payer: Managed Care, Other (non HMO) | Attending: Psychology | Admitting: Psychology

## 2018-01-14 ENCOUNTER — Encounter: Payer: Self-pay | Admitting: Psychology

## 2018-01-14 DIAGNOSIS — F0781 Postconcussional syndrome: Secondary | ICD-10-CM | POA: Diagnosis not present

## 2018-01-14 DIAGNOSIS — F321 Major depressive disorder, single episode, moderate: Secondary | ICD-10-CM | POA: Insufficient documentation

## 2018-01-14 DIAGNOSIS — S069X3A Unspecified intracranial injury with loss of consciousness of 1 hour to 5 hours 59 minutes, initial encounter: Secondary | ICD-10-CM | POA: Insufficient documentation

## 2018-01-14 NOTE — Progress Notes (Signed)
Patient:  Annette Kidd   DOB: 04-08-1956  MR Number: [id number]  Location: [facility] FOR PAIN AND [facility] MEDICINE [facility] PHYSICAL MEDICINE AND REHABILITATION [address] [place], [address] [id number] [facility] [place] [address] Dept: [phone]  Start: 2 PM End: 3 PM  Provider/Observer:     Hershal Coria PsyD  Chief Complaint:      Chief Complaint  Patient presents with  . Pain  . Memory Loss    Reason For Service:     Annette Kidd was referred by Dr. Koleen Distance for psychotherapeutic interventions. The patient had seen Dr. Alinda Dooms for neuropsychological evaluation back in the summer for formal evaluation due to residual effects of her traumatic brain injury. The patient describes an incident where she was walking her dog in January 2018 and a tree limb fell hitting another branch. Apparently, the collision between these 2 treatment of his resulted in the patient getting hit in the face causing her to fall to the ground. Apparently there was a loss of consciousness. The patient has no recall for the events at the time of the specific impact and does not remember events after this until she was being treated in the ICU. Reviewing imaging in her medical records from May 17, 2016 leave an impression of acute left temporal subdural hematoma. They identified a mildly displaced nasal bone fracture as well. There was a nondisplaced fracture involving the left lateral maxillary incisor. Follow-up CT scan conducted on January 16 showed a small decreased size  left tentorial subdural hematoma.  While the patient does report that she feels like she is improved her cognitive function over time there continue to be a number of residual effects that she continues to experience. The patient describes increased and ongoing issues of anxiety and depression around the loss of function, changes in energy level, enthusiasm and lack of motivation, memory problems,  irritability, excessive worrying, low energy, and poor concentration. She feels that the residual effects of this traumatic brain injury are having a significant effect on her life. The patient reports that she "feels compromised periods" she reports that she also continues to have balance and interference issues.  The patient had a neuropsychological evaluation completed in August 2018. I will not go over the totality of these results as they are available in her medical records. However, I have reviewed the raw data presented and would concur that there are indications that the patient had been doing quite well cognitively throughout her life. Many of her hold tests, which are measures that tended be stable over time were often in the superior range of functioning. The most sensitive measures 2 traumatic brain injury and postconcussive syndrome were found to be significantly below the levels that would be predicted on her other neuropsychological test measures. The gated data indicates that there are issues with reasoning and problem-solving, and overall speed of mental operations, and some encoding deficits.  The above reason for service has been reviewed and remains pertinent and valid for the current appointment.  The patient continues to struggle with pain as well as cognitive difficulties but has been engaging in activities at work on a limited basis.   Interventions Strategy:  Cognitive/behavioral therapeutic interventions and working on coping skills and strategies to manage residual neuropsychological deficits related to postconcussion syndrome  Participation Level:   Active  Participation Quality:  Appropriate      Behavioral Observation:  Well Groomed, Alert, and Appropriate.   Current Psychosocial Factors: The  patient reports that this is the busy season for her job.  She reports it is been difficult for her to manage all of the work even though she is only taking on about half the work  she did previously.  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  Content of Session:   Reviewed current symptoms and continue to work on therapeutic strategies and interventions around adapting and adjusting to her current residual neuropsychological deficits.  Current Status:   The patient reports that information processing speed, memory, and encoding deficits as well as executive functioning deficits continue to linger.  The patient reports that her mood continues to be good but she is continuing to have difficulties operating at the same level and deficiency that she had before her traumatic brain injury.  Patient Progress:   The patient continues to show improvement regarding her overall mood but residual neuropsychological deficits continue to be problematic.  Last Reviewed:   01/14/2018  Impression/Diagnosis:   The patient comes in today reporting that she has continued to have negative feelings including depression and anxiety about her recent head trauma/TBI and how these issues may affect her future life. The patient reports that she lacks interest and motivation for past activities. Patient reports that major stressors right now have to do with work and not having the energy or strength to complete basic past. She reports that these symptoms of low energy and low and these he has some also include lack of motivation. The patient reports that she is more irritable and experiences depression and anxiety along with her ongoing cognitive difficulties including memory problems and changes in information processing speed and poor concentration.  Given the fact that the patient showed specific imaging of a subdural hematoma would classify her formal diagnosed as traumatic brain injury although it is likely that the majority of the residual neuropsychological deficits have  to do with postconcussive syndrome. There are lingering neuro behavioral changes and emotional disturbance that may not be simply due to difficulties adjusting to the functional changes the patient is experiencing but may also be residual effects of the postconcussion syndrome.  Given the fact that we are now 9 months after the event and she is having the same or worsening symptoms of depression and anxiety and would use a diagnosis of major depressive disorder. However, he could just as easily be diagnosed as an adjustment disorder with both depression anxiety as the primary stressor is still present even though there is been more than 9 months since onset.  The above impressions and diagnoses remain valid.  They have been reviewed for this appointment.  Diagnosis:   Traumatic brain injury with loss of consciousness of 1 hour to 5 hours 59 minutes, initial encounter (HCC)  Postconcussive syndrome  Major depressive disorder, single episode, moderate (HCC)

## 2018-02-25 ENCOUNTER — Encounter: Payer: Managed Care, Other (non HMO) | Attending: Psychology | Admitting: Psychology

## 2018-02-25 DIAGNOSIS — S069X3A Unspecified intracranial injury with loss of consciousness of 1 hour to 5 hours 59 minutes, initial encounter: Secondary | ICD-10-CM | POA: Diagnosis present

## 2018-02-25 DIAGNOSIS — F0781 Postconcussional syndrome: Secondary | ICD-10-CM | POA: Diagnosis not present

## 2018-02-25 DIAGNOSIS — F321 Major depressive disorder, single episode, moderate: Secondary | ICD-10-CM | POA: Insufficient documentation

## 2018-02-25 NOTE — Progress Notes (Signed)
Patient:  Annette Kidd   DOB: 1955-09-12  MR Number: [id number]  Location: [facility] FOR PAIN AND [facility] MEDICINE [facility] PHYSICAL MEDICINE AND REHABILITATION [address] [place], [address] [id number] [facility] [place] [address] Dept: [phone]  Start: 2 PM End: 3 PM  Provider/Observer:     Hershal Coria PsyD  Chief Complaint:      Chief Complaint  Patient presents with  . Pain  . Memory Loss  . Headache  . Sleeping Problem    Reason For Service:     Annette Kidd was referred by Dr. Koleen Distance for psychotherapeutic interventions. The patient had seen Dr. Alinda Dooms for neuropsychological evaluation back in the summer for formal evaluation due to residual effects of her traumatic brain injury. The patient describes an incident where she was walking her dog in January 2018 and a tree limb fell hitting another branch. Apparently, the collision between these 2 treatment of his resulted in the patient getting hit in the face causing her to fall to the ground. Apparently there was a loss of consciousness. The patient has no recall for the events at the time of the specific impact and does not remember events after this until she was being treated in the ICU. Reviewing imaging in her medical records from May 17, 2016 leave an impression of acute left temporal subdural hematoma. They identified a mildly displaced nasal bone fracture as well. There was a nondisplaced fracture involving the left lateral maxillary incisor. Follow-up CT scan conducted on January 16 showed a small decreased size  left tentorial subdural hematoma.  While the patient does report that she feels like she is improved her cognitive function over time there continue to be a number of residual effects that she continues to experience. The patient describes increased and ongoing issues of anxiety and depression around the loss of function, changes in energy level, enthusiasm and lack of  motivation, memory problems, irritability, excessive worrying, low energy, and poor concentration. She feels that the residual effects of this traumatic brain injury are having a significant effect on her life. The patient reports that she "feels compromised periods" she reports that she also continues to have balance and interference issues.  The patient had a neuropsychological evaluation completed in August 2018. I will not go over the totality of these results as they are available in her medical records. However, I have reviewed the raw data presented and would concur that there are indications that the patient had been doing quite well cognitively throughout her life. Many of her hold tests, which are measures that tended be stable over time were often in the superior range of functioning. The most sensitive measures 2 traumatic brain injury and postconcussive syndrome were found to be significantly below the levels that would be predicted on her other neuropsychological test measures. The gated data indicates that there are issues with reasoning and problem-solving, and overall speed of mental operations, and some encoding deficits.  The above reason for service has been reviewed and remains pertinent for the current appointment.  The patient reports that she is continuing to have some struggles with cognitive functioning and also having issues with pain going forward.  The patient is looking at having surgery on her nose and has been told that it can be up to a year recovery after the surgery.  The patient is somewhat stressed about this and worried about the extended downtime.   Interventions Strategy:  Cognitive/behavioral psychotherapeutic interventions and working  on coping skills and strategies to manage residual neuropsychological deficits related to postconcussion syndrome.  Participation Level:   Active  Participation Quality:  Appropriate      Behavioral Observation:  Well Groomed,  Alert, and Appropriate.   Current Psychosocial Factors: The patient reports that she has struggled to manage this relatively active time with her profession.  She reports that she has had to turn down a lot of job opportunities and request for projects.  The patient reports that she is keeping it is simple as she can to try to keep from being overwhelmed with responsibilities.  Content of Session:   Reviewed current symptoms and continue to work on therapeutic strategies and interventions around adapting and adjusting to his her current residual neuropsychological deficits.  Current Status:   The patient reports that she has been having more sinus difficulties and is looking at having surgery.  She reports that her processing speed and memory have improved from where they were at the beginning after her initial injury.  However, she continues to experience residual neuropsychological deficits and has struggled with her loss of functioning.  Patient Progress:   The patient has improved her reports that she is reporting that she has had improvements from where she was initially after her traumatic brain injury.  However, the patient reports that she continues to have more difficulties than she did with processing and memory.  The patient continues to show improvement regarding her overall mood but residual neuropsychological deficits continue to be problematic.  Last Reviewed:   02/25/2018  Impression/Diagnosis:   The patient comes in today reporting that she has continued to have negative feelings including depression and anxiety about her recent head trauma/TBI and how these issues may affect her future life. The patient reports that she lacks interest and motivation for past activities. Patient reports that major stressors right now have to do with work and not having the energy or strength to complete basic past. She reports that these symptoms of low energy and low and these he has some also include  lack of motivation. The patient reports that she is more irritable and experiences depression and anxiety along with her ongoing cognitive difficulties including memory problems and changes in information processing speed and poor concentration.  Given the fact that the patient showed specific imaging of a subdural hematoma would classify her formal diagnosed as traumatic brain injury although it is likely that the majority of the residual neuropsychological deficits have to do with postconcussive syndrome. There are lingering neuro behavioral changes and emotional disturbance that may not be simply due to difficulties adjusting to the functional changes the patient is experiencing but may also be residual effects of the postconcussion syndrome.  Given the fact that we are now 9 months after the event and she is having the same or worsening symptoms of depression and anxiety and would use a diagnosis of major depressive disorder. However, he could just as easily be diagnosed as an adjustment disorder with both depression anxiety as the primary stressor is still present even though there is been more than 9 months since onset.   The above impressions and diagnoses remain valid.  They have been reviewed for this appointment.  The patient is continuing to have difficulties with information processing speed and memory although there have been improvements over the past several months.  The patient continues to have a lot of pain and frustration with not being able to be as active for as efficient with  her job as she was before her concussion.  Diagnosis:   Postconcussive syndrome  Major depressive disorder, single episode, moderate (HCC)

## 2018-03-16 IMAGING — CT CT CERVICAL SPINE W/O CM
2 of 11 series · 7 of 34 positions shown, 8 images · non-contrast
Comparison: None.

CLINICAL DATA: Fallen tree branch hit face.

EXAM:
CT HEAD WITHOUT CONTRAST
CT MAXILLOFACIAL WITHOUT CONTRAST
CT CERVICAL SPINE WITHOUT CONTRAST
TECHNIQUE: Multidetector CT imaging of the head, cervical spine, and
maxillofacial structures were performed using the standard protocol
without intravenous contrast. Multiplanar CT image reconstructions
of the cervical spine and maxillofacial structures were also
generated.

[Series 9: c_spine 2.0 st · axial · 0.30mm/px · z∈[-279,-89]mm · 3 of 96 slices shown, 4 images]
[im 1/96  soft-tissue]
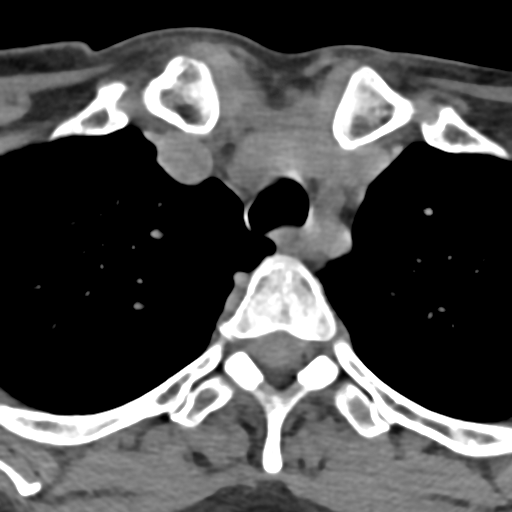
[im 1/96  bone]
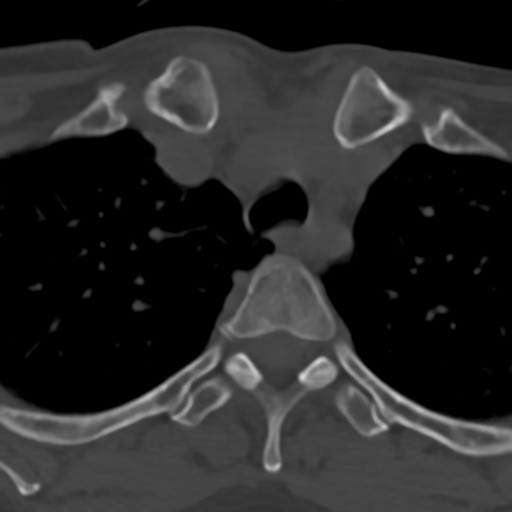
[im 48/96  bone]
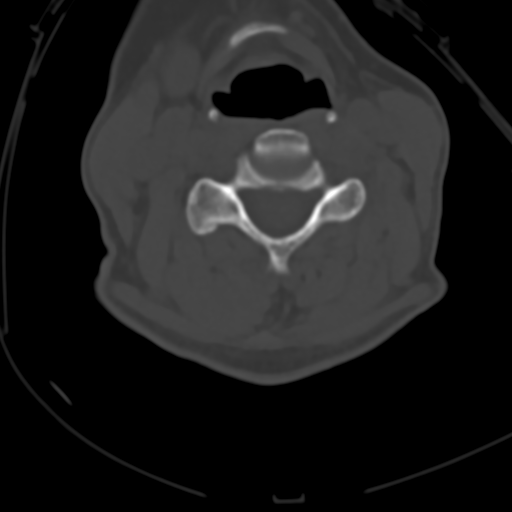
[im 96/96  bone]
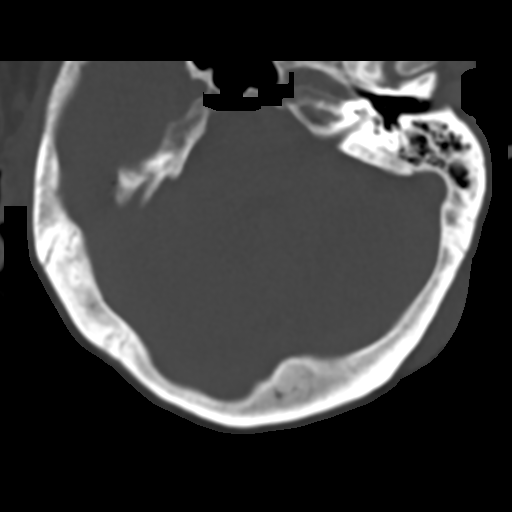

[Series 14: facialbone 2.0 sag st · sagittal · 0.32mm/px · 4 of 81 slices shown]
[im 16/81  soft-tissue]
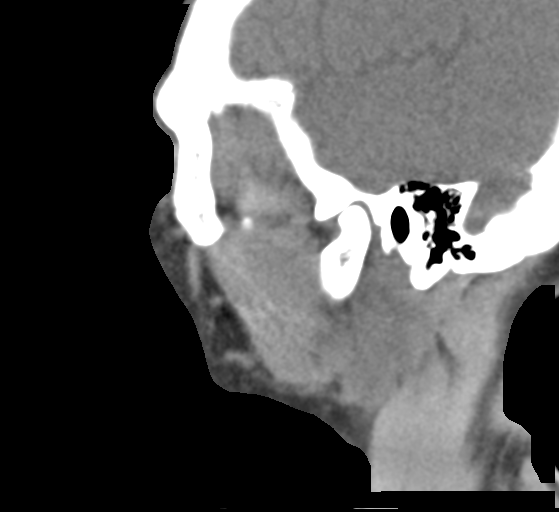
[im 21/81  bone]
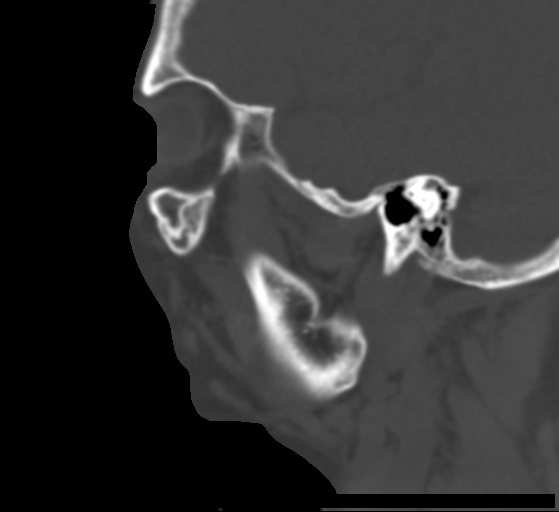
[im 41/81  bone]
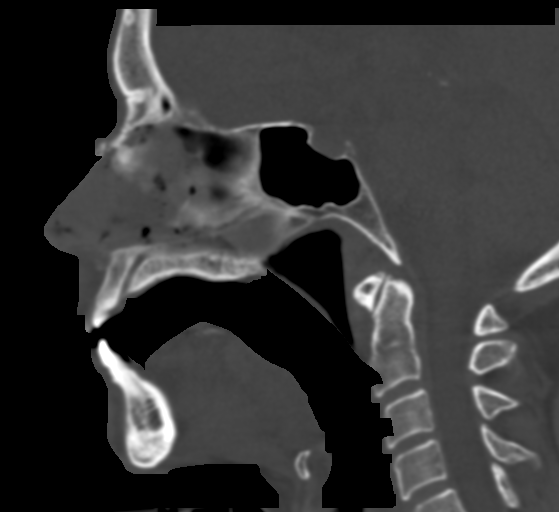
[im 61/81  bone]
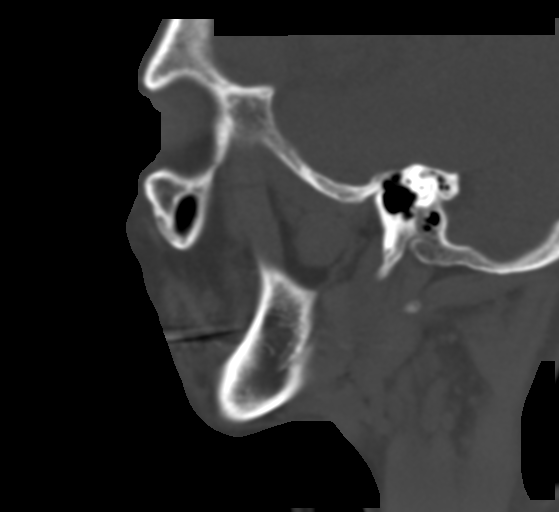

[7 of 34 positions shown; findings below may reference images not displayed]

FINDINGS: CT HEAD FINDINGS

Brain: Left tentorial subdural hematoma is noted. No mass effect or
midline shift is noted. Ventricular size is within normal limits.
There is no evidence of mass lesion or acute infarction.

Vascular: No hyperdense vessel or unexpected calcification.

Skull: Normal. Negative for fracture or focal lesion.

Other: Small left occipital scalp hematoma is noted.

CT MAXILLOFACIAL FINDINGS

Osseous: Comminuted and mildly displaced fracture is seen involving
the nasal bones. Nondisplaced fracture involving left lateral
incisor of maxilla is noted.

Orbits: Negative. No traumatic or inflammatory finding.

Sinuses: Clear.

Soft tissues: Negative.

CT CERVICAL SPINE FINDINGS

Alignment: Normal.

Skull base and vertebrae: No acute fracture. No primary bone lesion
or focal pathologic process.

Soft tissues and spinal canal: No prevertebral fluid or swelling. No
visible canal hematoma.

Disc levels: Mild degenerative disc disease is noted at C5-6 with
posterior osteophyte formation. Moderate degenerative disc disease
is noted at C6-7.

Upper chest: Negative.

Other: None.
IMPRESSION: Acute left tentorial subdural hematoma is noted.

Comminuted and mildly displaced nasal bone fracture. Also noted is
nondisplaced fracture involving the left lateral maxillary incisor.

Degenerative changes are noted in the cervical spine. No acute
abnormality seen.

Critical Value/emergent results were called by telephone at the time
of interpretation on 05/17/2016 at [DATE] to Dr. NONGAZI KLUYTS , who
verbally acknowledged these results.

## 2018-03-22 ENCOUNTER — Other Ambulatory Visit: Payer: Self-pay | Admitting: Family Medicine

## 2018-03-22 DIAGNOSIS — Z1231 Encounter for screening mammogram for malignant neoplasm of breast: Secondary | ICD-10-CM

## 2018-04-01 ENCOUNTER — Ambulatory Visit
Admission: RE | Admit: 2018-04-01 | Discharge: 2018-04-01 | Disposition: A | Discharge status: Home / Self Care | Payer: Managed Care, Other (non HMO) | Source: Ambulatory Visit | Attending: Family Medicine | Admitting: Family Medicine

## 2018-04-01 DIAGNOSIS — Z1231 Encounter for screening mammogram for malignant neoplasm of breast: Secondary | ICD-10-CM

## 2018-04-09 ENCOUNTER — Ambulatory Visit (INDEPENDENT_AMBULATORY_CARE_PROVIDER_SITE_OTHER): Payer: Managed Care, Other (non HMO) | Admitting: Sports Medicine

## 2018-04-09 VITALS — BP 126/74 | Ht 66.5 in | Wt 135.0 lb

## 2018-04-09 DIAGNOSIS — M23322 Other meniscus derangements, posterior horn of medial meniscus, left knee: Secondary | ICD-10-CM

## 2018-04-09 NOTE — Assessment & Plan Note (Signed)
Patient still improving steadily.  Physical exam indicates increased stability of left leg past the level of right.  Continue prior hip and knee exercises, will add lunging step ups and continue to focus on one leg at squats with goal of 30 degrees flexion on each side.

## 2018-04-09 NOTE — Progress Notes (Signed)
   HPI Patient is a 62 year old female with relevant history of left meniscus tear and TBI with some improving but residual balance complaints.  She has been seen multiple times for knee rehab, and says that she has been doing her exercises as prescribed.  She would like to discuss expected timeframe and reasonable hope for full recovery.  She feels that pain has been reasonably well controlled and improving as has her ability to perform exercises.  Particular doing well with wall sits, one leg at squats still with difficulty going to 30 degree flexion on the left but no distal complaints.  CC: Left meniscal degenerative tear   Medications/Interventions Tried: Rehabilitation exercises  See HPI and/or previous note for associated ROS.  Objective: BP 126/74   Ht 5' 6.5" (1.689 m)   Wt 135 lb (61.2 kg)   BMI 21.46 kg/m  Gen: NAD, well groomed, a/o x3, normal affect. thin CV: Well-perfused. Warm.  Resp: Non-labored.  Neuro: Sensation intact throughout. No gross coordination deficits on exam despite complaints of intermittent balance issues that seem most present when she changes direction quickly.  Gait: Nonpathologic posture, unremarkable stride without signs of limp or balance issues. KNEE exam: Able to perform sliding knee sits to 90 degrees with no difficulty.  Right one leg at squat to 30 degrees with no difficulty, left one leg and squat to approximately 20 degrees.   Right leg stepdown from stool with stable descent but wobbly knee.  Left leg stepdown with more concentration but stable knee.  Good flexion and extension strength bilaterally.   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  Physical exam indicates increased stability of left leg past the level of right.  Continue prior hip and knee exercises, will add lunging step ups and continue to focus on one leg at squats with goal of 30 degrees flexion on each  side.   No orders of the defined types were placed in this encounter.   No orders of the defined types were placed in this encounter.   Annette RollingScott Bland, DO  PGY2 [place] 04/09/2018 10:39 PM   I observed and examined the patient with the resident and agree with assessment and plan.  Note reviewed and modified by me. Annette BaasKarl Zyona Pettaway, MD

## 2018-04-12 ENCOUNTER — Encounter: Payer: Self-pay | Admitting: Sports Medicine

## 2018-05-14 ENCOUNTER — Ambulatory Visit: Payer: Managed Care, Other (non HMO)

## 2018-05-27 ENCOUNTER — Encounter: Payer: Managed Care, Other (non HMO) | Attending: Psychology | Admitting: Psychology

## 2018-05-27 DIAGNOSIS — F321 Major depressive disorder, single episode, moderate: Secondary | ICD-10-CM

## 2018-05-27 DIAGNOSIS — F0781 Postconcussional syndrome: Secondary | ICD-10-CM | POA: Diagnosis not present

## 2018-05-27 DIAGNOSIS — S069X3A Unspecified intracranial injury with loss of consciousness of 1 hour to 5 hours 59 minutes, initial encounter: Secondary | ICD-10-CM | POA: Diagnosis present

## 2018-06-06 ENCOUNTER — Encounter: Payer: Self-pay | Admitting: Psychology

## 2018-06-06 NOTE — Progress Notes (Signed)
Patient:  Annette Kidd   DOB: Nov 14, 1955  MR Number: [id number]  Location: [facility] FOR PAIN AND REHABILITATIVE MEDICINE [facility] PHYSICAL MEDICINE AND REHABILITATION [address] [place], [address] [id number] [facility] [place] [address] Dept: [phone]  Start: 4 PM End: 5 PM  Provider/Observer:     Hershal Coria PsyD  Chief Complaint:      Chief Complaint  Patient presents with  . Headache  . Memory Loss  . Pain    Reason For Service:     Annette Kidd was referred by Dr. Koleen Distance for psychotherapeutic interventions. The patient had seen Dr. Alinda Dooms for neuropsychological evaluation back in the summer for formal evaluation due to residual effects of her traumatic brain injury. The patient describes an incident where she was walking her dog in January 2018 and a tree limb fell hitting another branch. Apparently, the collision between these 2 treatment of his resulted in the patient getting hit in the face causing her to fall to the ground. Apparently there was a loss of consciousness. The patient has no recall for the events at the time of the specific impact and does not remember events after this until she was being treated in the ICU. Reviewing imaging in her medical records from May 17, 2016 leave an impression of acute left temporal subdural hematoma. They identified a mildly displaced nasal bone fracture as well. There was a nondisplaced fracture involving the left lateral maxillary incisor. Follow-up CT scan conducted on January 16 showed a small decreased size  left tentorial subdural hematoma.  While the patient does report that she feels like she is improved her cognitive function over time there continue to be a number of residual effects that she continues to experience. The patient describes increased and ongoing issues of anxiety and depression around the loss of function, changes in energy level, enthusiasm and lack of motivation, memory  problems, irritability, excessive worrying, low energy, and poor concentration. She feels that the residual effects of this traumatic brain injury are having a significant effect on her life. The patient reports that she "feels compromised periods" she reports that she also continues to have balance and interference issues.  The patient had a neuropsychological evaluation completed in August 2018. I will not go over the totality of these results as they are available in her medical records. However, I have reviewed the raw data presented and would concur that there are indications that the patient had been doing quite well cognitively throughout her life. Many of her hold tests, which are measures that tended be stable over time were often in the superior range of functioning. The most sensitive measures 2 traumatic brain injury and postconcussive syndrome were found to be significantly below the levels that would be predicted on her other neuropsychological test measures. The gated data indicates that there are issues with reasoning and problem-solving, and overall speed of mental operations, and some encoding deficits.  The above reason for service has been reviewed and remains pertinent for the current appointment.  The patient reports that she is continuing to struggle with work issues and being able to do the amount of work that she feels like she needs to.  The patient is struggling with her loss of function and is having a significant negative effect on her emotionally.  The patient reports that she is still trying to figure out about further nasal surgery and how low impact her ability in the extended downtime will create.  Interventions Strategy:  Cognitive/behavioral psychotherapeutic interventions and working on coping skills and strategies to manage residual effects of postconcussion syndrome.  Participation Level:   Active  Participation Quality:  Appropriate      Behavioral  Observation:  Well Groomed, Alert, and Appropriate.   Current Psychosocial Factors: The patient reports that she has struggled to manage this relatively active time with her profession.  She reports that she has had to turn down a lot of job opportunities and request for projects.  The patient reports that she is keeping it is simple as she can to try to keep from being overwhelmed with responsibilities.  Content of Session:   Reviewed current symptoms and continue to work on therapeutic strategies and interventions around adapting and adjusting to his her current residual neuropsychological deficits.  Current Status:   The patient reports that she has been having more sinus difficulties and is looking at having surgery.  She reports that her processing speed and memory have improved from where they were at the beginning after her initial injury.  However, she continues to experience residual neuropsychological deficits and has struggled with her loss of functioning.  Patient Progress:   The patient has improved her reports that she is reporting that she has had improvements from where she was initially after her traumatic brain injury.  However, the patient reports that she continues to have more difficulties than she did with processing and memory.  The patient continues to show improvement regarding her overall mood but residual neuropsychological deficits continue to be problematic.  Last Reviewed:   05/27/2017  Impression/Diagnosis:   The patient comes in today reporting that she has continued to have negative feelings including depression and anxiety about her recent head trauma/TBI and how these issues may affect her future life. The patient reports that she lacks interest and motivation for past activities. Patient reports that major stressors right now have to do with work and not having the energy or strength to complete basic past. She reports that these symptoms of low energy and low and these  he has some also include lack of motivation. The patient reports that she is more irritable and experiences depression and anxiety along with her ongoing cognitive difficulties including memory problems and changes in information processing speed and poor concentration.  Given the fact that the patient showed specific imaging of a subdural hematoma would classify her formal diagnosed as traumatic brain injury although it is likely that the majority of the residual neuropsychological deficits have to do with postconcussive syndrome. There are lingering neuro behavioral changes and emotional disturbance that may not be simply due to difficulties adjusting to the functional changes the patient is experiencing but may also be residual effects of the postconcussion syndrome.  Given the fact that we are now 9 months after the event and she is having the same or worsening symptoms of depression and anxiety and would use a diagnosis of major depressive disorder. However, he could just as easily be diagnosed as an adjustment disorder with both depression anxiety as the primary stressor is still present even though there is been more than 9 months since onset.  The above reason for service has been reviewed and remains pertinent for the current appointment.  The patient reports that she is continuing to struggle with work issues and being able to do the amount of work that she feels like she needs to.  The patient is struggling with her loss of function and is having a  significant negative effect on her emotionally.  The patient reports that she is still trying to figure out about further nasal surgery and how low impact her ability in the extended downtime will create.   Diagnosis:   Postconcussive syndrome  Major depressive disorder, single episode, moderate (HCC)  Traumatic brain injury with loss of consciousness of 1 hour to 5 hours 59 minutes, initial encounter (HCC)

## 2018-06-25 ENCOUNTER — Ambulatory Visit (INDEPENDENT_AMBULATORY_CARE_PROVIDER_SITE_OTHER): Payer: Managed Care, Other (non HMO) | Admitting: Sports Medicine

## 2018-06-25 DIAGNOSIS — M23322 Other meniscus derangements, posterior horn of medial meniscus, left knee: Secondary | ICD-10-CM

## 2018-06-25 NOTE — Assessment & Plan Note (Signed)
The knee exam is good today Still some mild medial pain but only on provocative tests Able to do 1 leg mini knee bend and lunge  I suggested continuing a HEP at least 3 days per week No new injury from steps  Cautioned about skiing as balance issues persist May want to snoe shoe instead

## 2018-06-25 NOTE — Progress Notes (Signed)
CC: Left knee pain  Patient noted with degenerative tear of medial meniscus in August 2019 Had made good progress with HEP by visit in Dec. Doing well until did lots of carrying boxes up and down steps 6 weeks ago. Hurt to do exercises Rested for past 3 weeks and knee feels better Comes for check as she is going on a trip to ski  ROS No locking of knee No giving way Still has balance issues at times  PE Pleasant older F in NAD BP 100/68   Ht 5\' 5"  (1.651 m)   Wt 132 lb (59.9 kg)   BMI 21.97 kg/m   Knee:Left Normal to inspection with no erythema or effusion or obvious bony abnormalities. Palpation normal with no warmth or joint line tenderness or patellar tenderness or condyle tenderness. ROM normal in flexion and extension and lower leg rotation. Ligaments with solid consistent endpoints including ACL, PCL, LCL, MCL.  Mcmurray's and provocative meniscal tests cause mild pain along medial joint line Non painful patellar compression. Patellar and quadriceps tendons unremarkable. Hamstring and quadriceps strength is normal.

## 2018-06-26 ENCOUNTER — Other Ambulatory Visit: Payer: Self-pay | Admitting: Family Medicine

## 2018-06-26 DIAGNOSIS — M8580 Other specified disorders of bone density and structure, unspecified site: Secondary | ICD-10-CM

## 2018-08-22 ENCOUNTER — Other Ambulatory Visit: Payer: Managed Care, Other (non HMO)

## 2018-11-26 ENCOUNTER — Other Ambulatory Visit: Payer: Managed Care, Other (non HMO)

## 2019-02-03 ENCOUNTER — Other Ambulatory Visit: Payer: Self-pay

## 2019-02-03 ENCOUNTER — Ambulatory Visit
Admission: RE | Admit: 2019-02-03 | Discharge: 2019-02-03 | Disposition: A | Discharge status: Home / Self Care | Payer: Managed Care, Other (non HMO) | Source: Ambulatory Visit | Attending: Family Medicine | Admitting: Family Medicine

## 2019-02-03 DIAGNOSIS — M8580 Other specified disorders of bone density and structure, unspecified site: Secondary | ICD-10-CM

## 2019-03-21 ENCOUNTER — Telehealth: Payer: Self-pay | Admitting: Clinical

## 2019-03-21 NOTE — Telephone Encounter (Signed)
Pt LVM requesting to schedule appt with Myra Gianotti, LCSW at [facility].  I called pt & provided contact info to schedule w [phone]

## 2019-04-01 ENCOUNTER — Other Ambulatory Visit: Payer: Self-pay

## 2019-04-01 DIAGNOSIS — Z20822 Contact with and (suspected) exposure to covid-19: Secondary | ICD-10-CM

## 2019-04-03 LAB — NOVEL CORONAVIRUS, NAA: SARS-CoV-2, NAA: NOT DETECTED

## 2019-06-12 ENCOUNTER — Other Ambulatory Visit: Payer: Self-pay

## 2019-06-12 ENCOUNTER — Ambulatory Visit (INDEPENDENT_AMBULATORY_CARE_PROVIDER_SITE_OTHER): Payer: Managed Care, Other (non HMO) | Admitting: Sports Medicine

## 2019-06-12 DIAGNOSIS — S76012A Strain of muscle, fascia and tendon of left hip, initial encounter: Secondary | ICD-10-CM

## 2019-06-12 DIAGNOSIS — S76019A Strain of muscle, fascia and tendon of unspecified hip, initial encounter: Secondary | ICD-10-CM | POA: Insufficient documentation

## 2019-06-12 DIAGNOSIS — M23322 Other meniscus derangements, posterior horn of medial meniscus, left knee: Secondary | ICD-10-CM | POA: Diagnosis not present

## 2019-06-12 NOTE — Assessment & Plan Note (Signed)
Stable. Advised to continue strengthening exercises at least three times a week. HEP reviewed with patient and exercise sheet given to patient.

## 2019-06-12 NOTE — Assessment & Plan Note (Signed)
Patient with gluteus medius syndrome. Limited abduction strength. Advised HEP including abduction exercises, mini knee bends, and stepdowns. Advised to avoid doing lots of jumping exercises. Follow up if no improvement.

## 2019-06-12 NOTE — Progress Notes (Signed)
   Annette Kidd is a 64 y.o. female who presents to [facility] today for the following:  Left knee and hip pain Patient presenting for follow up of left knee pain. States that she recently has had worsening pain that now causes left hip pain. States she feels it all day even at night. It is in the front and back of her hip. Is very active. Does fitness blender online classes that involve lots of jumping which is when she thinks she exacerbated the issue. Was previously doing home PT for strengthening of the quadriceps but stopped this. Does daily yoga. Is walking daily. Has been taking tumeric tid which helps some. Also using ibuprofen which helps somewhat.   Degenerative mensicus medial left leg in 2018 and 2019 resolved with HEP Had gluteus medius syndrome in 2016 which also improved after HEP Did stop her rehab exercises with more yoga and tai chi   PMH reviewed. TBI, degenerative tear of medial meniscus, length length inequality ROS as above and: No locking No swelling No giving way of left knee  Medications reviewed.  Exam:  BP 114/78   Ht 5' 6" (1.676 m)   BMI 21.31 kg/m  Gen: Well NAD MSK:  Knee: - Inspection: no gross deformity. No swelling/effusion, erythema or bruising. Skin intact - Palpation: no TTP - ROM: full active ROM with flexion and extension in knee and hip - Strength: 5/5 strength - Neuro/vasc: NV intact - Special Tests: -- MENISCUS: negative McMurray's  Hip:  - Inspection: No gross deformity, no swelling, erythema, or ecchymosis - Palpation: No TTP, specifically none over greater trochanter - ROM: Normal range of motion on Flexion, extension, internal and external rotation, decreased abduction strength only on the left - Strength: Normal strength. Decreased abduction strength  - Neuro/vasc: NV intact distally - Special Tests: Negative FABER and FADIR.   Step down with some increased medial lateral motion > on left Walking gait shows slight hesitation on left  but no trendelenburg   Assessment and Plan: 1) Degenerative tear of posterior horn of medial meniscus Stable. Advised to continue strengthening exercises at least three times a week. HEP reviewed with patient and exercise sheet given to patient.   Strain of gluteus medius Patient with gluteus medius syndrome. Limited abduction strength. Advised HEP including abduction exercises, mini knee bends, and stepdowns. Advised to avoid doing lots of jumping exercises. Follow up if no improvement.     Dalphine Handing, PGY-3 [facility] 06/12/2019 4:27 PM  I observed and examined the patient with the resident and agree with assessment and plan.  Note reviewed and modified by me.  I spent 34 minutes with this patient. Over 50% of visit was spent in counseling and coordination of care for problems with knee and hip pain.  Ila Mcgill, MD

## 2019-06-26 ENCOUNTER — Ambulatory Visit: Payer: Managed Care, Other (non HMO) | Admitting: Sports Medicine

## 2020-09-20 DIAGNOSIS — R69 Illness, unspecified: Secondary | ICD-10-CM | POA: Diagnosis not present

## 2020-09-30 DIAGNOSIS — R69 Illness, unspecified: Secondary | ICD-10-CM | POA: Diagnosis not present

## 2020-11-03 DIAGNOSIS — R69 Illness, unspecified: Secondary | ICD-10-CM | POA: Diagnosis not present

## 2020-11-16 DIAGNOSIS — R69 Illness, unspecified: Secondary | ICD-10-CM | POA: Diagnosis not present

## 2020-12-02 ENCOUNTER — Ambulatory Visit: Payer: Self-pay | Admitting: Sports Medicine

## 2020-12-14 DIAGNOSIS — R69 Illness, unspecified: Secondary | ICD-10-CM | POA: Diagnosis not present

## 2020-12-16 ENCOUNTER — Ambulatory Visit: Payer: Medicare HMO | Admitting: Sports Medicine

## 2020-12-16 ENCOUNTER — Other Ambulatory Visit: Payer: Self-pay

## 2020-12-16 VITALS — BP 122/78 | Ht 65.5 in | Wt 132.0 lb

## 2020-12-16 DIAGNOSIS — M2170 Unequal limb length (acquired), unspecified site: Secondary | ICD-10-CM

## 2020-12-16 DIAGNOSIS — M23322 Other meniscus derangements, posterior horn of medial meniscus, left knee: Secondary | ICD-10-CM

## 2020-12-16 NOTE — Assessment & Plan Note (Signed)
Two new sets of sports insoles made for patient to use while in [place].  One set has left heel wedge, right 5th ray post, and bilateral small scaphoid pads to use in her Birkenstock Helene Shoe.  Another pair made identical to her previous pair with left heel wedge, right 5th ray post.  She ambulated well with both pairs and finds them comfortable. Follow up as needed.

## 2020-12-16 NOTE — Assessment & Plan Note (Signed)
She has excellent ROM and strength on examination and is very functional.  Overall doing well with HEP.  Provided reassurance and continue to encourage exercise and HEP.  Compression sleeve encouraged while hiking.

## 2020-12-16 NOTE — Progress Notes (Signed)
   Annette Kidd is a 65 y.o. female who presents to [facility] today for the following:  Orthotic F/u Has had multiple prior pairs of sports insoles with left heel wedge for anatomic short leg and right 5th ray post She is very happy with her insoles, but has had them for many years and would like replacements, especially as she is going to [place] soon and will be hiking and walking a lot  Left Knee Pain She has a known degenerative tear of medial meniscus, for which she has been seen here for multiple tomes, that does not limit her during most activities A few stretches during yoga cause it to pull however she does not have significant pain with this Overall she is doing well, but would like her knee to be examined to ensure that it looks okay   PMH reviewed.  ROS as above. Medications reviewed.  Exam:  BP 122/78   Ht 5' 5.5" (1.664 m)   Wt 132 lb (59.9 kg)   BMI 21.63 kg/m  Gen: Well NAD MSK:  Left Knee: - Inspection: no gross deformity b/l. No swelling/effusion, erythema or bruising b/l. Skin intact - Palpation: mild TTP posterior medial joint line on left - ROM: full active ROM with flexion and extension in knee and hip b/l - Strength: 5/5 strength b/l - Neuro/vasc: NV intact distally b/l - Special Tests: - LIGAMENTS: negative anterior and posterior drawer, negative Lachman's, no MCL or LCL laxity  -- MENISCUS: negative McMurray's -- PF JOINT: nml patellar mobility bilaterally  Hips: normal ROM  Bilateral Feet: Inspection:  Left leg shorter than right, Normal arch b/l Palpation: No tenderness to palpation b/l ROM: Full  ROM of the ankle b/l. Normal midfoot flexibility b/l Strength: 5/5 strength ankle in all planes b/l Neurovascular: N/V intact distally in the lower extremity b/l Special tests: Normal calcaneal motion with heel raise    No results found.   Assessment and Plan: 1) Degenerative tear of posterior horn of medial meniscus She has excellent ROM and strength  on examination and is very functional.  Overall doing well with HEP.  Provided reassurance and continue to encourage exercise and HEP.  Compression sleeve encouraged while hiking.  Leg length inequality Two new sets of sports insoles made for patient to use while in [place].  One set has left heel wedge, right 5th ray post, and bilateral small scaphoid pads to use in her Birkenstock Helene Shoe.  Another pair made identical to her previous pair with left heel wedge, right 5th ray post.  She ambulated well with both pairs and finds them comfortable. Follow up as needed.    Luis Abed, D.O.  PGY-4 [facility]  12/16/2020 7:56 PM  I observed and examined the patient with the [facility] resident and agree with assessment and plan.  Note reviewed and modified by me. Sterling Big, mD

## 2020-12-22 DIAGNOSIS — R69 Illness, unspecified: Secondary | ICD-10-CM | POA: Diagnosis not present

## 2021-01-12 DIAGNOSIS — F4323 Adjustment disorder with mixed anxiety and depressed mood: Secondary | ICD-10-CM | POA: Diagnosis not present

## 2021-01-12 DIAGNOSIS — R69 Illness, unspecified: Secondary | ICD-10-CM | POA: Diagnosis not present

## 2021-02-23 DIAGNOSIS — F4323 Adjustment disorder with mixed anxiety and depressed mood: Secondary | ICD-10-CM | POA: Diagnosis not present

## 2021-02-23 DIAGNOSIS — R69 Illness, unspecified: Secondary | ICD-10-CM | POA: Diagnosis not present

## 2021-03-09 DIAGNOSIS — F4323 Adjustment disorder with mixed anxiety and depressed mood: Secondary | ICD-10-CM | POA: Diagnosis not present

## 2021-03-09 DIAGNOSIS — R69 Illness, unspecified: Secondary | ICD-10-CM | POA: Diagnosis not present

## 2021-03-22 DIAGNOSIS — R69 Illness, unspecified: Secondary | ICD-10-CM | POA: Diagnosis not present

## 2021-03-22 DIAGNOSIS — F4323 Adjustment disorder with mixed anxiety and depressed mood: Secondary | ICD-10-CM | POA: Diagnosis not present

## 2021-04-04 DIAGNOSIS — H40013 Open angle with borderline findings, low risk, bilateral: Secondary | ICD-10-CM | POA: Diagnosis not present

## 2021-04-04 DIAGNOSIS — H5213 Myopia, bilateral: Secondary | ICD-10-CM | POA: Diagnosis not present

## 2021-04-07 DIAGNOSIS — R69 Illness, unspecified: Secondary | ICD-10-CM | POA: Diagnosis not present

## 2021-04-07 DIAGNOSIS — F4323 Adjustment disorder with mixed anxiety and depressed mood: Secondary | ICD-10-CM | POA: Diagnosis not present

## 2021-04-13 DIAGNOSIS — R69 Illness, unspecified: Secondary | ICD-10-CM | POA: Diagnosis not present

## 2021-04-13 DIAGNOSIS — F4323 Adjustment disorder with mixed anxiety and depressed mood: Secondary | ICD-10-CM | POA: Diagnosis not present

## 2021-04-27 DIAGNOSIS — F4323 Adjustment disorder with mixed anxiety and depressed mood: Secondary | ICD-10-CM | POA: Diagnosis not present

## 2021-04-27 DIAGNOSIS — R69 Illness, unspecified: Secondary | ICD-10-CM | POA: Diagnosis not present

## 2021-05-11 DIAGNOSIS — F4323 Adjustment disorder with mixed anxiety and depressed mood: Secondary | ICD-10-CM | POA: Diagnosis not present

## 2021-05-11 DIAGNOSIS — R69 Illness, unspecified: Secondary | ICD-10-CM | POA: Diagnosis not present

## 2021-06-01 DIAGNOSIS — R69 Illness, unspecified: Secondary | ICD-10-CM | POA: Diagnosis not present

## 2021-06-01 DIAGNOSIS — F4323 Adjustment disorder with mixed anxiety and depressed mood: Secondary | ICD-10-CM | POA: Diagnosis not present

## 2021-06-30 ENCOUNTER — Other Ambulatory Visit: Payer: Self-pay | Admitting: Family Medicine

## 2021-06-30 DIAGNOSIS — S069X0S Unspecified intracranial injury without loss of consciousness, sequela: Secondary | ICD-10-CM | POA: Diagnosis not present

## 2021-06-30 DIAGNOSIS — F4321 Adjustment disorder with depressed mood: Secondary | ICD-10-CM | POA: Diagnosis not present

## 2021-06-30 DIAGNOSIS — Z1211 Encounter for screening for malignant neoplasm of colon: Secondary | ICD-10-CM | POA: Diagnosis not present

## 2021-06-30 DIAGNOSIS — Z23 Encounter for immunization: Secondary | ICD-10-CM | POA: Diagnosis not present

## 2021-06-30 DIAGNOSIS — H9190 Unspecified hearing loss, unspecified ear: Secondary | ICD-10-CM | POA: Diagnosis not present

## 2021-06-30 DIAGNOSIS — M810 Age-related osteoporosis without current pathological fracture: Secondary | ICD-10-CM

## 2021-06-30 DIAGNOSIS — R69 Illness, unspecified: Secondary | ICD-10-CM | POA: Diagnosis not present

## 2021-06-30 DIAGNOSIS — Z0000 Encounter for general adult medical examination without abnormal findings: Secondary | ICD-10-CM | POA: Diagnosis not present

## 2021-06-30 DIAGNOSIS — E559 Vitamin D deficiency, unspecified: Secondary | ICD-10-CM | POA: Diagnosis not present

## 2021-07-06 DIAGNOSIS — R69 Illness, unspecified: Secondary | ICD-10-CM | POA: Diagnosis not present

## 2021-07-06 DIAGNOSIS — F4323 Adjustment disorder with mixed anxiety and depressed mood: Secondary | ICD-10-CM | POA: Diagnosis not present

## 2021-07-15 DIAGNOSIS — Z1211 Encounter for screening for malignant neoplasm of colon: Secondary | ICD-10-CM | POA: Diagnosis not present

## 2021-07-19 DIAGNOSIS — R69 Illness, unspecified: Secondary | ICD-10-CM | POA: Diagnosis not present

## 2021-07-19 DIAGNOSIS — F4323 Adjustment disorder with mixed anxiety and depressed mood: Secondary | ICD-10-CM | POA: Diagnosis not present

## 2021-07-26 DIAGNOSIS — F4323 Adjustment disorder with mixed anxiety and depressed mood: Secondary | ICD-10-CM | POA: Diagnosis not present

## 2021-07-26 DIAGNOSIS — R69 Illness, unspecified: Secondary | ICD-10-CM | POA: Diagnosis not present

## 2021-08-03 DIAGNOSIS — H9190 Unspecified hearing loss, unspecified ear: Secondary | ICD-10-CM | POA: Diagnosis not present

## 2021-08-10 DIAGNOSIS — F4323 Adjustment disorder with mixed anxiety and depressed mood: Secondary | ICD-10-CM | POA: Diagnosis not present

## 2021-08-10 DIAGNOSIS — R69 Illness, unspecified: Secondary | ICD-10-CM | POA: Diagnosis not present

## 2021-08-18 DIAGNOSIS — F4323 Adjustment disorder with mixed anxiety and depressed mood: Secondary | ICD-10-CM | POA: Diagnosis not present

## 2021-08-18 DIAGNOSIS — R69 Illness, unspecified: Secondary | ICD-10-CM | POA: Diagnosis not present

## 2021-09-26 DIAGNOSIS — F4323 Adjustment disorder with mixed anxiety and depressed mood: Secondary | ICD-10-CM | POA: Diagnosis not present

## 2021-09-26 DIAGNOSIS — R69 Illness, unspecified: Secondary | ICD-10-CM | POA: Diagnosis not present

## 2021-10-19 DIAGNOSIS — R69 Illness, unspecified: Secondary | ICD-10-CM | POA: Diagnosis not present

## 2021-10-19 DIAGNOSIS — F4323 Adjustment disorder with mixed anxiety and depressed mood: Secondary | ICD-10-CM | POA: Diagnosis not present

## 2021-11-02 DIAGNOSIS — R69 Illness, unspecified: Secondary | ICD-10-CM | POA: Diagnosis not present

## 2021-11-02 DIAGNOSIS — F4323 Adjustment disorder with mixed anxiety and depressed mood: Secondary | ICD-10-CM | POA: Diagnosis not present

## 2021-11-16 DIAGNOSIS — F4323 Adjustment disorder with mixed anxiety and depressed mood: Secondary | ICD-10-CM | POA: Diagnosis not present

## 2021-11-16 DIAGNOSIS — R69 Illness, unspecified: Secondary | ICD-10-CM | POA: Diagnosis not present

## 2021-11-25 ENCOUNTER — Ambulatory Visit
Admission: RE | Admit: 2021-11-25 | Discharge: 2021-11-25 | Disposition: A | Discharge status: Home / Self Care | Payer: Medicare HMO | Source: Ambulatory Visit | Attending: Family Medicine | Admitting: Family Medicine

## 2021-11-25 DIAGNOSIS — M810 Age-related osteoporosis without current pathological fracture: Secondary | ICD-10-CM | POA: Diagnosis not present

## 2021-11-25 DIAGNOSIS — Z780 Asymptomatic menopausal state: Secondary | ICD-10-CM | POA: Diagnosis not present

## 2021-11-25 DIAGNOSIS — M8588 Other specified disorders of bone density and structure, other site: Secondary | ICD-10-CM | POA: Diagnosis not present

## 2021-11-30 DIAGNOSIS — F4323 Adjustment disorder with mixed anxiety and depressed mood: Secondary | ICD-10-CM | POA: Diagnosis not present

## 2021-11-30 DIAGNOSIS — R69 Illness, unspecified: Secondary | ICD-10-CM | POA: Diagnosis not present

## 2021-12-06 ENCOUNTER — Ambulatory Visit: Payer: Medicare HMO | Admitting: Sports Medicine

## 2021-12-06 DIAGNOSIS — M79671 Pain in right foot: Secondary | ICD-10-CM

## 2021-12-07 DIAGNOSIS — M79671 Pain in right foot: Secondary | ICD-10-CM | POA: Insufficient documentation

## 2021-12-07 NOTE — Progress Notes (Signed)
   Established Patient Office Visit  Subjective   Patient ID: Annette Kidd, female    DOB: Feb 09, 1956  Age: 66 y.o. MRN: [id number]  Right lateral leg pain.  Annette Kidd is a 66 year old female that presents today with chief complaint of right-sided lateral leg pain.  She states that began approximately 1 month ago with no inciting event or trauma.  She does participate in daily walking between 3 to 5 miles daily on pavement.  On the weekends she does walks on the trails however none of these activities are new.  She does wear insoles and reports leg length discrepancy left leg shorter than right.  She denies any new footwear, falls, numbness tingling or radiation of her pain.  Her concern today is that her pain may result in return of her planter fasciitis.  She has a hiking trip planned for next month and would like to be checked out prior to that.    Objective:     BP (!) 118/54   Ht 5' 5.5" (1.664 m)   Wt 135 lb (61.2 kg)   BMI 22.12 kg/m   Physical Exam Vitals reviewed.  Constitutional:      General: She is not in acute distress.    Appearance: Normal appearance. She is normal weight. She is not ill-appearing, toxic-appearing or diaphoretic.  Pulmonary:     Effort: Pulmonary effort is normal.  Neurological:     Mental Status: She is alert.   Left lower extremity: No obvious deformity or asymmetry.  No ecchymosis or swelling.  Tenderness to palpation along the musculotendinous junction of the peroneals.  No tenderness to palpation at the base of the fifth metatarsal.  No tenderness to palpation at the insertion of plantar fascia.  Full range of motion plantarflexion, dorsiflexion, inversion and eversion.  Strength 5/5 plantar dorsiflexion, inversion and inversion.  No pain with these resisted activities.  No antalgic gait, slight supination with ambulation on the right.    Assessment & Plan:   Problem List Items Addressed This Visit       Other   Acute foot pain, right    I  suspect she has had to strain to her peroneals, will replace her insoles with right side lateral post and left heel lift, to assist with gait. She was given isometric exercises and stretches to be performed 1-2 times a day.       Return in about 2 months (around 02/05/2022), or sooner if symptoms worsen or fail to improve.    Claudie Leach, DO  I observed and examined the patient with the [facility] resident and agree with assessment and plan.  Note reviewed and modified by me. Sterling Big, MD

## 2021-12-07 NOTE — Assessment & Plan Note (Addendum)
I suspect she has had to strain to her peroneals, will replace her insoles with right side lateral post and left heel lift, to assist with gait. She was given isometric exercises and stretches to be performed 1-2 times a day.

## 2021-12-20 DIAGNOSIS — F4323 Adjustment disorder with mixed anxiety and depressed mood: Secondary | ICD-10-CM | POA: Diagnosis not present

## 2021-12-20 DIAGNOSIS — R69 Illness, unspecified: Secondary | ICD-10-CM | POA: Diagnosis not present

## 2022-02-01 DIAGNOSIS — F4323 Adjustment disorder with mixed anxiety and depressed mood: Secondary | ICD-10-CM | POA: Diagnosis not present

## 2022-02-01 DIAGNOSIS — R69 Illness, unspecified: Secondary | ICD-10-CM | POA: Diagnosis not present

## 2022-02-15 DIAGNOSIS — F4323 Adjustment disorder with mixed anxiety and depressed mood: Secondary | ICD-10-CM | POA: Diagnosis not present

## 2022-02-15 DIAGNOSIS — R69 Illness, unspecified: Secondary | ICD-10-CM | POA: Diagnosis not present

## 2022-02-16 DIAGNOSIS — H903 Sensorineural hearing loss, bilateral: Secondary | ICD-10-CM | POA: Diagnosis not present

## 2022-03-07 DIAGNOSIS — R69 Illness, unspecified: Secondary | ICD-10-CM | POA: Diagnosis not present

## 2022-03-07 DIAGNOSIS — F4323 Adjustment disorder with mixed anxiety and depressed mood: Secondary | ICD-10-CM | POA: Diagnosis not present

## 2022-03-15 DIAGNOSIS — F4323 Adjustment disorder with mixed anxiety and depressed mood: Secondary | ICD-10-CM | POA: Diagnosis not present

## 2022-03-15 DIAGNOSIS — R69 Illness, unspecified: Secondary | ICD-10-CM | POA: Diagnosis not present

## 2022-04-06 DIAGNOSIS — H40013 Open angle with borderline findings, low risk, bilateral: Secondary | ICD-10-CM | POA: Diagnosis not present

## 2022-04-06 DIAGNOSIS — F4323 Adjustment disorder with mixed anxiety and depressed mood: Secondary | ICD-10-CM | POA: Diagnosis not present

## 2022-04-06 DIAGNOSIS — H5213 Myopia, bilateral: Secondary | ICD-10-CM | POA: Diagnosis not present

## 2022-04-06 DIAGNOSIS — R69 Illness, unspecified: Secondary | ICD-10-CM | POA: Diagnosis not present

## 2022-04-11 DIAGNOSIS — K648 Other hemorrhoids: Secondary | ICD-10-CM | POA: Diagnosis not present

## 2022-04-11 DIAGNOSIS — K644 Residual hemorrhoidal skin tags: Secondary | ICD-10-CM | POA: Diagnosis not present

## 2022-04-11 DIAGNOSIS — Z1211 Encounter for screening for malignant neoplasm of colon: Secondary | ICD-10-CM | POA: Diagnosis not present

## 2022-04-18 ENCOUNTER — Ambulatory Visit: Payer: Medicare HMO | Admitting: Sports Medicine

## 2022-04-18 VITALS — BP 120/82 | Ht 66.0 in | Wt 135.0 lb

## 2022-04-18 DIAGNOSIS — S76011A Strain of muscle, fascia and tendon of right hip, initial encounter: Secondary | ICD-10-CM | POA: Diagnosis not present

## 2022-04-18 DIAGNOSIS — F4323 Adjustment disorder with mixed anxiety and depressed mood: Secondary | ICD-10-CM | POA: Diagnosis not present

## 2022-04-18 DIAGNOSIS — R69 Illness, unspecified: Secondary | ICD-10-CM | POA: Diagnosis not present

## 2022-04-18 NOTE — Assessment & Plan Note (Signed)
She has likely strained her piriformis muscle either body pump or with her upon activities.  We reviewed with her exercises and stretches that she should be doing.  She is to use pain as her guide.  Return to clinic she is to use pain as her guide.  Return to clinic if she has no improvement in the next 4 to 6 weeks and we can consider steroid injection for pain so that she may complete the exercise program.

## 2022-04-18 NOTE — Progress Notes (Signed)
   Established Patient Office Visit  Subjective   Patient ID: Annette Kidd, female    DOB: 07-25-1955  Age: 66 y.o. MRN: [id number]  Bilateral hip pain.  Presents today with chief complaint of bilateral hip and back pain for about 1 month the left.  Her pain has not been worsening however just not improving with occasional ibuprofen.  She does have some pain in her groin as well as in her buttocks.  She restarted body pump at the [facility] in October after taking about a month off.  She thought maybe this contributed to her pain so she discontinued it last week but has not noticed any change in her pain.  She denies any acute injury or falls since her pain began.  He denies any radiation of her pain down her legs or numbness and tingling.  Per my review of history most of her pain is on the right and paricularly worsens with using a shovel, certain moves in body pump, and rotating the hip. (KBF)  ROS as listed above in HPI    Objective:     BP 120/82   Ht 5\' 6"  (1.676 m)   Wt 135 lb (61.2 kg)   BMI 21.79 kg/m   Physical Exam Vitals reviewed.  Constitutional:      General: She is not in acute distress.    Appearance: Normal appearance. She is normal weight. She is not ill-appearing, toxic-appearing or diaphoretic.  Pulmonary:     Effort: Pulmonary effort is normal.  Neurological:     Mental Status: She is alert.   Bilateral hips: No obvious deformity or asymmetry.  No tenderness to palpation over the lateral aspect, greater trochanter.  Negative logroll.  No tenderness to palpation at the SI joint. Right hip: Full range of motion with flexion internal rotation.  Some discomfort in the buttocks region at the extreme of external rotation.  Tenderness to palpation in the posterior buttocks, piriformis area.  No tenderness to palpation at the ischial tuberosity or hamstring origin.  Weakness and pain with resisted hip abduction Left hip: Full range of motion with flexion, internal and  external rotation.    Assessment & Plan:   Problem List Items Addressed This Visit       Musculoskeletal and Integument   Muscle strain of right gluteal region - Primary    She has likely strained her piriformis muscle either body pump or with her upon activities.  We reviewed with her exercises and stretches that she should be doing.  She is to use pain as her guide.  Return to clinic she is to use pain as her guide.  Return to clinic if she has no improvement in the next 4 to 6 weeks and we can consider steroid injection for pain so that she may complete the exercise program.       Return if symptoms worsen or fail to improve.    , DO  I observed and examined the patient with the [facility] resident and agree with assessment and plan.  Note reviewed and modified by me. [facility], MD

## 2022-04-18 NOTE — Patient Instructions (Signed)
Over the next couple weeks work on limiting weighted lunges to the side at body pump.  Let pain be your guide. Exercises and stretches I would like you to preform, lateral leg raise for couple of weeks with no weight then you may try an ankle weight, standing hip rotation, figure 4 your pharmacy stretch and spinal stretch.  You may also roll on a spiky ball. Continue to do these exercises into your pain has subsided.  Follow-up if no improvement in your symptoms and we can consider cortisone injection to the area.

## 2022-05-14 DIAGNOSIS — R69 Illness, unspecified: Secondary | ICD-10-CM | POA: Diagnosis not present

## 2022-05-16 DIAGNOSIS — R69 Illness, unspecified: Secondary | ICD-10-CM | POA: Diagnosis not present

## 2022-05-17 DIAGNOSIS — R69 Illness, unspecified: Secondary | ICD-10-CM | POA: Diagnosis not present

## 2022-05-17 DIAGNOSIS — F4323 Adjustment disorder with mixed anxiety and depressed mood: Secondary | ICD-10-CM | POA: Diagnosis not present

## 2022-05-30 ENCOUNTER — Ambulatory Visit
Admission: RE | Admit: 2022-05-30 | Discharge: 2022-05-30 | Disposition: A | Discharge status: Home / Self Care | Payer: Medicare HMO | Source: Ambulatory Visit | Attending: Sports Medicine | Admitting: Sports Medicine

## 2022-05-30 ENCOUNTER — Ambulatory Visit: Payer: Medicare HMO | Admitting: Sports Medicine

## 2022-05-30 VITALS — BP 132/76 | Ht 66.0 in | Wt 135.0 lb

## 2022-05-30 DIAGNOSIS — S76011D Strain of muscle, fascia and tendon of right hip, subsequent encounter: Secondary | ICD-10-CM | POA: Diagnosis not present

## 2022-05-30 DIAGNOSIS — M545 Low back pain, unspecified: Secondary | ICD-10-CM

## 2022-05-30 MED ORDER — METHYLPREDNISOLONE ACETATE 40 MG/ML IJ SUSP
40.0000 mg | Freq: Once | INTRAMUSCULAR | Status: AC
  Administered 2022-05-30: 40 mg via INTRAMUSCULAR

## 2022-05-30 NOTE — Patient Instructions (Signed)
Still suspicious that your pain is coming from a strain on your gluteal muscles.  Today we did give you a steroid injection into that tender point.  Recommend taking it easy for the next 1 to 2 days to avoid any bleeding or strenuous activity.  Then you may return to your regular activities.  If your pain does not subside I would take the order for the x-ray of your lumbar spine and have that completed.  We will follow-up with you in 4 weeks.

## 2022-05-30 NOTE — Progress Notes (Addendum)
Established Patient Office Visit  Subjective   Patient ID: Annette Kidd, female    DOB: 29-Mar-1956  Age: 67 y.o. MRN: [id number]  Follow-up bilateral hip/back pain.  Annette Kidd presents today for follow-up of bilateral hip and low back pain right greater than left.  She has been performing the exercises we gave her for the past 6 weeks, attending yoga and her regular walking.  She reports she has not noticed much improvement of her pain.  Her pain is mainly located in the posterior aspect of her buttocks and sometimes she has radiating pain into her groin.  She noticed this pain was much greater starting this fall may be exacerbated by yard work.  She is able to do her normal hiking routine.  She has noticed when she takes a milligrams of ibuprofen which she does rarely she gets relief from her pain.  She denies any pain radiating down into her legs or any numbness and tingling.   ROS as listed above in HPI    Objective:     BP 132/76   Ht 5\' 6"  (1.676 m)   Wt 135 lb (61.2 kg)   BMI 21.79 kg/m   Physical Exam Vitals reviewed.  Constitutional:      General: She is not in acute distress.    Appearance: Normal appearance. She is normal weight. She is not ill-appearing, toxic-appearing or diaphoretic.  Pulmonary:     Effort: Pulmonary effort is normal.  Neurological:     Mental Status: She is alert.   Right hip: No obvious deformity or asymmetry.  She has full range of motion with flexion and internal/external rotation, no pain with these activities either.  Maximal point of tenderness to palpation is middle lateral gluteus area, over the piriformis muscle.  Negative logroll.  Normal gait.     Assessment & Plan:   Problem List Items Addressed This Visit       Musculoskeletal and Integument   Muscle strain of right gluteal region    Patient has had little to no improvement with 6 weeks of exercises for piriformis strain.  Discussed her bilateral pain could be stemming from  some degenerative changes of her back however more suspicious at this time still for gluteal strain and hip pathology or lumbar spine pathology.  Intramuscular 40 mg Depo-Medrol administered to the tender point in her right gluteal region.  Discussed with patient to take it easy for the next couple of days she may continue with walking but nothing strenuous or weighted.  If her pain persist with no change recommend completing the order for x-ray of her lumbar spine.  We will follow-up with her in 4 weeks      Other Visit Diagnoses     Acute midline low back pain without sciatica    -  Primary   Relevant Medications   methylPREDNISolone acetate (DEPO-MEDROL) injection 40 mg (Completed)   Other Relevant Orders   DG Lumbar Spine 2-3 Views      After obtaining verbal consent patient skin was cleansed with alcohol and ethyl chloride cold spray was applied, I injected into right gluteal tender point near the origin of the piriformis muscle with 40 mL of Depo-Medrol intramuscularly.  She tolerated this procedure well Band-Aid was applied.   Return in about 4 weeks (around 06/27/2022).    Elmore Guise, DO  I observed and examined the patient with the [facility] resident and agree with assessment and plan.  Note reviewed and modified  by me. Ila Mcgill, MD  Addendum:  Called patient to share results of LS spine XR.  Her degenerative disc disease likely plays a role in her symptoms and will suggest a preventive HEP.  KB Fields,MD  06/06/22

## 2022-05-30 NOTE — Assessment & Plan Note (Signed)
Patient has had little to no improvement with 6 weeks of exercises for piriformis strain.  Discussed her bilateral pain could be stemming from some degenerative changes of her back however more suspicious at this time still for gluteal strain and hip pathology or lumbar spine pathology.  Intramuscular 40 mg Depo-Medrol administered to the tender point in her right gluteal region.  Discussed with patient to take it easy for the next couple of days she may continue with walking but nothing strenuous or weighted.  If her pain persist with no change recommend completing the order for x-ray of her lumbar spine.  We will follow-up with her in 4 weeks

## 2022-05-31 DIAGNOSIS — R69 Illness, unspecified: Secondary | ICD-10-CM | POA: Diagnosis not present

## 2022-05-31 DIAGNOSIS — F4323 Adjustment disorder with mixed anxiety and depressed mood: Secondary | ICD-10-CM | POA: Diagnosis not present

## 2022-06-06 DIAGNOSIS — Z0100 Encounter for examination of eyes and vision without abnormal findings: Secondary | ICD-10-CM | POA: Diagnosis not present

## 2022-06-14 DIAGNOSIS — R69 Illness, unspecified: Secondary | ICD-10-CM | POA: Diagnosis not present

## 2022-06-14 DIAGNOSIS — F4323 Adjustment disorder with mixed anxiety and depressed mood: Secondary | ICD-10-CM | POA: Diagnosis not present

## 2022-06-15 ENCOUNTER — Encounter: Payer: Self-pay | Admitting: Sports Medicine

## 2022-06-29 DIAGNOSIS — F4323 Adjustment disorder with mixed anxiety and depressed mood: Secondary | ICD-10-CM | POA: Diagnosis not present

## 2022-06-29 DIAGNOSIS — R69 Illness, unspecified: Secondary | ICD-10-CM | POA: Diagnosis not present

## 2022-07-03 DIAGNOSIS — R69 Illness, unspecified: Secondary | ICD-10-CM | POA: Diagnosis not present

## 2022-07-03 DIAGNOSIS — E559 Vitamin D deficiency, unspecified: Secondary | ICD-10-CM | POA: Diagnosis not present

## 2022-07-03 DIAGNOSIS — E7800 Pure hypercholesterolemia, unspecified: Secondary | ICD-10-CM | POA: Diagnosis not present

## 2022-07-03 DIAGNOSIS — Z0000 Encounter for general adult medical examination without abnormal findings: Secondary | ICD-10-CM | POA: Diagnosis not present

## 2022-07-03 DIAGNOSIS — Z23 Encounter for immunization: Secondary | ICD-10-CM | POA: Diagnosis not present

## 2022-07-03 DIAGNOSIS — M810 Age-related osteoporosis without current pathological fracture: Secondary | ICD-10-CM | POA: Diagnosis not present

## 2022-07-03 DIAGNOSIS — Z7184 Encounter for health counseling related to travel: Secondary | ICD-10-CM | POA: Diagnosis not present

## 2022-07-03 DIAGNOSIS — H9190 Unspecified hearing loss, unspecified ear: Secondary | ICD-10-CM | POA: Diagnosis not present

## 2022-07-03 DIAGNOSIS — M5136 Other intervertebral disc degeneration, lumbar region: Secondary | ICD-10-CM | POA: Diagnosis not present

## 2022-07-03 DIAGNOSIS — S069X0S Unspecified intracranial injury without loss of consciousness, sequela: Secondary | ICD-10-CM | POA: Diagnosis not present

## 2022-07-05 DIAGNOSIS — M810 Age-related osteoporosis without current pathological fracture: Secondary | ICD-10-CM | POA: Diagnosis not present

## 2022-07-05 DIAGNOSIS — E559 Vitamin D deficiency, unspecified: Secondary | ICD-10-CM | POA: Diagnosis not present

## 2022-07-05 DIAGNOSIS — Z0000 Encounter for general adult medical examination without abnormal findings: Secondary | ICD-10-CM | POA: Diagnosis not present

## 2022-07-05 DIAGNOSIS — E7800 Pure hypercholesterolemia, unspecified: Secondary | ICD-10-CM | POA: Diagnosis not present

## 2022-07-06 DIAGNOSIS — F4323 Adjustment disorder with mixed anxiety and depressed mood: Secondary | ICD-10-CM | POA: Diagnosis not present

## 2022-07-06 DIAGNOSIS — R69 Illness, unspecified: Secondary | ICD-10-CM | POA: Diagnosis not present

## 2022-07-17 DIAGNOSIS — Z86018 Personal history of other benign neoplasm: Secondary | ICD-10-CM | POA: Diagnosis not present

## 2022-07-17 DIAGNOSIS — L578 Other skin changes due to chronic exposure to nonionizing radiation: Secondary | ICD-10-CM | POA: Diagnosis not present

## 2022-07-17 DIAGNOSIS — L814 Other melanin hyperpigmentation: Secondary | ICD-10-CM | POA: Diagnosis not present

## 2022-07-17 DIAGNOSIS — D225 Melanocytic nevi of trunk: Secondary | ICD-10-CM | POA: Diagnosis not present

## 2022-07-17 DIAGNOSIS — L821 Other seborrheic keratosis: Secondary | ICD-10-CM | POA: Diagnosis not present

## 2022-07-18 DIAGNOSIS — R69 Illness, unspecified: Secondary | ICD-10-CM | POA: Diagnosis not present

## 2022-07-18 DIAGNOSIS — F4323 Adjustment disorder with mixed anxiety and depressed mood: Secondary | ICD-10-CM | POA: Diagnosis not present

## 2022-08-01 DIAGNOSIS — F4323 Adjustment disorder with mixed anxiety and depressed mood: Secondary | ICD-10-CM | POA: Diagnosis not present

## 2022-08-01 DIAGNOSIS — Z23 Encounter for immunization: Secondary | ICD-10-CM | POA: Diagnosis not present

## 2022-08-01 DIAGNOSIS — R69 Illness, unspecified: Secondary | ICD-10-CM | POA: Diagnosis not present

## 2022-08-02 DIAGNOSIS — R69 Illness, unspecified: Secondary | ICD-10-CM | POA: Diagnosis not present

## 2022-08-17 DIAGNOSIS — R69 Illness, unspecified: Secondary | ICD-10-CM | POA: Diagnosis not present

## 2022-08-17 DIAGNOSIS — F4323 Adjustment disorder with mixed anxiety and depressed mood: Secondary | ICD-10-CM | POA: Diagnosis not present

## 2022-08-31 DIAGNOSIS — Z23 Encounter for immunization: Secondary | ICD-10-CM | POA: Diagnosis not present

## 2022-09-25 DIAGNOSIS — R69 Illness, unspecified: Secondary | ICD-10-CM | POA: Diagnosis not present

## 2022-10-18 DIAGNOSIS — F4323 Adjustment disorder with mixed anxiety and depressed mood: Secondary | ICD-10-CM | POA: Diagnosis not present

## 2022-10-26 DIAGNOSIS — F4323 Adjustment disorder with mixed anxiety and depressed mood: Secondary | ICD-10-CM | POA: Diagnosis not present

## 2022-11-16 DIAGNOSIS — F4323 Adjustment disorder with mixed anxiety and depressed mood: Secondary | ICD-10-CM | POA: Diagnosis not present

## 2022-11-20 ENCOUNTER — Other Ambulatory Visit: Payer: Self-pay | Admitting: Internal Medicine

## 2022-11-20 DIAGNOSIS — Z1231 Encounter for screening mammogram for malignant neoplasm of breast: Secondary | ICD-10-CM

## 2022-11-21 ENCOUNTER — Ambulatory Visit
Admission: RE | Admit: 2022-11-21 | Discharge: 2022-11-21 | Disposition: A | Discharge status: Home / Self Care | Payer: Medicare HMO | Source: Ambulatory Visit | Attending: Internal Medicine | Admitting: Internal Medicine

## 2022-11-21 DIAGNOSIS — Z1231 Encounter for screening mammogram for malignant neoplasm of breast: Secondary | ICD-10-CM

## 2022-11-29 DIAGNOSIS — F4323 Adjustment disorder with mixed anxiety and depressed mood: Secondary | ICD-10-CM | POA: Diagnosis not present

## 2022-11-30 ENCOUNTER — Ambulatory Visit: Payer: Medicare HMO

## 2022-12-14 DIAGNOSIS — F4323 Adjustment disorder with mixed anxiety and depressed mood: Secondary | ICD-10-CM | POA: Diagnosis not present

## 2022-12-27 DIAGNOSIS — F4323 Adjustment disorder with mixed anxiety and depressed mood: Secondary | ICD-10-CM | POA: Diagnosis not present

## 2023-01-04 DIAGNOSIS — R69 Illness, unspecified: Secondary | ICD-10-CM | POA: Diagnosis not present

## 2023-01-10 DIAGNOSIS — F4323 Adjustment disorder with mixed anxiety and depressed mood: Secondary | ICD-10-CM | POA: Diagnosis not present

## 2023-01-25 DIAGNOSIS — F4323 Adjustment disorder with mixed anxiety and depressed mood: Secondary | ICD-10-CM | POA: Diagnosis not present

## 2023-02-01 DIAGNOSIS — Z23 Encounter for immunization: Secondary | ICD-10-CM | POA: Diagnosis not present

## 2023-02-07 DIAGNOSIS — F4323 Adjustment disorder with mixed anxiety and depressed mood: Secondary | ICD-10-CM | POA: Diagnosis not present

## 2023-02-28 DIAGNOSIS — F4323 Adjustment disorder with mixed anxiety and depressed mood: Secondary | ICD-10-CM | POA: Diagnosis not present

## 2023-03-08 DIAGNOSIS — F4323 Adjustment disorder with mixed anxiety and depressed mood: Secondary | ICD-10-CM | POA: Diagnosis not present

## 2023-03-21 DIAGNOSIS — R69 Illness, unspecified: Secondary | ICD-10-CM | POA: Diagnosis not present

## 2023-03-21 DIAGNOSIS — F4323 Adjustment disorder with mixed anxiety and depressed mood: Secondary | ICD-10-CM | POA: Diagnosis not present

## 2023-03-29 DIAGNOSIS — F4323 Adjustment disorder with mixed anxiety and depressed mood: Secondary | ICD-10-CM | POA: Diagnosis not present

## 2023-04-09 DIAGNOSIS — H5213 Myopia, bilateral: Secondary | ICD-10-CM | POA: Diagnosis not present

## 2023-04-09 DIAGNOSIS — H2513 Age-related nuclear cataract, bilateral: Secondary | ICD-10-CM | POA: Diagnosis not present

## 2023-04-11 DIAGNOSIS — F4323 Adjustment disorder with mixed anxiety and depressed mood: Secondary | ICD-10-CM | POA: Diagnosis not present

## 2023-04-18 DIAGNOSIS — R69 Illness, unspecified: Secondary | ICD-10-CM | POA: Diagnosis not present

## 2023-04-25 DIAGNOSIS — F4323 Adjustment disorder with mixed anxiety and depressed mood: Secondary | ICD-10-CM | POA: Diagnosis not present

## 2023-05-15 DIAGNOSIS — F4323 Adjustment disorder with mixed anxiety and depressed mood: Secondary | ICD-10-CM | POA: Diagnosis not present

## 2023-07-16 DIAGNOSIS — R251 Tremor, unspecified: Secondary | ICD-10-CM | POA: Diagnosis not present

## 2023-07-16 DIAGNOSIS — M810 Age-related osteoporosis without current pathological fracture: Secondary | ICD-10-CM | POA: Diagnosis not present

## 2023-07-16 DIAGNOSIS — Z0000 Encounter for general adult medical examination without abnormal findings: Secondary | ICD-10-CM | POA: Diagnosis not present

## 2023-07-16 DIAGNOSIS — E7800 Pure hypercholesterolemia, unspecified: Secondary | ICD-10-CM | POA: Diagnosis not present

## 2023-07-16 DIAGNOSIS — M5136 Other intervertebral disc degeneration, lumbar region with discogenic back pain only: Secondary | ICD-10-CM | POA: Diagnosis not present

## 2023-07-16 DIAGNOSIS — F329 Major depressive disorder, single episode, unspecified: Secondary | ICD-10-CM | POA: Diagnosis not present

## 2023-07-16 DIAGNOSIS — S069X0S Unspecified intracranial injury without loss of consciousness, sequela: Secondary | ICD-10-CM | POA: Diagnosis not present

## 2023-07-16 DIAGNOSIS — E559 Vitamin D deficiency, unspecified: Secondary | ICD-10-CM | POA: Diagnosis not present

## 2023-07-16 DIAGNOSIS — H9190 Unspecified hearing loss, unspecified ear: Secondary | ICD-10-CM | POA: Diagnosis not present

## 2023-08-07 DIAGNOSIS — Z86018 Personal history of other benign neoplasm: Secondary | ICD-10-CM | POA: Diagnosis not present

## 2023-08-07 DIAGNOSIS — S30860A Insect bite (nonvenomous) of lower back and pelvis, initial encounter: Secondary | ICD-10-CM | POA: Diagnosis not present

## 2023-08-07 DIAGNOSIS — W57XXXA Bitten or stung by nonvenomous insect and other nonvenomous arthropods, initial encounter: Secondary | ICD-10-CM | POA: Diagnosis not present

## 2023-08-07 DIAGNOSIS — L814 Other melanin hyperpigmentation: Secondary | ICD-10-CM | POA: Diagnosis not present

## 2023-08-07 DIAGNOSIS — L578 Other skin changes due to chronic exposure to nonionizing radiation: Secondary | ICD-10-CM | POA: Diagnosis not present

## 2023-08-07 DIAGNOSIS — L821 Other seborrheic keratosis: Secondary | ICD-10-CM | POA: Diagnosis not present

## 2023-08-07 DIAGNOSIS — D225 Melanocytic nevi of trunk: Secondary | ICD-10-CM | POA: Diagnosis not present

## 2023-09-12 DIAGNOSIS — F4323 Adjustment disorder with mixed anxiety and depressed mood: Secondary | ICD-10-CM | POA: Diagnosis not present

## 2023-09-13 DIAGNOSIS — M79643 Pain in unspecified hand: Secondary | ICD-10-CM | POA: Diagnosis not present

## 2023-09-26 DIAGNOSIS — F4323 Adjustment disorder with mixed anxiety and depressed mood: Secondary | ICD-10-CM | POA: Diagnosis not present

## 2023-10-10 DIAGNOSIS — F4323 Adjustment disorder with mixed anxiety and depressed mood: Secondary | ICD-10-CM | POA: Diagnosis not present

## 2023-10-23 ENCOUNTER — Other Ambulatory Visit: Payer: Self-pay | Admitting: Internal Medicine

## 2023-10-23 ENCOUNTER — Other Ambulatory Visit (HOSPITAL_BASED_OUTPATIENT_CLINIC_OR_DEPARTMENT_OTHER): Payer: Self-pay | Admitting: Internal Medicine

## 2023-10-23 DIAGNOSIS — Z1231 Encounter for screening mammogram for malignant neoplasm of breast: Secondary | ICD-10-CM

## 2023-10-23 DIAGNOSIS — M810 Age-related osteoporosis without current pathological fracture: Secondary | ICD-10-CM

## 2023-10-23 DIAGNOSIS — F4323 Adjustment disorder with mixed anxiety and depressed mood: Secondary | ICD-10-CM | POA: Diagnosis not present

## 2023-12-19 ENCOUNTER — Ambulatory Visit
Admission: RE | Admit: 2023-12-19 | Discharge: 2023-12-19 | Disposition: A | Discharge status: Home / Self Care | Source: Ambulatory Visit | Attending: Internal Medicine | Admitting: Internal Medicine

## 2023-12-19 DIAGNOSIS — Z1231 Encounter for screening mammogram for malignant neoplasm of breast: Secondary | ICD-10-CM | POA: Diagnosis not present

## 2023-12-25 DIAGNOSIS — F4323 Adjustment disorder with mixed anxiety and depressed mood: Secondary | ICD-10-CM | POA: Diagnosis not present

## 2024-01-09 DIAGNOSIS — F4323 Adjustment disorder with mixed anxiety and depressed mood: Secondary | ICD-10-CM | POA: Diagnosis not present

## 2024-01-24 DIAGNOSIS — F4323 Adjustment disorder with mixed anxiety and depressed mood: Secondary | ICD-10-CM | POA: Diagnosis not present

## 2024-02-07 DIAGNOSIS — F4323 Adjustment disorder with mixed anxiety and depressed mood: Secondary | ICD-10-CM | POA: Diagnosis not present

## 2024-02-18 DIAGNOSIS — H903 Sensorineural hearing loss, bilateral: Secondary | ICD-10-CM | POA: Diagnosis not present

## 2024-02-27 DIAGNOSIS — F4323 Adjustment disorder with mixed anxiety and depressed mood: Secondary | ICD-10-CM | POA: Diagnosis not present

## 2024-03-13 DIAGNOSIS — F4323 Adjustment disorder with mixed anxiety and depressed mood: Secondary | ICD-10-CM | POA: Diagnosis not present

## 2024-03-27 DIAGNOSIS — F4323 Adjustment disorder with mixed anxiety and depressed mood: Secondary | ICD-10-CM | POA: Diagnosis not present

## 2024-04-09 DIAGNOSIS — H04123 Dry eye syndrome of bilateral lacrimal glands: Secondary | ICD-10-CM | POA: Diagnosis not present

## 2024-04-09 DIAGNOSIS — H5213 Myopia, bilateral: Secondary | ICD-10-CM | POA: Diagnosis not present

## 2024-04-09 DIAGNOSIS — H43813 Vitreous degeneration, bilateral: Secondary | ICD-10-CM | POA: Diagnosis not present

## 2024-06-04 ENCOUNTER — Ambulatory Visit

## 2024-06-04 DIAGNOSIS — M810 Age-related osteoporosis without current pathological fracture: Secondary | ICD-10-CM
